# Patient Record
Sex: Female | Born: 1945 | Race: White | Hispanic: No | Marital: Married | Smoking: Never smoker
Health system: Southern US, Community
[De-identification: ages and names within clinical notes are randomized; demographics above are authoritative.]

## PROBLEM LIST (undated history)

## (undated) DIAGNOSIS — F329 Major depressive disorder, single episode, unspecified: Secondary | ICD-10-CM

## (undated) DIAGNOSIS — F32A Depression, unspecified: Secondary | ICD-10-CM

## (undated) DIAGNOSIS — I10 Essential (primary) hypertension: Secondary | ICD-10-CM

## (undated) DIAGNOSIS — F419 Anxiety disorder, unspecified: Secondary | ICD-10-CM

## (undated) DIAGNOSIS — K635 Polyp of colon: Secondary | ICD-10-CM

## (undated) DIAGNOSIS — E785 Hyperlipidemia, unspecified: Secondary | ICD-10-CM

## (undated) DIAGNOSIS — M8580 Other specified disorders of bone density and structure, unspecified site: Secondary | ICD-10-CM

## (undated) DIAGNOSIS — L719 Rosacea, unspecified: Secondary | ICD-10-CM

## (undated) DIAGNOSIS — G250 Essential tremor: Secondary | ICD-10-CM

## (undated) HISTORY — DX: Depression, unspecified: F32.A

## (undated) HISTORY — DX: Essential (primary) hypertension: I10

## (undated) HISTORY — PX: APPENDECTOMY: SHX54

## (undated) HISTORY — DX: Hyperlipidemia, unspecified: E78.5

## (undated) HISTORY — DX: Other specified disorders of bone density and structure, unspecified site: M85.80

## (undated) HISTORY — DX: Essential tremor: G25.0

## (undated) HISTORY — DX: Rosacea, unspecified: L71.9

## (undated) HISTORY — DX: Anxiety disorder, unspecified: F41.9

## (undated) HISTORY — PX: POLYPECTOMY: SHX149

## (undated) HISTORY — DX: Polyp of colon: K63.5

## (undated) HISTORY — DX: Major depressive disorder, single episode, unspecified: F32.9

## (undated) HISTORY — PX: TUBAL LIGATION: SHX77

---

## 2000-02-18 ENCOUNTER — Other Ambulatory Visit: Admission: RE | Admit: 2000-02-18 | Discharge: 2000-02-18 | Payer: Self-pay | Admitting: Family Medicine

## 2000-12-07 ENCOUNTER — Other Ambulatory Visit: Admission: RE | Admit: 2000-12-07 | Discharge: 2000-12-07 | Payer: Self-pay | Admitting: Family Medicine

## 2002-04-18 ENCOUNTER — Other Ambulatory Visit: Admission: RE | Admit: 2002-04-18 | Discharge: 2002-04-18 | Payer: Self-pay | Admitting: Family Medicine

## 2002-06-04 ENCOUNTER — Encounter (INDEPENDENT_AMBULATORY_CARE_PROVIDER_SITE_OTHER): Payer: Self-pay | Admitting: Specialist

## 2002-06-04 ENCOUNTER — Inpatient Hospital Stay (HOSPITAL_COMMUNITY): Admission: EM | Admit: 2002-06-04 | Discharge: 2002-06-11 | Payer: Self-pay | Admitting: Emergency Medicine

## 2002-06-04 ENCOUNTER — Encounter: Payer: Self-pay | Admitting: Emergency Medicine

## 2003-06-19 ENCOUNTER — Other Ambulatory Visit: Admission: RE | Admit: 2003-06-19 | Discharge: 2003-06-19 | Payer: Self-pay | Admitting: Family Medicine

## 2004-05-12 ENCOUNTER — Other Ambulatory Visit: Admission: RE | Admit: 2004-05-12 | Discharge: 2004-05-12 | Payer: Self-pay | Admitting: Family Medicine

## 2005-02-16 ENCOUNTER — Other Ambulatory Visit: Admission: RE | Admit: 2005-02-16 | Discharge: 2005-02-16 | Payer: Self-pay | Admitting: Family Medicine

## 2005-02-16 ENCOUNTER — Ambulatory Visit: Payer: Self-pay | Admitting: Family Medicine

## 2005-03-25 ENCOUNTER — Ambulatory Visit: Payer: Self-pay | Admitting: Family Medicine

## 2005-04-13 ENCOUNTER — Ambulatory Visit: Payer: Self-pay

## 2005-12-31 ENCOUNTER — Ambulatory Visit: Payer: Self-pay | Admitting: Internal Medicine

## 2007-02-22 ENCOUNTER — Ambulatory Visit: Payer: Self-pay | Admitting: Internal Medicine

## 2007-02-22 ENCOUNTER — Encounter: Payer: Self-pay | Admitting: Internal Medicine

## 2007-02-22 ENCOUNTER — Other Ambulatory Visit: Admission: RE | Admit: 2007-02-22 | Discharge: 2007-02-22 | Payer: Self-pay | Admitting: Internal Medicine

## 2007-02-22 DIAGNOSIS — L719 Rosacea, unspecified: Secondary | ICD-10-CM | POA: Insufficient documentation

## 2007-02-22 DIAGNOSIS — G250 Essential tremor: Secondary | ICD-10-CM

## 2007-02-22 DIAGNOSIS — F329 Major depressive disorder, single episode, unspecified: Secondary | ICD-10-CM

## 2007-02-22 LAB — CONVERTED CEMR LAB
ALT: 18 units/L (ref 0–40)
AST: 25 units/L (ref 0–37)
Albumin: 4.1 g/dL (ref 3.5–5.2)
Alkaline Phosphatase: 81 units/L (ref 39–117)
BUN: 13 mg/dL (ref 6–23)
Basophils Relative: 1 % (ref 0.0–1.0)
Bilirubin Urine: NEGATIVE
Bilirubin, Direct: 0.1 mg/dL (ref 0.0–0.3)
CO2: 32 meq/L (ref 19–32)
Calcium: 9.8 mg/dL (ref 8.4–10.5)
Cholesterol: 229 mg/dL (ref 0–200)
Creatinine, Ser: 0.7 mg/dL (ref 0.4–1.2)
Direct LDL: 153.1 mg/dL
Eosinophils Absolute: 0.1 10*3/uL (ref 0.0–0.6)
Eosinophils Relative: 2 % (ref 0.0–5.0)
GFR calc Af Amer: 110 mL/min
GFR calc non Af Amer: 91 mL/min
Glucose, Bld: 83 mg/dL (ref 70–99)
HCT: 36.5 % (ref 36.0–46.0)
HDL: 57.9 mg/dL (ref >39.0)
Hemoglobin, Urine: NEGATIVE
Ketones, ur: NEGATIVE mg/dL
Leukocytes, UA: NEGATIVE
Lymphocytes Relative: 26.6 % (ref 12.0–46.0)
MCHC: 34.7 g/dL (ref 30.0–36.0)
MCV: 84.5 fL (ref 78.0–100.0)
Monocytes Relative: 9.4 % (ref 3.0–11.0)
Neutro Abs: 2.6 10*3/uL (ref 1.4–7.7)
Neutrophils Relative %: 61 % (ref 43.0–77.0)
Nitrite: NEGATIVE
Platelets: 278 10*3/uL (ref 150–400)
Protein, ur: NEGATIVE mg/dL
RDW: 11.7 % (ref 11.5–14.6)
Sodium: 143 meq/L (ref 135–145)
Specific Gravity, Urine: 1.014 (ref 1.005–1.03)
TSH: 1.48 microintl units/mL (ref 0.35–5.50)
Total Bilirubin: 0.9 mg/dL (ref 0.3–1.2)
Triglycerides: 100 mg/dL (ref 0–149)
Urine Glucose: NEGATIVE mg/dL
Urobilinogen, UA: 0.2 (ref 0.0–1.0)
VLDL: 20 mg/dL (ref 0–40)
WBC, UA: NONE SEEN cells/hpf (ref <3)
WBC: 4.2 10*3/uL — ABNORMAL LOW (ref 4.5–10.5)
pH: 6.5 (ref 5.0–8.0)

## 2007-03-31 ENCOUNTER — Encounter: Payer: Self-pay | Admitting: Internal Medicine

## 2007-04-28 ENCOUNTER — Encounter: Payer: Self-pay | Admitting: Internal Medicine

## 2008-06-20 ENCOUNTER — Encounter: Payer: Self-pay | Admitting: Internal Medicine

## 2008-07-04 ENCOUNTER — Encounter (INDEPENDENT_AMBULATORY_CARE_PROVIDER_SITE_OTHER): Payer: Self-pay | Admitting: *Deleted

## 2008-07-19 ENCOUNTER — Encounter: Payer: Self-pay | Admitting: Family Medicine

## 2008-07-19 ENCOUNTER — Ambulatory Visit: Payer: Self-pay | Admitting: Internal Medicine

## 2008-07-19 ENCOUNTER — Other Ambulatory Visit: Admission: RE | Admit: 2008-07-19 | Discharge: 2008-07-19 | Payer: Self-pay | Admitting: Internal Medicine

## 2008-07-19 ENCOUNTER — Encounter: Payer: Self-pay | Admitting: Internal Medicine

## 2008-07-19 DIAGNOSIS — G4700 Insomnia, unspecified: Secondary | ICD-10-CM

## 2008-07-22 LAB — CONVERTED CEMR LAB
AST: 21 units/L (ref 0–37)
BUN: 17 mg/dL (ref 6–23)
CO2: 30 meq/L (ref 19–32)
Calcium: 10.1 mg/dL (ref 8.4–10.5)
Chloride: 108 meq/L (ref 96–112)
Cholesterol: 267 mg/dL (ref 0–200)
Creatinine, Ser: 0.8 mg/dL (ref 0.4–1.2)
Direct LDL: 150.8 mg/dL
GFR calc Af Amer: 93 mL/min
HDL: 50.7 mg/dL (ref >39.0)
Hemoglobin: 13.7 g/dL (ref 12.0–15.0)
Potassium: 4.4 meq/L (ref 3.5–5.1)
Sodium: 143 meq/L (ref 135–145)
TSH: 1.37 microintl units/mL (ref 0.35–5.50)
Total CHOL/HDL Ratio: 5.3
Triglycerides: 60 mg/dL (ref 0–149)

## 2008-07-23 ENCOUNTER — Encounter (INDEPENDENT_AMBULATORY_CARE_PROVIDER_SITE_OTHER): Payer: Self-pay | Admitting: *Deleted

## 2008-07-23 LAB — CONVERTED CEMR LAB: Vit D, 1,25-Dihydroxy: 35 (ref 30–89)

## 2008-07-30 ENCOUNTER — Encounter (INDEPENDENT_AMBULATORY_CARE_PROVIDER_SITE_OTHER): Payer: Self-pay | Admitting: *Deleted

## 2008-08-09 ENCOUNTER — Ambulatory Visit: Payer: Self-pay | Admitting: Internal Medicine

## 2008-08-09 ENCOUNTER — Encounter: Payer: Self-pay | Admitting: Internal Medicine

## 2008-09-12 ENCOUNTER — Telehealth (INDEPENDENT_AMBULATORY_CARE_PROVIDER_SITE_OTHER): Payer: Self-pay | Admitting: *Deleted

## 2008-10-21 ENCOUNTER — Ambulatory Visit: Payer: Self-pay | Admitting: Internal Medicine

## 2008-10-25 ENCOUNTER — Encounter (INDEPENDENT_AMBULATORY_CARE_PROVIDER_SITE_OTHER): Payer: Self-pay | Admitting: *Deleted

## 2008-10-25 ENCOUNTER — Telehealth (INDEPENDENT_AMBULATORY_CARE_PROVIDER_SITE_OTHER): Payer: Self-pay | Admitting: *Deleted

## 2008-10-25 LAB — CONVERTED CEMR LAB
Direct LDL: 152.3 mg/dL
HDL: 54.4 mg/dL (ref >39.0)
VLDL: 10 mg/dL (ref 0–40)

## 2009-04-30 ENCOUNTER — Emergency Department (HOSPITAL_COMMUNITY): Admission: EM | Admit: 2009-04-30 | Discharge: 2009-05-01 | Payer: Self-pay | Admitting: Emergency Medicine

## 2009-05-16 ENCOUNTER — Ambulatory Visit: Payer: Self-pay | Admitting: Internal Medicine

## 2009-05-16 DIAGNOSIS — R51 Headache: Secondary | ICD-10-CM

## 2009-05-16 DIAGNOSIS — R519 Headache, unspecified: Secondary | ICD-10-CM | POA: Insufficient documentation

## 2009-05-27 ENCOUNTER — Telehealth: Payer: Self-pay | Admitting: Internal Medicine

## 2009-10-06 ENCOUNTER — Telehealth (INDEPENDENT_AMBULATORY_CARE_PROVIDER_SITE_OTHER): Payer: Self-pay | Admitting: *Deleted

## 2009-10-10 ENCOUNTER — Encounter (INDEPENDENT_AMBULATORY_CARE_PROVIDER_SITE_OTHER): Payer: Self-pay | Admitting: *Deleted

## 2009-11-17 ENCOUNTER — Telehealth (INDEPENDENT_AMBULATORY_CARE_PROVIDER_SITE_OTHER): Payer: Self-pay | Admitting: *Deleted

## 2009-11-17 ENCOUNTER — Ambulatory Visit: Payer: Self-pay | Admitting: Internal Medicine

## 2009-11-27 ENCOUNTER — Encounter: Payer: Self-pay | Admitting: Internal Medicine

## 2009-11-27 ENCOUNTER — Telehealth: Payer: Self-pay | Admitting: Internal Medicine

## 2009-11-27 LAB — HM MAMMOGRAPHY: HM Mammogram: NORMAL

## 2009-11-28 ENCOUNTER — Encounter: Payer: Self-pay | Admitting: Internal Medicine

## 2009-12-15 ENCOUNTER — Other Ambulatory Visit: Admission: RE | Admit: 2009-12-15 | Discharge: 2009-12-15 | Payer: Self-pay | Admitting: Internal Medicine

## 2009-12-15 ENCOUNTER — Ambulatory Visit: Payer: Self-pay | Admitting: Internal Medicine

## 2009-12-15 DIAGNOSIS — M949 Disorder of cartilage, unspecified: Secondary | ICD-10-CM

## 2009-12-15 DIAGNOSIS — M899 Disorder of bone, unspecified: Secondary | ICD-10-CM | POA: Insufficient documentation

## 2009-12-15 LAB — CONVERTED CEMR LAB: Vit D, 25-Hydroxy: 71 ng/mL (ref 30–89)

## 2009-12-16 ENCOUNTER — Encounter (INDEPENDENT_AMBULATORY_CARE_PROVIDER_SITE_OTHER): Payer: Self-pay | Admitting: *Deleted

## 2009-12-18 LAB — CONVERTED CEMR LAB
ALT: 13 units/L (ref 0–35)
AST: 20 units/L (ref 0–37)
BUN: 16 mg/dL (ref 6–23)
Basophils Absolute: 0 10*3/uL (ref 0.0–0.1)
Basophils Relative: 0.1 % (ref 0.0–3.0)
CO2: 29 meq/L (ref 19–32)
Calcium: 10 mg/dL (ref 8.4–10.5)
Chloride: 105 meq/L (ref 96–112)
Cholesterol: 227 mg/dL — ABNORMAL HIGH (ref 0–200)
Creatinine, Ser: 0.7 mg/dL (ref 0.4–1.2)
Direct LDL: 156.9 mg/dL
Eosinophils Absolute: 0.1 10*3/uL (ref 0.0–0.7)
Eosinophils Relative: 2.4 % (ref 0.0–5.0)
GFR calc non Af Amer: 89.62 mL/min (ref >60)
Glucose, Bld: 88 mg/dL (ref 70–99)
HCT: 41.9 % (ref 36.0–46.0)
HDL: 48.6 mg/dL (ref >39.00)
Hemoglobin: 13.7 g/dL (ref 12.0–15.0)
Lymphocytes Relative: 18.3 % (ref 12.0–46.0)
Lymphs Abs: 0.9 10*3/uL (ref 0.7–4.0)
MCHC: 32.7 g/dL (ref 30.0–36.0)
MCV: 93.8 fL (ref 78.0–100.0)
Monocytes Absolute: 0.5 10*3/uL (ref 0.1–1.0)
Monocytes Relative: 10.9 % (ref 3.0–12.0)
Neutro Abs: 3.4 10*3/uL (ref 1.4–7.7)
Neutrophils Relative %: 68.3 % (ref 43.0–77.0)
Pap Smear: NEGATIVE
Platelets: 206 10*3/uL (ref 150.0–400.0)
Potassium: 4.3 meq/L (ref 3.5–5.1)
RBC: 4.46 M/uL (ref 3.87–5.11)
RDW: 12.9 % (ref 11.5–14.6)
Sodium: 141 meq/L (ref 135–145)
TSH: 2.76 microintl units/mL (ref 0.35–5.50)
Total CHOL/HDL Ratio: 5
Triglycerides: 106 mg/dL (ref 0.0–149.0)
VLDL: 21.2 mg/dL (ref 0.0–40.0)
WBC: 4.9 10*3/uL (ref 4.5–10.5)

## 2009-12-26 ENCOUNTER — Ambulatory Visit: Payer: Self-pay | Admitting: Internal Medicine

## 2009-12-29 ENCOUNTER — Encounter (INDEPENDENT_AMBULATORY_CARE_PROVIDER_SITE_OTHER): Payer: Self-pay | Admitting: *Deleted

## 2009-12-29 LAB — CONVERTED CEMR LAB: Fecal Occult Bld: NEGATIVE

## 2010-01-23 ENCOUNTER — Ambulatory Visit: Payer: Self-pay | Admitting: Internal Medicine

## 2010-01-29 LAB — CONVERTED CEMR LAB
AST: 30 units/L (ref 0–37)
Cholesterol: 125 mg/dL (ref 0–200)
LDL Cholesterol: 62 mg/dL (ref 0–99)

## 2010-07-06 ENCOUNTER — Telehealth (INDEPENDENT_AMBULATORY_CARE_PROVIDER_SITE_OTHER): Payer: Self-pay | Admitting: *Deleted

## 2010-07-08 ENCOUNTER — Ambulatory Visit: Payer: Self-pay | Admitting: Internal Medicine

## 2010-07-08 DIAGNOSIS — E785 Hyperlipidemia, unspecified: Secondary | ICD-10-CM

## 2010-07-10 LAB — CONVERTED CEMR LAB: ALT: 32 units/L (ref 0–35)

## 2010-12-24 NOTE — Letter (Signed)
Summary: Cancer Screening/[facility] Personalized Risk Profile  Cancer Screening/[facility] Personalized Risk Profile   Imported By: Lanelle Bal 12/19/2009 12:55:16  _____________________________________________________________________  External Attachment:    Type:   Image     Comment:   External Document

## 2010-12-24 NOTE — Progress Notes (Signed)
Summary: ear pain  Phone Note Call from Patient Call back at Home Phone [phone] Call back at [phone]   Summary of Call: Patient was seen 12/27 for OM.  Rx'd cipro gtts & amoxil.  She has finished amoxil.  Having a hard time getting gtts into ear because of swelling.  Still having A LOT of ear pain. Does not feel better. Afebrile.  Now has a lot of pressure in her head & has bloody nasal d/ c.  Using Advil but not helping relieve swelling or pain. RA - [place]  Initial call taken by: [name],  November 27, 2009 10:35 AM  Follow-up for Phone Call        call a [facility] see if ENT can see her today or  tomorrow, if not OV here in AM Follow-up by: [facility] E. Tyshawna Alarid MD,  November 27, 2009 1:22 PM  Additional Follow-up for Phone Call Additional follow up Details #1::        discussed with pt [name]  November 27, 2009 1:31 PM     New/Updated Medications: ZITHROMAX Z-PAK 250 MG TABS (AZITHROMYCIN) as directed Prescriptions: ZITHROMAX Z-PAK 250 MG TABS (AZITHROMYCIN) as directed  #1 x 0   Entered by:   [name]   Authorized by:   Nolon Rod. Seraphine Gudiel MD   Signed by:   [name] on 11/27/2009   Method used:   Electronically to        [id number] (retail)       [address]       [place], [place]  [address]       Ph: [phone] or [phone]       Fax: [phone]   RxID:   [id number]

## 2010-12-24 NOTE — Letter (Signed)
Summary: Results Follow up Letter  [facility] [place]  [address] [place], [place] [address]   Phone: [phone]  Fax: [phone]    12/29/2009 MRN: [id number]  Carol Wilson [address] [place], [place]  [address]  Dear Ms. Sessums,  The following are the results of your recent test(s):  Test         Result    Pap Smear:        Normal _____  Not Normal _____ Comments: ______________________________________________________ Cholesterol: LDL(Bad cholesterol):         Your goal is less than:         HDL (Good cholesterol):       Your goal is more than: Comments:  ______________________________________________________ Mammogram:        Normal _____  Not Normal _____ Comments:  ___________________________________________________________________ Hemoccult:        Normal __x___  Not normal _______ Comments:    ______stool test was negative for blood _______________________________________________________________ Other Tests:    We routinely do not discuss normal results over the telephone.  If you desire a copy of the results, or you have any questions about this information we can discuss them at your next office visit.   Sincerely,

## 2010-12-24 NOTE — Assessment & Plan Note (Signed)
Summary: follow per dr. paz//kn   Vital Signs:  Patient profile:   65 year old female Menstrual status:  postmenopausal Weight:      140.13 pounds Pulse rate:   64 / minute Pulse rhythm:   regular BP sitting:   118 / 62  (left arm) Cuff size:   large  Vitals Entered By: Army Fossa CMA (July 08, 2010 10:59 AM) CC: ROV: Fasting   History of Present Illness: routine office visit Feeling well  Current Medications (verified): 1)  Propranolol Hcl 10 Mg Tabs (Propranolol Hcl) .... Up To 2 By Mouth Once Daily 2)  Metrogel 1 % Gel (Metronidazole) .... Apply Once Daily 3)  Zoloft 100 Mg Tabs (Sertraline Hcl) .Marland Kitchen.. 1 By Mouth Once Daily 4)  Alprazolam 0.5 Mg Tabs (Alprazolam) .... 1/2 To 1 By Mouth At Bedtime As Needed 5)  Simvastatin 40 Mg Tabs (Simvastatin) .Marland Kitchen.. 1 By Mouth At Bedtime  Allergies (verified): No Known Drug Allergies  Past History:  Past Medical History: Anxiety Depression Familial Tremor Osteopenia Rosacea Hyperlipidemia  Past Surgical History: Reviewed history from 02/22/2007 and no changes required. Appendectomy Tubal ligation  Social History: Reviewed history from 12/15/2009 and no changes required. has lost two daughters (one to lung Ca, one with a "TB germ") Married tobacco--no ETOH--rarely  Review of Systems       she started simvastatin few months ago, is working very well She denies myalgias or arthralgias Her diet has improved, per her own scales she has lost 18 pounds She is exercising more She was seen with otitis media , she is now essentially asymptomatic, no discharge, hearing is normal. For the last few days has felt "some water in the ear" but again other than that she is asymptomatic. No fever or sinus congestion  Physical Exam  General:  alert and well-developed.   Ears:  R ear normal.   Left ear, TM seems healthy, slightly bulge?Marland Kitchen No red no discharge Lungs:  normal respiratory effort, no intercostal retractions, no  accessory muscle use, and normal breath sounds.   Heart:  normal rate, regular rhythm, no murmur, and no gallop.     Impression & Recommendations:  Problem # 1:  HYPERLIPIDEMIA (ICD-272.4) the patient has a family history of heart disease She was started on simvastatin and responded very well, no side effects LDL is now 62, LDL goal around 100 Plan: Decreasing simvastatin to 20 mg Check LFTs today Also add aspirin for primary prevention of CAD  Her updated medication list for this problem includes:    Simvastatin 40 Mg Tabs (Simvastatin) .Marland Kitchen... 1/2 tablet by mouth at bedtime  Labs Reviewed: SGOT: 30 (01/23/2010)   SGPT: 28 (01/23/2010)   HDL:51.80 (01/23/2010), 48.60 (12/15/2009)  LDL:62 (01/23/2010), DEL (13/06/6577)  Chol:125 (01/23/2010), 227 (12/15/2009)  Trig:57.0 (01/23/2010), 106.0 (12/15/2009)  Orders: Venipuncture (46962) TLB-ALT (SGPT) (84460-ALT) TLB-AST (SGOT) (84450-SGOT)  Problem # 2:  OTITIS MEDIA, PURULENT, ACUTE (ICD-382.00) symptoms essentially resolved, a "feeling of water" in the ear for few days. Asked patient to let me know if that is not going away  Her updated medication list for this problem includes:    Aspirin 81 Mg Tbec (Aspirin) ..... One by mouth daily  Complete Medication List: 1)  Propranolol Hcl 10 Mg Tabs (Propranolol hcl) .... Up to 2 by mouth once daily 2)  Metrogel 1 % Gel (Metronidazole) .... Apply once daily 3)  Zoloft 100 Mg Tabs (Sertraline hcl) .Marland Kitchen.. 1 by mouth once daily 4)  Alprazolam 0.5 Mg Tabs (  Alprazolam) .... 1/2 to 1 by mouth at bedtime as needed 5)  Simvastatin 40 Mg Tabs (Simvastatin) .... 1/2 tablet by mouth at bedtime 6)  Aspirin 81 Mg Tbec (Aspirin) .... One by mouth daily  Patient Instructions: 1)  Please schedule a follow-up appointment in 5 months, fasting, yearly checkup

## 2010-12-24 NOTE — Consult Note (Signed)
Summary: [facility]  [facility]   Imported By: Lanelle Bal 12/10/2009 12:51:21  _____________________________________________________________________  External Attachment:    Type:   Image     Comment:   External Document

## 2010-12-24 NOTE — Progress Notes (Signed)
Summary: appt  Phone Note Outgoing Call   Summary of Call: Pt is due for a f/u visit with Dr. Drue Novel. Army Fossa CMA  July 06, 2010 8:20 AM   Follow-up for Phone Call        pt has an appt 07/08/10.Karoline Caldwell Negrete  July 06, 2010 11:29 AM

## 2010-12-24 NOTE — Assessment & Plan Note (Signed)
Summary: Carol Wilson   Vital Signs:  Patient profile:   65 year old female Menstrual status:  postmenopausal Height:      65 inches Weight:      150.4 pounds BMI:     25.12 Pulse rate:   76 / minute BP sitting:   118 / 60  Vitals Entered By: [name] (December 15, 2009 9:03 AM) CC: cpx - fasting, ptp Is Patient Diabetic? No     Menstrual Status postmenopausal Last PAP Result NEGATIVE FOR INTRAEPITHELIAL LESIONS OR MALIGNANCY.   History of Present Illness: CPX   chronic med. problems Anxiety, Depression-- symptoms well controlled, needs RF ; she held  her meds x a while, didn't feel good  Familial Tremor-- on propranolol 1 a day, does help.  Osteopenia-- on fosamax , good medication compliance. 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 Up To 2 By Mouth Once Daily 2)  Metrogel 1 % Gel (Metronidazole) .... Apply Once Daily 3)  Zoloft 100 Mg Tabs (Sertraline Hcl) .Marland Kitchen.. 1 By Mouth Once Daily 4)  Alprazolam 0.5 Mg Tabs (Alprazolam) .... 1/2 To 1 By Mouth At Bedtime As Needed 5)  Fosamax 70 Mg Tabs (Alendronate Sodium) .... Take One Tablet Weekly  Allergies (verified): No Known Drug Allergies  Past History:  Past Medical History: Anxiety Depression Familial Tremor Osteopenia Rosacea  Past Surgical History: Reviewed history from 02/22/2007 and no changes required. Appendectomy Tubal ligation  Family History: lung cancer-- daughter  DM - no CAD - M, F HTN - no stroke - no colon Ca - no breast Ca - no M - deceased MI age 57y/o F - deceased MI age 67y/o  Social History: has lost two daughters  (one to lung Ca, one with a "TB germ") Married tobacco--no ETOH--rarely  Caffeine use/day:  4 Does Patient Exercise:  yes  Review of Systems General:  Denies fatigue, fever, and weight loss. CV:  Denies chest pain or discomfort, palpitations, and swelling of feet. Resp:  Denies cough and shortness of breath. GI:  Denies bloody stools, diarrhea, nausea, and vomiting. GU:  no vag d/c or bleed SBE sometimes , no unusual findings .  Physical Exam  General:  alert, well-developed, and well-nourished.   Ears:  L ear normal.   R TM slightly  retracted, no d/c no red  Breasts:  No mass, nodules, thickening, tenderness, bulging, retraction, inflamation, nipple discharge or skin changes noted.  no LADs Lungs:  normal respiratory effort, no intercostal retractions, no accessory muscle use, and normal breath sounds.   Heart:  normal rate, regular rhythm, and no murmur.   Abdomen:  soft, non-tender, no distention, no masses, no guarding, and no rigidity.   Genitalia:  normal introitus, no vaginal discharge, no vaginal or cervical lesions, and no friaility or hemorrhage.  slightly pale mucosae Extremities:  no pretibial edema bilaterally  Psych:  Cognition and judgment appear intact. Alert and cooperative with normal attention span and concentration.  not anxious appearing and not depressed appearing.     Impression & Recommendations:  Problem # 1:  PREVENTIVE HEALTH CARE (ICD-V70.0) Td 08 had a flu shot  discussed benefits of  shingles shot -- provided one today  last Pap Smear:  normal  (07/19/2008) repeat PAP today breast exam normal  Mammogram:  11/27/2009 (per pt)--- normal  she had a normal colonoscopy 2002  No family history colon cancer iFOB provided  patient education: diet-exercise,  printed material provided    Orders: Venipuncture (82956) TLB-ALT (SGPT) (84460-ALT) TLB-AST (SGOT) (84450-SGOT) TLB-BMP (Basic Metabolic Panel-BMET) (80048-METABOL) TLB-CBC Platelet -  w/Differential (85025-CBCD) TLB-Lipid Panel (80061-LIPID) TLB-TSH (Thyroid Stimulating Hormone) (84443-TSH)  Problem # 2:  OSTEOPENIA (ICD-733.90)  Bone Density:   Date:  08/09/2008----------- osteopenia Next Due:  08/2010 continue Ca and Vit D   Her updated medication list for this problem includes:    Fosamax 70 Mg Tabs (Alendronate sodium) .Marland Kitchen... 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 1 by mouth once daily    Alprazolam 0.5 Mg Tabs (Alprazolam) .Marland Kitchen... 1/2 to 1 by mouth at bedtime as needed  Problem # 5:  OTITIS MEDIA, PURULENT, ACUTE (ICD-382.00) pain resolved, s/p ENT eval still w/  diff hearing, declined to re-refer to another ENT b/c hearing is caming back slowly rec to call if not back to normal in 1 month  Problem # 6:  in addition to CPX we spent > 15 min on chronic medical issues   Complete Medication List: 1)  Propranolol Hcl 10 Mg Tabs (Propranolol hcl) .... Up to 2 by mouth once daily 2)  Metrogel 1 % Gel (Metronidazole) .... Apply once daily 3)  Zoloft 100 Mg Tabs (Sertraline hcl) .Marland Kitchen.. 1 by mouth once daily 4)  Alprazolam 0.5 Mg Tabs (Alprazolam) .... 1/2 to 1 by mouth at bedtime as needed 5)  Fosamax 70 Mg Tabs (Alendronate sodium) .... Take one tablet weekly  Other Orders: Zoster (Shingles) Vaccine Live [phone]) Admin 1st Vaccine (52841)  Patient Instructions: 1)  Please schedule a follow-up appointment in 6 months .  Prescriptions: FOSAMAX 70 MG TABS (ALENDRONATE SODIUM) take one tablet weekly  #12 x 3   Entered by:   [name]   Authorized by:   Nolon Rod. Keifer Habib MD   Signed by:   [name] on 12/15/2009   Method used:    Print then Give to Patient   RxID:   [id number] ALPRAZOLAM 0.5 MG TABS (ALPRAZOLAM) 1/2 to 1 by mouth at bedtime as needed  #90 x 1   Entered by:   [name]   Authorized by:   Nolon Rod. Shoshana Johal MD   Signed by:   [name] on 12/15/2009   Method used:   Print then Give to Patient   RxID:   [id number] ZOLOFT 100 MG TABS (SERTRALINE HCL) 1 by mouth once daily  #90 x 3   Entered by:   [name]   Authorized by:   Nolon Rod. Jennfer Gassen MD   Signed by:   [name] on 12/15/2009   Method used:   Print then Give to Patient   RxID:   [id number] METROGEL 1 % GEL (METRONIDAZOLE) apply once daily  #3 mo  supply x 3   Entered by:   [name]   Authorized by:   Nolon Rod. Bernette Seeman MD   Signed by:   [name] on 12/15/2009   Method used:   Print then Give to Patient   RxID:   [id number] PROPRANOLOL HCL 10 MG TABS (PROPRANOLOL HCL) up to 2 by mouth once daily  #180 x 3   Entered by:   [name]   Authorized by:   Nolon Rod. Arma Reining MD   Signed by:   [name] on 12/15/2009   Method used:   Print then Give to Patient   RxID:   [id number] FOSAMAX 70 MG TABS (ALENDRONATE SODIUM) take one tablet weekly  #4 Tablet x 11   Entered by:   [name]   Authorized by:   Nolon Rod. Alfhild Partch MD   Signed by:   [name] on 12/15/2009   Method used:   Print then Give to Patient   RxID:   [id number] ALPRAZOLAM 0.5 MG TABS (ALPRAZOLAM) 1/2 to 1 by mouth at bedtime as needed  #30 x 5   Entered by:   [name]   Authorized by:   Nolon Rod. Yocheved Depner MD   Signed by:   [name] on 12/15/2009   Method used:   Print then Give to Patient   RxID:   [id number] ZOLOFT 100 MG TABS (SERTRALINE HCL) 1 by mouth once daily  #30 Tablet x 11   Entered by:   [name]   Authorized by:   Nolon Rod. Rojean Ige MD   Signed by:   [name] on 12/15/2009   Method used:   Print then Give to Patient   RxID:   [id number] METROGEL 1 % GEL (METRONIDAZOLE) apply once daily  #60gms x 11   Entered by:    [name]   Authorized by:   Nolon Rod. Kalyani Maeda MD   Signed by:   [name] on 12/15/2009   Method used:   Print then Give to Patient   RxID:   [id number] PROPRANOLOL HCL 10 MG TABS (PROPRANOLOL HCL) up to 2 by mouth once daily  #60 Tablet x 11   Entered by:   [name]   Authorized by:   Nolon Rod. Bronsyn Shappell MD   Signed by:   [name] on 12/15/2009   Method used:   Print then Give to Patient   RxID:   [id number]    Immunization History:  Influenza Immunization History:    Influenza:  historical (08/22/2009)  Immunizations Administered:  Zostavax # 1:    Vaccine Type: Zostavax    Site: right deltoid    Dose: 0.69ml    Route: [place]    Given by: [name]    Exp. Date: 12/19/2010    Lot #: [id number]    Preventive Care Screening  Bone Density:    Date:  08/09/2008    Next Due:  08/2010    Results:  abnormal - osteopenia  Mammogram:    Date:  11/27/2009    Next Due:  11/2010    Results:  normal  Prior Values:    Pap Smear:  NEGATIVE FOR INTRAEPITHELIAL LESIONS OR MALIGNANCY. (07/19/2008)    Mammogram:  normal (05/22/2008)    Colonoscopy:  Diverticulosis (02/09/2001)    Bone Density:  borderline osteopenia (06/29/2000)    Last Tetanus Booster:  Tdap (02/22/2007)    Dexa Interp:  borderline osteopenia (06/29/2000)

## 2010-12-24 NOTE — Letter (Signed)
Summary: [facility]: Immunoassay Fecal Occult Blood (iFOB) Order Form  [facility] [place]  [address] [place], [place] [address]   Phone: [phone]  Fax: [phone]      [facility]: Immunoassay Fecal Occult Blood (iFOB) Order Form   December 16, 2009 MRN: [id number]   Carol Wilson December 05, 1945   Physicican Name:_______paz__________________  Diagnosis Code:_____v76.51_____________________      [name]

## 2011-01-13 ENCOUNTER — Ambulatory Visit (INDEPENDENT_AMBULATORY_CARE_PROVIDER_SITE_OTHER): Payer: Self-pay | Admitting: Internal Medicine

## 2011-01-13 ENCOUNTER — Encounter: Payer: Self-pay | Admitting: Internal Medicine

## 2011-01-13 DIAGNOSIS — R51 Headache: Secondary | ICD-10-CM

## 2011-01-18 ENCOUNTER — Encounter (INDEPENDENT_AMBULATORY_CARE_PROVIDER_SITE_OTHER): Payer: Self-pay | Admitting: *Deleted

## 2011-01-18 ENCOUNTER — Telehealth: Payer: Self-pay | Admitting: Internal Medicine

## 2011-01-18 ENCOUNTER — Telehealth (INDEPENDENT_AMBULATORY_CARE_PROVIDER_SITE_OTHER): Payer: Self-pay | Admitting: *Deleted

## 2011-01-18 ENCOUNTER — Encounter: Payer: Self-pay | Admitting: Internal Medicine

## 2011-01-18 ENCOUNTER — Ambulatory Visit (INDEPENDENT_AMBULATORY_CARE_PROVIDER_SITE_OTHER): Payer: BC Managed Care – PPO | Admitting: Internal Medicine

## 2011-01-18 DIAGNOSIS — R51 Headache: Secondary | ICD-10-CM

## 2011-01-19 ENCOUNTER — Ambulatory Visit
Admission: RE | Admit: 2011-01-19 | Discharge: 2011-01-19 | Disposition: A | Discharge status: Home / Self Care | Payer: BC Managed Care – PPO | Source: Ambulatory Visit | Attending: Specialist | Admitting: Specialist

## 2011-01-19 ENCOUNTER — Encounter: Payer: Self-pay | Admitting: Internal Medicine

## 2011-01-19 ENCOUNTER — Other Ambulatory Visit: Payer: Self-pay | Admitting: Specialist

## 2011-01-19 DIAGNOSIS — I7771 Dissection of carotid artery: Secondary | ICD-10-CM

## 2011-01-19 DIAGNOSIS — S15001A Unspecified injury of right carotid artery, initial encounter: Secondary | ICD-10-CM

## 2011-01-19 MED ORDER — GADOBENATE DIMEGLUMINE 529 MG/ML IV SOLN: 20.0000 mL | Freq: Once | INTRAVENOUS | Status: AC | PRN

## 2011-01-19 NOTE — Assessment & Plan Note (Signed)
Summary: Neck pain since yesterday that radiates to pt ear./kb   Vital Signs:  Patient profile:   65 year old female Menstrual status:  postmenopausal Weight:      74.06 pounds BMI:     12.37 Temp:     4 degrees F Pulse rate:   72 / minute Pulse rhythm:   regular BP sitting:   126 / 84  (left arm) Cuff size:   regular  Vitals Entered By: Army Fossa CMA (January 13, 2011 11:49 AM) CC: Pt here c/o sharp pain in left side of neck Comments feels tender not fasting  [facility] [address]    History of Present Illness: pain located at the L mastoid area since yesterday is sharp and cames every 2 minutes for few seconds sometimes worse when turns her head to the R  ROS no injury  no rash no fever or URI s  no dizzines or diplopia, hearing normal no dental pain no UE paresthesias   Current Medications (verified): 1)  Propranolol Hcl 10 Mg Tabs (Propranolol Hcl) .... Up To 2 By Mouth Once Daily. Due For Cpx. 2)  Metrogel 1 % Gel (Metronidazole) .... Apply Once Daily 3)  Zoloft 100 Mg Tabs (Sertraline Hcl) .... 1/2  By Mouth Once Daily 4)  Alprazolam 0.5 Mg Tabs (Alprazolam) .... 1/2 To 1 By Mouth At Bedtime As Needed 5)  Simvastatin 40 Mg Tabs (Simvastatin) .... 1/2 Tablet By Mouth At Bedtime 6)  Aspirin 81 Mg Tbec (Aspirin) .... One By Mouth Daily  Allergies (verified): No Known Drug Allergies  Past History:  Past Medical History: Reviewed history from 07/08/2010 and no changes required. Anxiety Depression Familial Tremor Osteopenia Rosacea Hyperlipidemia  Past Surgical History: Reviewed history from 02/22/2007 and no changes required. Appendectomy Tubal ligation  Social History: Reviewed history from 12/15/2009 and no changes required. has lost two daughters (one to lung Ca, one with a "TB germ") Married tobacco--no ETOH--rarely  Physical Exam  General:  alert and well-developed.   Head:  face symetric, no TTP at sinus area  Ears:  R ear normal  and L ear normal.   Mouth:  no redness or d/c, throat symetric  Neck:  full ROM, no masses, no thyromegaly, no cervical lymphadenopathy, and no neck tenderness.   no tender on eaither mastoid area  Skin:  no rash at neck or scalp   Impression & Recommendations:  Problem # 1:  CEPHALGIA (ICD-784.0) unclear etiology MS pain? neuralgia? early shingles? plan-- observe , see instructions  symptomatic treatment w/ motrin-vicodin     Complete Medication List: 1)  Propranolol Hcl 10 Mg Tabs (Propranolol hcl) .... Up to 2 by mouth once daily. due for cpx. 2)  Metrogel 1 % Gel (Metronidazole) .... Apply once daily 3)  Zoloft 100 Mg Tabs (Sertraline hcl) .... 1/2  by mouth once daily 4)  Alprazolam 0.5 Mg Tabs (Alprazolam) .... 1/2 to 1 by mouth at bedtime as needed 5)  Simvastatin 40 Mg Tabs (Simvastatin) .... 1/2 tablet by mouth at bedtime 6)  Aspirin 81 Mg Tbec (Aspirin) .... One by mouth daily 7)  Hydrocodone-acetaminophen 5-500 Mg Tabs (Hydrocodone-acetaminophen) .Marland Kitchen.. 1 or 2 by mouth every 8 hours as needed pain  Patient Instructions: 1)  motrin 200mg  OTC 2 or 3 every 6 hours as needed , take with food watch for stomach irritation 2)  if still have pain, may use hydrocodone (drowsiness) 3)  call if symptoms severe or no better in 2 days or if  rash-fever-earache Prescriptions: HYDROCODONE-ACETAMINOPHEN 5-500 MG TABS (HYDROCODONE-ACETAMINOPHEN) 1 or 2 by mouth every 8 hours as needed pain  #15 x 0   Entered and Authorized by:   Nolon Rod. Yitty Roads MD   Signed by:   Nolon Rod. Nirvan Laban MD on 01/13/2011   Method used:   Print then Give to Patient   RxID:   [id number]    Orders Added: 1)  Est. Patient Level III [56213]

## 2011-01-26 ENCOUNTER — Telehealth: Payer: Self-pay | Admitting: Internal Medicine

## 2011-01-28 NOTE — Assessment & Plan Note (Signed)
Summary: severe pain--ph   Vital Signs:  Patient profile:   65 year old female Menstrual status:  postmenopausal Weight:      144 pounds Pulse rate:   82 / minute Pulse rhythm:   regular BP sitting:   126 / 88  (left arm) Cuff size:   regular  Vitals Entered By: Army Fossa CMA (January 18, 2011 2:22 PM) CC: c/o pain- just as bad as last week. Comments vicodin not helping unable to sleep [facility] [address] not fasting    History of Present Illness:  follow up from last visit, pain has not improved at all, if anything is worse ( steady  instead of on and off)  the pain is  located in the same place, sharp, worse "even by  light touch"   ROS No headache per se No tinnitus or dizziness No ear discharge No  sore throat No neck pain or upper extremity paresthesias  Current Medications (verified): 1)  Propranolol Hcl 10 Mg Tabs (Propranolol Hcl) .... Up To 2 By Mouth Once Daily. Due For Cpx. 2)  Metrogel 1 % Gel (Metronidazole) .... Apply Once Daily 3)  Zoloft 100 Mg Tabs (Sertraline Hcl) .... 1/2  By Mouth Once Daily 4)  Alprazolam 0.5 Mg Tabs (Alprazolam) .... 1/2 To 1 By Mouth At Bedtime As Needed 5)  Simvastatin 40 Mg Tabs (Simvastatin) .... 1/2 Tablet By Mouth At Bedtime 6)  Aspirin 81 Mg Tbec (Aspirin) .... One By Mouth Daily 7)  Hydrocodone-Acetaminophen 5-500 Mg Tabs (Hydrocodone-Acetaminophen) .Marland Kitchen.. 1 or 2 By Mouth Every 8 Hours As Needed Pain  Allergies (verified): No Known Drug Allergies  Past History:  Past Medical History: Reviewed history from 07/08/2010 and no changes required. Anxiety Depression Familial Tremor Osteopenia Rosacea Hyperlipidemia  Past Surgical History: Reviewed history from 02/22/2007 and no changes required. Appendectomy Tubal ligation  Physical Exam  General:  alert and well-developed.   mild distress due to pain Head:   face is symmetric Ears:  R ear normal and L ear normal.   Nose:   not congested Mouth:  no  redness or d/c, throat symetric  Neck:  full ROM, no masses, no thyromegaly, no cervical lymphadenopathy, and no neck tenderness.   the area of the pain is the left mastoids, moderate soreness to touch, no redness, no skin  lesions, no warm. Skin:   scalp is free of lesions   Impression & Recommendations:  Problem # 1:  CEPHALGIA (ICD-784.0)  most likely her symptoms are from a  neuralgia   she needs further evaluation, referred to neurology ,  will ask to see her this week , patient is in a lot of pain. Trial with Lidoderm patch and Neurontin  Her updated medication list for this problem includes:    Propranolol Hcl 10 Mg Tabs (Propranolol hcl) ..... Up to 2 by mouth once daily. due for cpx.    Aspirin 81 Mg Tbec (Aspirin) ..... One by mouth daily    Hydrocodone-acetaminophen 5-500 Mg Tabs (Hydrocodone-acetaminophen) .Marland Kitchen... 1 or 2 by mouth every 8 hours as needed pain  Orders: Neurology Referral (Neuro)  Complete Medication List: 1)  Propranolol Hcl 10 Mg Tabs (Propranolol hcl) .... Up to 2 by mouth once daily. due for cpx. 2)  Metrogel 1 % Gel (Metronidazole) .... Apply once daily 3)  Zoloft 100 Mg Tabs (Sertraline hcl) .... 1/2  by mouth once daily 4)  Alprazolam 0.5 Mg Tabs (Alprazolam) .... 1/2 to 1 by mouth at bedtime as  needed 5)  Simvastatin 40 Mg Tabs (Simvastatin) .... 1/2 tablet by mouth at bedtime 6)  Aspirin 81 Mg Tbec (Aspirin) .... One by mouth daily 7)  Hydrocodone-acetaminophen 5-500 Mg Tabs (Hydrocodone-acetaminophen) .Marland Kitchen.. 1 or 2 by mouth every 8 hours as needed pain 8)  Lidoderm 5 % Ptch (Lidocaine) .... Apply 1 patch every 12 hours 9)  Gabapentin 300 Mg Caps (Gabapentin) .Marland Kitchen.. 1 by mouth at bedtime Prescriptions: HYDROCODONE-ACETAMINOPHEN 5-500 MG TABS (HYDROCODONE-ACETAMINOPHEN) 1 or 2 by mouth every 8 hours as needed pain  #20 x 0   Entered and Authorized by:   Nolon Rod. Paz MD   Signed by:   Nolon Rod. Paz MD on 01/18/2011   Method used:   Print then Give to  Patient   RxID:   [id number] GABAPENTIN 300 MG CAPS (GABAPENTIN) 1 by mouth at bedtime  #30 x 0   Entered and Authorized by:   Nolon Rod. Paz MD   Signed by:   Nolon Rod. Paz MD on 01/18/2011   Method used:   Print then Give to Patient   RxID:   [id number] LIDODERM 5 % PTCH (LIDOCAINE) apply 1 patch every 12 hours  #3 boxes x 0   Entered and Authorized by:   Elita Quick E. Paz MD   Signed by:   Nolon Rod. Paz MD on 01/18/2011   Method used:   Print then Give to Patient   RxID:   [id number]    Orders Added: 1)  Neurology Referral [Neuro] 2)  Est. Patient Level III [53664]

## 2011-01-28 NOTE — Progress Notes (Signed)
Summary: Not better  Phone Note Call from Patient Call back at Home Phone [phone]   Summary of Call: Patient left message on triage that she is not better at all. Is there something we can do or should patient come back in for office visit? Please advise.  Initial call taken by: Lucious Groves CMA,  January 18, 2011 8:25 AM  Follow-up for Phone Call        Patient is coming in for office visit, closed phone note. Lucious Groves CMA  January 18, 2011 11:01 AM

## 2011-01-28 NOTE — Progress Notes (Signed)
----   Converted from flag ---- ---- 01/15/2011 4:11 PM, Army Fossa CMA wrote:  left message for pt to call back. ---- 01/13/2011 8:17 PM, Jose E. Paz MD wrote: please check on patient ,was seen w/ pain ------------------------------   I spoke w/ pt she states that she is still having pain, pt requested appt for today- she is coming in at 2:30.

## 2011-01-28 NOTE — Letter (Signed)
Summary: Out of Work  [facility] [employer]  [address] [place], [place] [address]   Phone: [phone]  Fax: [phone]    January 18, 2011   Employee:  TERRIYAH WESTRA    To Whom It May Concern:   For Medical reasons, please excuse the above named employee from work for the following dates:  Start:   January 18, 2011  End:   January 19, 2011  If you need additional information, please feel free to contact our office.         Sincerely,    Army Fossa CMA

## 2011-02-02 NOTE — Consult Note (Signed)
[facility]   Imported By: Maryln Gottron 01/26/2011 13:47:29  _____________________________________________________________________  External Attachment:    Type:   Image     Comment:   External Document

## 2011-02-02 NOTE — Progress Notes (Signed)
Summary: pt status  ---- Converted from flag ---- ---- 01/26/2011 4:02 PM, Felesia Stahlecker E. Chadley Dziedzic MD wrote:  please check on the patient,  is the  headache getting better ? ------------------------------  I spoke w/ pt she states that her HA is starting to get better. [facility]. Army Fossa CMA  January 26, 2011 4:17 PM

## 2011-02-10 ENCOUNTER — Encounter: Payer: Self-pay | Admitting: Internal Medicine

## 2011-02-18 NOTE — Letter (Signed)
Summary: Colonoscopy Letter   Gastroenterology  [address] [place], [place] [address]   Phone: [phone]  Fax: [phone]      February 10, 2011 MRN: [id number]   [facility] [address] [place], [place]  [address]   Dear Ms. Sisneros,   According to your medical record, it is time for you to schedule a Colonoscopy. The American Cancer Society recommends this procedure as a method to detect early colon cancer. Patients with a family history of colon cancer, or a personal history of colon polyps or inflammatory bowel disease are at increased risk.  This letter has been generated based on the recommendations made at the time of your procedure. If you feel that in your particular situation this may no longer apply, please contact our office.  Please call our office at [phone] to schedule this appointment or to update your records at your earliest convenience.  Thank you for cooperating with [place] to provide you with the very best care possible.   Sincerely,  Hedwig Morton. Juanda Chance, M.D.  [facility] Gastroenterology Division [phone]

## 2011-03-01 LAB — COMPREHENSIVE METABOLIC PANEL
ALT: 17 U/L (ref 0–35)
AST: 25 U/L (ref 0–37)
CO2: 30 mEq/L (ref 19–32)
Calcium: 10.4 mg/dL (ref 8.4–10.5)
Chloride: 104 mEq/L (ref 96–112)
Creatinine, Ser: 0.84 mg/dL (ref 0.4–1.2)
GFR calc Af Amer: >60 mL/min (ref >60)
GFR calc non Af Amer: >60 mL/min (ref >60)
Glucose, Bld: 131 mg/dL — ABNORMAL HIGH (ref 70–99)
Total Bilirubin: 0.6 mg/dL (ref 0.3–1.2)

## 2011-03-01 LAB — DIFFERENTIAL
Basophils Absolute: 0 10*3/uL (ref 0.0–0.1)
Eosinophils Absolute: 0.1 10*3/uL (ref 0.0–0.7)
Eosinophils Relative: 1 % (ref 0–5)
Lymphocytes Relative: 15 % (ref 12–46)
Lymphs Abs: 0.8 10*3/uL (ref 0.7–4.0)
Neutrophils Relative %: 80 % — ABNORMAL HIGH (ref 43–77)

## 2011-03-01 LAB — CBC
Hemoglobin: 14 g/dL (ref 12.0–15.0)
MCHC: 34.6 g/dL (ref 30.0–36.0)
MCV: 89.6 fL (ref 78.0–100.0)
RBC: 4.51 MIL/uL (ref 3.87–5.11)
WBC: 5.4 10*3/uL (ref 4.0–10.5)

## 2011-03-01 LAB — SEDIMENTATION RATE: Sed Rate: 4 mm/hr (ref 0–22)

## 2011-04-03 ENCOUNTER — Other Ambulatory Visit: Payer: Self-pay | Admitting: Internal Medicine

## 2011-04-09 NOTE — Discharge Summary (Signed)
[name], Carol Wilson                        ACCOUNT NO.:  [id number]   MEDICAL RECORD NO.:  [id number]                   PATIENT TYPE:  INP   LOCATION:  0484                                 FACILITY:  [facility]   PHYSICIAN:  Anselm Pancoast. Zachery Dakins, M.D.          DATE OF BIRTH:  February 10, 1946   DATE OF ADMISSION:  06/04/2002  DATE OF DISCHARGE:  06/11/2002                                 DISCHARGE SUMMARY   DISCHARGE DIAGNOSIS:  Acute appendicitis with rupture.   OPERATION:  Appendectomy.   HISTORY OF PRESENT ILLNESS:  The patient is a 65 year old Caucasian female  who awoke and presented to the emergency room on the early morning of  06/04/02, and was seen by Dr. Elmarie Shiley.  She said that she started having pain  early in the a.m., and she had nausea and upper abdominal pain generalized  the previous day, and then it shifted to the right lower quadrant and she  was very tender.  She was seen by Dr. Elmarie Shiley, and I was actually seeing  another patient, and he felt that she probably had appendicitis, as she was  very tender in the right lower quadrant.  Total white count was only 12,900.  I examined her and was in agreement there was definitely an acute surgical  problem brewing, and we added her to the OR schedule after starting her on  Unasyn and intravenous fluids.   HOSPITAL COURSE:  We were able to get her to the OR several hours later, and  an open appendectomy was performed, and she was found to have a gangrenous  appendix that had ruptured with localized peritonitis in the right lower  quadrant.  The incision was closed, and we cultured the fluid, and kept her  on Unasyn, and I was going to be gone for the next few days, and I had Dr.  Ezzard Standing follow her postoperatively.  The patient's soreness was definitely  less.  The following day her white blood cell count was 11,800, and then the  following day her white blood cell count was down to 7100.  She was on  Unasyn and Flagyl.  Her abdomen  was quiet.  Still tender in the right lower  quadrant.  Pain was decreasing, and on the second postoperative day, she was  mildly distended with kind of absent bowel sounds, and on the third day  passed a small amount of gas and was started on liquids.  She gradually  increased her diet.  She remained afebrile, less distended.  White blood  cell count was normal.  Then over the next few days she continued to  improve, but felt that she was not quite ready for discharge.  She was ready  finally to be discharged on 821/03, seventh postoperative day, eating,  abdomen was soft, wound okay, and follow up in the office in approximately  one week.  She was not discharged on any antibiotics as she  was afebrile for  three or four days and had a normal white blood cell count for three to four  days prior to discharge.                                               Anselm Pancoast. Zachery Dakins, M.D.    WJW/MEDQ  D:  06/27/2002  T:  07/02/2002  Job:  [id number]

## 2011-04-09 NOTE — Op Note (Signed)
[facility]  Patient:    Carol Wilson, Carol Wilson Visit Number: [id number] MRN: [id number]          Service Type: SUR Location: 4W 0484 02 Attending Physician:  Henrene Dodge Dictated by:   Anselm Pancoast. Zachery Dakins, M.D. Proc. Date: 06/04/02 Admit Date:  06/04/2002                             Operative Report  PREOPERATIVE DIAGNOSIS:  Acute appendicitis.  POSTOPERATIVE DIAGNOSIS:  Acute ruptured appendicitis.  OPERATION:  Open appendectomy.  ANESTHESIA:  General.  SURGEON:  Anselm Pancoast. Zachery Dakins, M.D.  ASSISTANT:  Nurse.  HISTORY:  The patient is a 65 year old Caucasian female who felt yesterday kind of nauseous, not really vomiting, progressive kind of generalized pain but then early this morning the pain definitely shifted to the lower abdomen as she was very tender. She arrived here in the emergency room approximately 6 a.m. seeing Dr. ______ and he had called me about another patient and asked me to see her. I saw her about 6:30 and on physical exam she was definitely tender in the right lower quadrant. White count was only about 12,700 and an acute abdomen series was essentially unremarkable. She had a little bit of blood in her urine but she says this is a chronic problem. On plain abdominal films we did not think she had a kidney stone and I recommended we proceed on with an appendectomy. I started her on 3 g of Unasyn. It was impressive that she was very tender in the right lower quadrant even though she did not have fever and her white count was only ______. The patient received 3 g of Unasyn and IV had been started.  PROCEDURE IN DETAIL:  The patient was taken to the operating suite probably about 9 a.m. After induction of general anesthesia, she had had a Foley catheter inserted in the ER and the abdomen was prepped with Betadine solution and draped in a sterile manner. A small right lower quadrant McBurney incision was made, sharp  incision, down through the skin, subcutaneous, Scarpas fascia, external oblique, internal oblique. The peritoneum was identified and this was picked up between two hemostats. Upon opening into the peritoneal cavity, there was definitely turbulent fluid, foul-smelling, and she had a gangrenous appendix, and she had ruptured it right at its base. The mesentery sort of kind of sealed off the appendix and I sort of finger dissected this up. The appendiceal mesentery was significantly inflamed and  divided in between [place]. One of the [facility] kind of slipped off and required a suture with 2-0 Vicryl. Good hemostasis was obtained and then I freed the appendiceal mesentery right on to its base of the appendix. The appendix was completely gangrenous and ruptured about a centimeter from its junction to the cecal base. Pursestring suture was placed. I removed the appendix, tied its base with a 2-0 Vicryl, stump inverted, and then pursestring suture tied. I was not comfortable with the closure with this and I actually reinforced the area with interrupted sutures of 3-0 silk. I then thoroughly irrigated the peritoneal cavity with about four liters of fluid until everything basically returned clear. All quadrants were irrigated. No drains were placed. The cecum is in its normal position. I then closed the peritoneum with a running 2-0 Vicryl, the internal oblique with interrupted 2-0 Vicryl, and the external oblique with interrupted 2-0 Vicryl after thoroughly irrigating within each layer.  Scarpas fascia was closed with two sutures of 4-0 Vicryl and two sutures of subcuticular 4-0 Vicryl were placed and then three Steri-Strips. The patient tolerated the procedure nicely and a sterile occlusive dressing applied. I am going to add Flagyl to the Unasyn and she will continue antibiotics for at least a couple of days. Hopefully, she will recover without developing an intra-abdominal abscess with this  generalized peritonitis. Cultures were obtained of the peritoneal fluid. Dictated by:   Anselm Pancoast. Zachery Dakins, M.D. Attending Physician:  Henrene Dodge DD:  06/04/02 TD:  06/05/02 Job: [id number]

## 2011-05-11 ENCOUNTER — Other Ambulatory Visit: Payer: Self-pay | Admitting: Internal Medicine

## 2011-05-11 NOTE — Telephone Encounter (Signed)
Pt is due for CPX.

## 2011-05-18 NOTE — Telephone Encounter (Signed)
Patient has appt for CPX on 7/17 at 1:30

## 2011-06-02 ENCOUNTER — Telehealth (HOSPITAL_BASED_OUTPATIENT_CLINIC_OR_DEPARTMENT_OTHER): Payer: Self-pay | Admitting: Emergency Medicine

## 2011-06-07 ENCOUNTER — Encounter: Payer: Self-pay | Admitting: Internal Medicine

## 2011-06-08 ENCOUNTER — Ambulatory Visit (AMBULATORY_SURGERY_CENTER): Payer: BC Managed Care – PPO | Admitting: *Deleted

## 2011-06-08 ENCOUNTER — Encounter: Payer: Self-pay | Admitting: Internal Medicine

## 2011-06-08 ENCOUNTER — Ambulatory Visit (INDEPENDENT_AMBULATORY_CARE_PROVIDER_SITE_OTHER): Payer: BC Managed Care – PPO | Admitting: Internal Medicine

## 2011-06-08 ENCOUNTER — Other Ambulatory Visit: Payer: Self-pay | Admitting: Internal Medicine

## 2011-06-08 VITALS — Ht 66.0 in | Wt 147.7 lb

## 2011-06-08 DIAGNOSIS — G250 Essential tremor: Secondary | ICD-10-CM

## 2011-06-08 DIAGNOSIS — F329 Major depressive disorder, single episode, unspecified: Secondary | ICD-10-CM

## 2011-06-08 DIAGNOSIS — M949 Disorder of cartilage, unspecified: Secondary | ICD-10-CM

## 2011-06-08 DIAGNOSIS — Z23 Encounter for immunization: Secondary | ICD-10-CM

## 2011-06-08 DIAGNOSIS — M899 Disorder of bone, unspecified: Secondary | ICD-10-CM

## 2011-06-08 DIAGNOSIS — E785 Hyperlipidemia, unspecified: Secondary | ICD-10-CM

## 2011-06-08 DIAGNOSIS — R51 Headache: Secondary | ICD-10-CM

## 2011-06-08 DIAGNOSIS — Z1211 Encounter for screening for malignant neoplasm of colon: Secondary | ICD-10-CM

## 2011-06-08 DIAGNOSIS — Z0000 Encounter for general adult medical examination without abnormal findings: Secondary | ICD-10-CM | POA: Insufficient documentation

## 2011-06-08 DIAGNOSIS — D649 Anemia, unspecified: Secondary | ICD-10-CM

## 2011-06-08 MED ORDER — PEG-KCL-NACL-NASULF-NA ASC-C 100 G PO SOLR: 1.0000 | Freq: Once | ORAL | Status: DC

## 2011-06-08 NOTE — Assessment & Plan Note (Signed)
On BBs

## 2011-06-08 NOTE — Progress Notes (Signed)
Subjective:    Patient ID: Carol Wilson, female    DOB: Apr 14, 1946, 65 y.o.   MRN: [id number]  HPI Here for Medicare AWV: 1. Risk factors based on Past M, S, F history: reviewed 2. Physical Activities: exercise x 2/week, yard work   3. Depression/mood: no problems noted or reported   4. Hearing: no problems noted or reported  5. ADL's:  independent 6. Fall Risk:no recent problems, low risk  7. home Safety: does feel safe at home  8. Height, weight, &visual acuity: see VS,  Uses glasses , vision corrected  9. Counseling: provided 10. Labs ordered based on risk factors: if needed  11. Referral Coordination: if needed 12.  Care Plan, see assessment and plan  13.   Cognitive Assessment: Motor kills are okay, familiar tremor not affecting her normal activities. Cognition normal  In addition, today we discussed the following: Osteopenia, very active, due for another bone density test Cephalalgia--status post evaluation by neurology doing much better. High cholesterol, good medication compliance, no apparent side effects.  Past Medical History  Diagnosis Date  . Depression   . Familial tremor   . Osteopenia   . Rosacea   . Hyperlipidemia   . Anxiety     no per pt   Past Surgical History  Procedure Date  . Appendectomy   . Tubal ligation   . Colonoscopy    History   Social History  . Marital Status: Married    Spouse Name: N/A    Number of Children: 1  . Years of Education: N/A   Occupational History  . cooks for the school [employer]   Social History Main Topics  . Smoking status: Never Smoker   . Smokeless tobacco: Not on file  . Alcohol Use: No  . Drug Use: No  . Sexually Active: Not on file   Other Topics Concern  . Not on file   Social History Narrative   Very active--eats healthy most of the time--- lost her only daughter to lunf cancer ~ 2006   Family History  Problem Relation Age of Onset  . Lung cancer Daughter   . Diabetes Neg Hx   .  Coronary artery disease Mother     M 58y/o and F 68y/o  . Hypertension Neg Hx   . Stroke Neg Hx   . Colon cancer Neg Hx   . Breast cancer Neg Hx   . Heart attack Mother 14  . Heart attack Father 73      Review of Systems  Constitutional: Negative for fever, fatigue and unexpected weight change.  Respiratory: Negative for cough and shortness of breath.   Cardiovascular: Negative for chest pain. Leg swelling: mild edema at the end of the day.  Gastrointestinal: Negative for abdominal pain and blood in stool.  Genitourinary: Negative for dysuria, hematuria and difficulty urinating.       Objective:   Physical Exam  Constitutional: She is oriented to person, place, and time. She appears well-nourished. No distress.  HENT:  Head: Normocephalic and atraumatic.  Neck: No thyromegaly present.       Normal carotid pulses bilaterally  Cardiovascular: Normal rate, regular rhythm and normal heart sounds.   No murmur heard. Pulmonary/Chest: Effort normal and breath sounds normal. No respiratory distress. She has no wheezes. She has no rales.  Abdominal: Soft. Bowel sounds are normal. She exhibits no distension. There is no tenderness. There is no rebound and no guarding.  Genitourinary:  Breast exam normal bilaterally without dominant mass, no axillary adenopathy    Musculoskeletal: She exhibits no edema.  Neurological: She is alert and oriented to person, place, and time.  Skin: Skin is warm and dry. She is not diaphoretic.  Psychiatric: She has a normal mood and affect. Her behavior is normal. Judgment and thought content normal.          Assessment & Plan:

## 2011-06-08 NOTE — Assessment & Plan Note (Addendum)
Td 08 Pneumonia shot today shingles shot -- completed  Pap Smear: 2009 and 2011, normal Never had an abnormal PAP, married twice, rec next PAP ~ 2014  breast exam normal todayl  Mammogram:  11/27/2009:normal, repeated MMG 04-2011 normal per pt   she had a normal colonoscopy 2002  No family history colon cancer, per GI due for another Cscope : schedule to be done in ~ 2 weeks   patient education: diet-exercise, dicussed

## 2011-06-08 NOTE — Assessment & Plan Note (Signed)
Last DEXA 9-09, vit D 2-11 normal Plan: Schedule a DEXA, Ca, vit D, exercise

## 2011-06-08 NOTE — Assessment & Plan Note (Addendum)
CEPHALGIA early in 2012, saw neuro, they rec a MRA apparently denied by the insurance Co. But had a MRI;doing better w/ local injections, still on neurontin

## 2011-06-08 NOTE — Assessment & Plan Note (Signed)
On meds , due for labs

## 2011-06-08 NOTE — Patient Instructions (Signed)
Came back fasting within few days: CMP, FLP--- dx hyperlipidemia CBC---dx screening anemia  Calcium and vit D daily!

## 2011-06-08 NOTE — Assessment & Plan Note (Signed)
Lost daughter to cancer ~ 2006, sx well controlled

## 2011-06-09 ENCOUNTER — Other Ambulatory Visit (INDEPENDENT_AMBULATORY_CARE_PROVIDER_SITE_OTHER): Payer: BC Managed Care – PPO

## 2011-06-09 DIAGNOSIS — E785 Hyperlipidemia, unspecified: Secondary | ICD-10-CM

## 2011-06-09 DIAGNOSIS — D649 Anemia, unspecified: Secondary | ICD-10-CM

## 2011-06-09 LAB — HEPATIC FUNCTION PANEL
ALT: 13 U/L (ref 0–35)
AST: 24 U/L (ref 0–37)
Albumin: 4.3 g/dL (ref 3.5–5.2)
Total Bilirubin: 0.5 mg/dL (ref 0.3–1.2)
Total Protein: 6.9 g/dL (ref 6.0–8.3)

## 2011-06-09 LAB — BASIC METABOLIC PANEL
BUN: 14 mg/dL (ref 6–23)
CO2: 28 mEq/L (ref 19–32)
Chloride: 110 mEq/L (ref 96–112)
Glucose, Bld: 92 mg/dL (ref 70–99)
Potassium: 3.8 mEq/L (ref 3.5–5.1)

## 2011-06-09 LAB — CBC WITH DIFFERENTIAL/PLATELET
Basophils Absolute: 0 10*3/uL (ref 0.0–0.1)
Eosinophils Absolute: 0.1 10*3/uL (ref 0.0–0.7)
HCT: 34.1 % — ABNORMAL LOW (ref 36.0–46.0)
Lymphs Abs: 1 10*3/uL (ref 0.7–4.0)
MCV: 85.3 fl (ref 78.0–100.0)
Monocytes Absolute: 0.4 10*3/uL (ref 0.1–1.0)
Neutrophils Relative %: 70 % (ref 43.0–77.0)
Platelets: 243 10*3/uL (ref 150.0–400.0)
RDW: 13.8 % (ref 11.5–14.6)

## 2011-06-09 LAB — LIPID PANEL
Cholesterol: 185 mg/dL (ref 0–200)
Triglycerides: 54 mg/dL (ref 0.0–149.0)

## 2011-06-09 NOTE — Progress Notes (Signed)
Labs only

## 2011-06-10 ENCOUNTER — Encounter: Payer: Self-pay | Admitting: Internal Medicine

## 2011-06-11 ENCOUNTER — Telehealth: Payer: Self-pay | Admitting: *Deleted

## 2011-06-11 NOTE — Telephone Encounter (Signed)
Message left for patient to return my call. (needs to come back for additional labs)

## 2011-06-11 NOTE — Telephone Encounter (Signed)
Message copied by Leanne Lovely on Fri Jun 11, 2011  2:55 PM ------      Message from: Willow Ora E      Created: Fri Jun 11, 2011  1:58 PM       Advise patient:      Cholesterol continue to be well-controlled, it was better before. Continue with same medications and watch diet.      She has mild anemia, add ferritin-iron: DX anemia.      Send labs to her GI. She is to have a colonoscopy in few days.      Other labs wnl

## 2011-06-14 ENCOUNTER — Encounter: Payer: Self-pay | Admitting: Internal Medicine

## 2011-06-14 ENCOUNTER — Other Ambulatory Visit: Payer: Self-pay | Admitting: Internal Medicine

## 2011-06-14 DIAGNOSIS — D649 Anemia, unspecified: Secondary | ICD-10-CM

## 2011-06-14 MED ORDER — SIMVASTATIN 40 MG PO TABS: 20.0000 mg | ORAL_TABLET | Freq: Every day | ORAL | Status: DC

## 2011-06-14 NOTE — Telephone Encounter (Signed)
Pt is aware.  

## 2011-06-15 ENCOUNTER — Other Ambulatory Visit (INDEPENDENT_AMBULATORY_CARE_PROVIDER_SITE_OTHER): Payer: BC Managed Care – PPO

## 2011-06-15 DIAGNOSIS — D649 Anemia, unspecified: Secondary | ICD-10-CM

## 2011-06-15 LAB — FERRITIN: Ferritin: 6.8 ng/mL — ABNORMAL LOW (ref 10.0–291.0)

## 2011-06-15 NOTE — Progress Notes (Signed)
Labs only

## 2011-06-22 ENCOUNTER — Encounter: Payer: Self-pay | Admitting: Internal Medicine

## 2011-06-22 ENCOUNTER — Ambulatory Visit (AMBULATORY_SURGERY_CENTER): Payer: BC Managed Care – PPO | Admitting: Internal Medicine

## 2011-06-22 VITALS — BP 111/66 | HR 51 | Temp 98.8°F | Resp 20 | Ht 66.0 in | Wt 147.0 lb

## 2011-06-22 DIAGNOSIS — Z1211 Encounter for screening for malignant neoplasm of colon: Secondary | ICD-10-CM

## 2011-06-22 DIAGNOSIS — D126 Benign neoplasm of colon, unspecified: Secondary | ICD-10-CM

## 2011-06-22 MED ORDER — SODIUM CHLORIDE 0.9 % IV SOLN: 500.0000 mL | INTRAVENOUS | Status: DC

## 2011-06-22 NOTE — Patient Instructions (Signed)
DISCHARGE INSTRUCTIONS GIVEN WITH VERBAL UNDERSTANDING. HANDOUT ON POLYPS GIVEN. RESUME PREVIOUS MEDICATIONS. 

## 2011-06-22 NOTE — Progress Notes (Signed)
Changed from 571 to 08, scope change.

## 2011-06-23 ENCOUNTER — Telehealth: Payer: Self-pay

## 2011-06-23 NOTE — Telephone Encounter (Signed)

## 2011-06-25 ENCOUNTER — Inpatient Hospital Stay: Admission: RE | Admit: 2011-06-25 | Payer: BC Managed Care – PPO | Source: Ambulatory Visit

## 2011-06-25 ENCOUNTER — Ambulatory Visit (INDEPENDENT_AMBULATORY_CARE_PROVIDER_SITE_OTHER)
Admission: RE | Admit: 2011-06-25 | Discharge: 2011-06-25 | Disposition: A | Discharge status: Home / Self Care | Payer: BC Managed Care – PPO | Source: Ambulatory Visit

## 2011-06-25 DIAGNOSIS — M949 Disorder of cartilage, unspecified: Secondary | ICD-10-CM

## 2011-06-25 DIAGNOSIS — M899 Disorder of bone, unspecified: Secondary | ICD-10-CM

## 2011-06-28 ENCOUNTER — Encounter: Payer: Self-pay | Admitting: Internal Medicine

## 2011-08-18 ENCOUNTER — Other Ambulatory Visit: Payer: Self-pay | Admitting: Internal Medicine

## 2011-08-18 NOTE — Telephone Encounter (Signed)
Zoloft request [last refill 05/11/11 #30x0 last OV 06/08/11]

## 2011-08-24 ENCOUNTER — Other Ambulatory Visit: Payer: Self-pay | Admitting: Internal Medicine

## 2011-08-24 NOTE — Telephone Encounter (Signed)
Zoloft request [last refill 05/11/11 #30x0]

## 2011-08-24 NOTE — Telephone Encounter (Signed)
Ok x 1 year 

## 2011-08-24 NOTE — Telephone Encounter (Signed)
Ok RF x 1 year 

## 2011-08-25 MED ORDER — SERTRALINE HCL 100 MG PO TABS: 100.0000 mg | ORAL_TABLET | Freq: Every day | ORAL | Status: DC

## 2011-08-25 NOTE — Telephone Encounter (Signed)
Rx Done . 

## 2011-11-01 ENCOUNTER — Other Ambulatory Visit: Payer: Self-pay | Admitting: Surgery

## 2012-01-12 ENCOUNTER — Ambulatory Visit (INDEPENDENT_AMBULATORY_CARE_PROVIDER_SITE_OTHER): Payer: BC Managed Care – PPO | Admitting: Internal Medicine

## 2012-01-12 ENCOUNTER — Telehealth: Payer: Self-pay | Admitting: *Deleted

## 2012-01-12 ENCOUNTER — Encounter: Payer: Self-pay | Admitting: Internal Medicine

## 2012-01-12 DIAGNOSIS — J209 Acute bronchitis, unspecified: Secondary | ICD-10-CM

## 2012-01-12 DIAGNOSIS — J069 Acute upper respiratory infection, unspecified: Secondary | ICD-10-CM | POA: Diagnosis not present

## 2012-01-12 MED ORDER — AMOXICILLIN-POT CLAVULANATE 875-125 MG PO TABS: 1.0000 | ORAL_TABLET | Freq: Two times a day (BID) | ORAL | Status: AC

## 2012-01-12 MED ORDER — HYDROCODONE-HOMATROPINE 5-1.5 MG/5ML PO SYRP: 5.0000 mL | ORAL_SOLUTION | Freq: Four times a day (QID) | ORAL | Status: AC | PRN

## 2012-01-12 MED ORDER — FLUTICASONE PROPIONATE 50 MCG/ACT NA SUSP: 1.0000 | Freq: Two times a day (BID) | NASAL | Status: DC | PRN

## 2012-01-12 NOTE — Telephone Encounter (Signed)
Triage Call Report Triage Record Num: [id number] Operator: Chevis Pretty Patient Name: Carol Wilson Call Date & Time: 01/12/2012 2:47:30PM Patient Phone: [phone] PCP: Nolon Rod. Paz Patient Gender: Female PCP Fax : Patient DOB: 06-10-1946 Practice Name: [facility] Reason for Call: Caller: Carol Wilson/Patient; PCP: Willow Ora; CB#: [phone]; ; ; Call regarding ?Marland Kitchen Sinus Infection; Has Had Cold Symptoms X. 2. Weeks; is coughing up greenish phlegm. States symptoms not resolved with OTC medications or home care. Per protocol, emergent symptoms denied; advised appt within 24 hours. Appt sched 1545 01/12/12 with Dr. Alwyn Ren. Protocol(s) Used: Upper Respiratory Infection (URI) Recommended Outcome per Protocol: See Provider within 24 hours Reason for Outcome: Productive cough with colored sputum (other than clear or white sputum) Symptoms worsen after 7 days or symptoms do not improve after 14 days of home care Care Advice: ~ Use a cool mist humidifier to moisten air. Be sure to clean according to manufacturer's instructions. ~ Rest until symptoms improve. ~ May inhale steam from hot shower or heated water. Be careful to avoid burns. Limit or avoid exposure to irritants and allergens (e.g. air pollution, smoke/smoking, chemicals, dust, pollen, pet dander, etc.) ~ Increase fluids to 8-12 eight oz (1.6 to 2.4 liters) glasses per day, half of them to be water. Soups, popsicles, fruit juices, non-caffeinated sodas (unless restricting sodium intake), jello, broths, decaf teas, etc. are all okay. Warm fluids can be soothing. ~ ~ Consider use of a saline nasal spray per package directions to help relieve nasal congestion. ~ If you can, stop smoking now and avoid all secondhand smoke. ~ Warm fluids may help, or try a mixture of honey and lemon juice in warm tea. ~ HEALTH PROMOTION / MAINTENANCE ~ SYMPTOM / CONDITION MANAGEMENT ~ INFECTION CONTROL ~ CAUTIONS Coughing up mucus or  phlegm helps to get rid of an infection. A productive cough should not be stopped. A cough medicine with guaifenesin (Robitussin, Mucinex) can help loosen the mucus. Cough medicine with dextromethorphan (DM) should be avoided. Drinking lots of fluids can help loosen the mucus too, especially warm fluids. ~ Go to the ED if new onset of stiff neck (unable to touch chin to chest), generalized headache, change in mental status, difficulty opening mouth, unable to swallow liquids or signs of dehydration. ~ Most adults need to drink 6-10 eight-ounce glasses (1.2-2.0 liters) of fluids per day unless previously told to limit fluid intake for other medical reasons. Limit fluids that contain caffeine, sugar or alcohol. Urine will be a very light yellow color when you drink enough fluids. ~ Analgesic/Antipyretic Advice - Acetaminophen: Consider acetaminophen as directed on label or by pharmacist/provider for pain or fever PRECAUTIONS: - Use if there is no history of liver disease, alcoholism, or intake of three or more alcohol drinks per day - Only if approved by provider during pregnancy or when breastfeeding - During pregnancy, acetaminophen should not be taken more than 3 consecutive days without telling provider - Do not exceed recommended dose or frequency ~ 01/12/2012 2:53:44PM Page 1 of 2 [id number] [facility] Triage Call Report Patient Name: Carol Wilson continuation page/s - Do not exceed recommended dose or frequency ~ Go to the ED if having chest pain with breathing or breathing is becoming more difficult. Call provider if has a fever over 101.5 F (38.6 C) that has not responded to home care measures, having shaking chills or any fever in someone immunocompromised/frail elderly. ~ Speak with your provider as soon as possible if: - any temperature elevation  in a frail elderly or immunocompromised patient (such as diabetes, HIV/AIDS, renal disease, chemotherapy, organ  transplant, or chronic steroid use). - pregnant and temperature elevation of 100.5 F (38C) or above. - fever does not respond despite 2 doses of fever reducing medication. - fever responds to home care but persists for 3 days or more. ~ Analgesic/Antipyretic Advice - NSAIDs: Consider aspirin, ibuprofen, naproxen or ketoprofen for pain or fever as directed on label or by pharmacist/provider. PRECAUTIONS: - If over 27 years of age, should not take longer than 1 week without consulting provider. EXCEPTIONS: - Should not be used if taking blood thinners or have bleeding problems. - Do not use if have history of sensitivity/allergy to any of these medications; or history of cardiovascular, ulcer, kidney, liver disease or diabetes unless approved by provider. - Do not exceed recommended dose or frequency. ~ Respiratory Hygiene: - Cover the nose/mouth tightly with a tissue when coughing or sneezing. - Use tissue 1 time and discard in the nearest waste receptacle. - Wash hands with soap and water or alcohol-based hand rub after coming into contact with respiratory secretions and contaminated objects/materials. - Alternatively when no tissue is available, cough into the bend of the elbow. - .Avoid touching your eyes, nose or mouth. ~ 02

## 2012-01-12 NOTE — Progress Notes (Signed)
  Subjective:    Patient ID: Carol Wilson, female    DOB: November 02, 1946, 66 y.o.   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 Progression of symptoms:to head congestion Treatments/response:Zyrtec, Mucinex D,Nyquil, Afrin variant w/o help Present symptoms: Fever/chills/sweats:no Frontal headache:no Facial pain:no Nasal purulence:yes Sore throat:no Dental pain:no Lymphadenopathy:no Wheezing/shortness of breath:no Cough/sputum/hemoptysis:sputum green Pleuritic pain:no Associated extrinsic/allergic symptoms:itchy eyes/ sneezing:no Past medical history: Seasonal allergies: no/asthma:no Smoking history:never           Review of Systems     Objective:   Physical Exam General appearance:good health ;well nourished; no acute distress or increased work of breathing is present.  No  lymphadenopathy about the head, neck, or axilla noted.   Eyes: No conjunctival inflammation or lid edema is present. There is no scleral icterus.  Ears:  External ear exam shows no significant lesions or deformities.  Otoscopic examination reveals clear canals, tympanic membranes are intact bilaterally without bulging, retraction, inflammation or discharge.  Nose:  External nasal examination shows no deformity or inflammation. Nasal mucosa are pink and moist without lesions or exudates. No septal dislocation or deviation.No obstruction to airflow. Hyponasal speech  Oral exam: Dental hygiene is good; lips and gums are healthy appearing.There is no oropharyngeal erythema or exudate noted.     Heart:  Normal rate and regular rhythm. S1 and S2 normal without gallop, murmur, click, rub or other extra sounds.   Lungs:Chest clear to auscultation; no wheezes, rhonchi,rales ,or rubs present.No increased work of breathing.  Dry cough   Extremities:  No cyanosis, edema, or clubbing  noted     Skin: Warm & dry           Assessment & Plan:  #1 upper respiratory infection with sinusitis # bronchitis , acute w/o bronchospasm Plan: See orders and recommendations

## 2012-01-12 NOTE — Patient Instructions (Addendum)
Plain Mucinex for thick secretions ;force NON dairy fluids . Use a Neti pot daily as needed for sinus congestion .Nasal cleansing in the shower as discussed. Make sure that all residual soap is removed to prevent irritation. Flonase 1 spray in each nostril twice a day as needed. Use the "crossover" technique as discussed   

## 2012-01-14 NOTE — Telephone Encounter (Signed)
Was seen by Dr. Alwyn Ren on 2.20.13.

## 2012-01-14 NOTE — Telephone Encounter (Signed)
She is on Augmentin, narcotic cough syrup and Flonase. This should be optimal regimen but  has not had a chance to work. Please check with her as to status.

## 2012-01-14 NOTE — Telephone Encounter (Signed)
Spoke with pt & she is states she is feeling better.

## 2012-01-14 NOTE — Telephone Encounter (Signed)
Hopp, you saw her 2 days ago, please advise

## 2012-06-14 ENCOUNTER — Other Ambulatory Visit: Payer: Self-pay | Admitting: Internal Medicine

## 2012-06-14 NOTE — Telephone Encounter (Signed)
Refill done.  

## 2012-06-26 ENCOUNTER — Other Ambulatory Visit: Payer: Self-pay | Admitting: Internal Medicine

## 2012-06-26 NOTE — Telephone Encounter (Signed)
Refill done.  

## 2012-07-21 DIAGNOSIS — Z1231 Encounter for screening mammogram for malignant neoplasm of breast: Secondary | ICD-10-CM | POA: Diagnosis not present

## 2012-12-29 ENCOUNTER — Ambulatory Visit (INDEPENDENT_AMBULATORY_CARE_PROVIDER_SITE_OTHER): Payer: BC Managed Care – PPO | Admitting: Internal Medicine

## 2012-12-29 ENCOUNTER — Encounter: Payer: Self-pay | Admitting: Internal Medicine

## 2012-12-29 ENCOUNTER — Other Ambulatory Visit (HOSPITAL_COMMUNITY)
Admission: RE | Admit: 2012-12-29 | Discharge: 2012-12-29 | Disposition: A | Discharge status: Home / Self Care | Payer: BC Managed Care – PPO | Source: Ambulatory Visit | Attending: Internal Medicine | Admitting: Internal Medicine

## 2012-12-29 VITALS — BP 112/78 | HR 72 | Temp 98.1°F | Ht 65.0 in | Wt 153.0 lb

## 2012-12-29 DIAGNOSIS — M899 Disorder of bone, unspecified: Secondary | ICD-10-CM | POA: Diagnosis not present

## 2012-12-29 DIAGNOSIS — G250 Essential tremor: Secondary | ICD-10-CM

## 2012-12-29 DIAGNOSIS — F3289 Other specified depressive episodes: Secondary | ICD-10-CM

## 2012-12-29 DIAGNOSIS — G252 Other specified forms of tremor: Secondary | ICD-10-CM | POA: Diagnosis not present

## 2012-12-29 DIAGNOSIS — G4700 Insomnia, unspecified: Secondary | ICD-10-CM

## 2012-12-29 DIAGNOSIS — L719 Rosacea, unspecified: Secondary | ICD-10-CM

## 2012-12-29 DIAGNOSIS — Z01419 Encounter for gynecological examination (general) (routine) without abnormal findings: Secondary | ICD-10-CM | POA: Insufficient documentation

## 2012-12-29 DIAGNOSIS — J069 Acute upper respiratory infection, unspecified: Secondary | ICD-10-CM

## 2012-12-29 DIAGNOSIS — F329 Major depressive disorder, single episode, unspecified: Secondary | ICD-10-CM

## 2012-12-29 DIAGNOSIS — E785 Hyperlipidemia, unspecified: Secondary | ICD-10-CM

## 2012-12-29 DIAGNOSIS — Z0000 Encounter for general adult medical examination without abnormal findings: Secondary | ICD-10-CM

## 2012-12-29 DIAGNOSIS — M949 Disorder of cartilage, unspecified: Secondary | ICD-10-CM | POA: Diagnosis not present

## 2012-12-29 LAB — CBC WITH DIFFERENTIAL/PLATELET
Basophils Absolute: 0 10*3/uL (ref 0.0–0.1)
Eosinophils Absolute: 0.1 10*3/uL (ref 0.0–0.7)
HCT: 41.2 % (ref 36.0–46.0)
Hemoglobin: 13.9 g/dL (ref 12.0–15.0)
Lymphs Abs: 1.1 10*3/uL (ref 0.7–4.0)
MCHC: 33.6 g/dL (ref 30.0–36.0)
Monocytes Relative: 10.8 % (ref 3.0–12.0)
Neutro Abs: 2.2 10*3/uL (ref 1.4–7.7)
Platelets: 261 10*3/uL (ref 150.0–400.0)
RDW: 13.4 % (ref 11.5–14.6)

## 2012-12-29 LAB — LIPID PANEL
Cholesterol: 240 mg/dL — ABNORMAL HIGH (ref 0–200)
Total CHOL/HDL Ratio: 4
Triglycerides: 61 mg/dL (ref 0.0–149.0)
VLDL: 12.2 mg/dL (ref 0.0–40.0)

## 2012-12-29 LAB — COMPREHENSIVE METABOLIC PANEL
Alkaline Phosphatase: 72 U/L (ref 39–117)
BUN: 19 mg/dL (ref 6–23)
CO2: 28 mEq/L (ref 19–32)
GFR: 70.01 mL/min (ref >60.00)
Glucose, Bld: 91 mg/dL (ref 70–99)
Sodium: 140 mEq/L (ref 135–145)
Total Bilirubin: 0.8 mg/dL (ref 0.3–1.2)
Total Protein: 7.4 g/dL (ref 6.0–8.3)

## 2012-12-29 LAB — TSH: TSH: 1.58 u[IU]/mL (ref 0.35–5.50)

## 2012-12-29 MED ORDER — PROPRANOLOL HCL 10 MG PO TABS: 20.0000 mg | ORAL_TABLET | Freq: Every day | ORAL | Status: DC

## 2012-12-29 MED ORDER — ALPRAZOLAM 0.5 MG PO TABS: ORAL_TABLET | ORAL | Status: DC

## 2012-12-29 MED ORDER — SERTRALINE HCL 100 MG PO TABS: 100.0000 mg | ORAL_TABLET | Freq: Every day | ORAL | Status: DC

## 2012-12-29 MED ORDER — METRONIDAZOLE 1 % EX GEL: 1.0000 "application " | Freq: Every day | CUTANEOUS | Status: AC

## 2012-12-29 MED ORDER — FLUTICASONE PROPIONATE 50 MCG/ACT NA SUSP: 1.0000 | Freq: Two times a day (BID) | NASAL | Status: DC | PRN

## 2012-12-29 NOTE — Progress Notes (Signed)
Subjective:    Patient ID: Carol Wilson, female    DOB: Mar 20, 1946, 67 y.o.   MRN: [id number]  HPI  Here for Medicare AWV:  1. Risk factors based on Past M, S, F history: reviewed  2. Physical Activities: exercise x 2/week, yard work  3. Depression/mood: (-) screening   4. Hearing: no problems noted or reported, no tinnitus  5. ADL's: independent  6. Fall Risk:no recent problems, low risk  7. home Safety: does feel safe at home  8. Height, weight, &visual acuity: see VS, Uses glasses , vision corrected, sees eye doctor regulalrly 9. Counseling: provided  10. Labs ordered based on risk factors: if needed  11. Referral Coordination: if needed  12. Care Plan, see assessment and plan  13. Cognitive Assessment: Motor kills are okay, familiar tremor not affecting her normal activities. Cognition normal   In addition, today we discussed the following: Depression, good medication compliance, symptoms well-controlled. Insomnia, on Xanax as needed, works well for her. High cholesterol, self discontinued Zocor 3 months ago, she is experimenting with it rice yeast. Admits her diet is regular, there is room for improvement. Tremor, ran out of Inderal few weeks ago and definitely see a difference.  Past Medical History  Diagnosis Date  . Depression     started ~ 2008 after she lost a daughter  . Familial tremor   . Osteopenia   . Rosacea   . Hyperlipidemia    Past Surgical History  Procedure Date  . Appendectomy   . Tubal ligation   . Colonoscopy    History   Social History  . Marital Status: Married    Spouse Name: N/A    Number of Children: 2  . Years of Education: N/A   Occupational History  . cooks for the school [employer]   Social History Main Topics  . Smoking status: Never Smoker   . Smokeless tobacco: Never Used  . Alcohol Use: Yes     Comment: socially   . Drug Use: No  . Sexually Active: Not on file   Other Topics Concern  . Not on file   Social  History Narrative   Very active--lost her only daughter to lung cancer ~ 2008, lost a 17 old month baby years ago , no living children, takes care of  his Barnetta Hammersmith, 49 y/0--   Family History  Problem Relation Age of Onset  . Lung cancer Daughter   . Diabetes Neg Hx   . Coronary artery disease Mother     M 58y/o and F 68y/o  . Hypertension Neg Hx   . Stroke Neg Hx   . Colon cancer Neg Hx   . Breast cancer Neg Hx     Review of Systems No chest pain or shortness or breath No nausea, vomiting, diarrhea. No dysuria gross hematuria. No vaginal discharge or vaginal bleeding.    Objective:   Physical Exam General -- alert, well-developed, and well-nourished.   Neck --no thyromegaly Breasts-- No mass, nodules, thickening, tenderness, bulging, retraction, inflamation, nipple discharge or skin changes noted.  no axillary lymph nodes Lungs -- normal respiratory effort, no intercostal retractions, no accessory muscle use, and normal breath sounds.   Heart-- normal rate, regular rhythm, no murmur, and no gallop.   Abdomen--soft, non-tender, no distention, no masses, no HSM, no guarding, and no rigidity.   Extremities-- no pretibial edema bilaterally GU-- Normal external examination. Vagina without lesions, no discharge, very mild dryness. Cervix normal to inspection, Pap smear  sent. Bimanual exam without tenderness or mass. Neurologic-- alert & oriented X3 and strength normal in all extremities. Psych-- Cognition and judgment appear intact. Alert and cooperative with normal attention span and concentration.  not anxious appearing and not depressed appearing.      Assessment & Plan:

## 2012-12-29 NOTE — Assessment & Plan Note (Signed)
Refill Xanax as needed

## 2012-12-29 NOTE — Patient Instructions (Addendum)
Stay active, try to eat healthy, specifically try to moderate your carbohydrate intake. Next visit in one year, sooner if you have any problems.   Fall Prevention and Home Safety Falls cause injuries and can affect all age groups. It is possible to use preventive measures to significantly decrease the likelihood of falls. There are many simple measures which can make your home safer and prevent falls. OUTDOORS  Repair cracks and edges of walkways and driveways.  Remove high doorway thresholds.  Trim shrubbery on the main path into your home.  Have good outside lighting.  Clear walkways of tools, rocks, debris, and clutter.  Check that handrails are not broken and are securely fastened. Both sides of steps should have handrails.  Have leaves, snow, and ice cleared regularly.  Use sand or salt on walkways during winter months.  In the garage, clean up grease or oil spills. BATHROOM  Install night lights.  Install grab bars by the toilet and in the tub and shower.  Use non-skid mats or decals in the tub or shower.  Place a plastic non-slip stool in the shower to sit on, if needed.  Keep floors dry and clean up all water on the floor immediately.  Remove soap buildup in the tub or shower on a regular basis.  Secure bath mats with non-slip, double-sided rug tape.  Remove throw rugs and tripping hazards from the floors. BEDROOMS  Install night lights.  Make sure a bedside light is easy to reach.  Do not use oversized bedding.  Keep a telephone by your bedside.  Have a firm chair with side arms to use for getting dressed.  Remove throw rugs and tripping hazards from the floor. KITCHEN  Keep handles on pots and pans turned toward the center of the stove. Use back burners when possible.  Clean up spills quickly and allow time for drying.  Avoid walking on wet floors.  Avoid hot utensils and knives.  Position shelves so they are not too high or low.  Place  commonly used objects within easy reach.  If necessary, use a sturdy step stool with a grab bar when reaching.  Keep electrical cables out of the way.  Do not use floor polish or wax that makes floors slippery. If you must use wax, use non-skid floor wax.  Remove throw rugs and tripping hazards from the floor. STAIRWAYS  Never leave objects on stairs.  Place handrails on both sides of stairways and use them. Fix any loose handrails. Make sure handrails on both sides of the stairways are as long as the stairs.  Check carpeting to make sure it is firmly attached along stairs. Make repairs to worn or loose carpet promptly.  Avoid placing throw rugs at the top or bottom of stairways, or properly secure the rug with carpet tape to prevent slippage. Get rid of throw rugs, if possible.  Have an electrician put in a light switch at the top and bottom of the stairs. OTHER FALL PREVENTION TIPS  Wear low-heel or rubber-soled shoes that are supportive and fit well. Wear closed toe shoes.  When using a stepladder, make sure it is fully opened and both spreaders are firmly locked. Do not climb a closed stepladder.  Add color or contrast paint or tape to grab bars and handrails in your home. Place contrasting color strips on first and last steps.  Learn and use mobility aids as needed. Install an electrical emergency response system.  Turn on lights to avoid dark  areas. Replace light bulbs that burn out immediately. Get light switches that glow.  Arrange furniture to create clear pathways. Keep furniture in the same place.  Firmly attach carpet with non-skid or double-sided tape.  Eliminate uneven floor surfaces.  Select a carpet pattern that does not visually hide the edge of steps.  Be aware of all pets. OTHER HOME SAFETY TIPS  Set the water temperature for 120 F (48.8 C).  Keep emergency numbers on or near the telephone.  Keep smoke detectors on every level of the home and near  sleeping areas. Document Released: 10/29/2002 Document Revised: 05/09/2012 Document Reviewed: 01/28/2012 [facility] Patient Information 2013 [place], [place].

## 2012-12-29 NOTE — Assessment & Plan Note (Addendum)
Osteopenia per bone density test a-2012, was recommended calcium and vitamin D as well as exercise.  currently active and taking OTC vitamin D. Next bone density test 06-2013

## 2012-12-29 NOTE — Assessment & Plan Note (Signed)
Refill medications

## 2012-12-29 NOTE — Assessment & Plan Note (Addendum)
Td 08 Pneumonia shot - 2012  shingles shot -- completed  Pap Smear: 2009 and 2011, normal. Never had an abnormal PAP, married twice PAP  Sent today  breast exam normal todayl  Had a MMG 2013,  normal per pt , no report in the chart  No family history of colon cancer colonoscopy 2002 (-) Colonoscopy again 05/2011, next per GI  patient education: diet-exercise, dicussed

## 2012-12-29 NOTE — Assessment & Plan Note (Signed)
Self discontinued Zocor 3 months ago, on red rice yeast. She is active but recognizes his room for improvement of her diet. She has a family history of heart disease, I will set a goal of the LDL close to 100. Plan: Labs, is likely she will need medication. Patient aware

## 2012-12-29 NOTE — Assessment & Plan Note (Addendum)
Lost 2 children in the past, see social history. Symptoms well controlled with current meds

## 2012-12-29 NOTE — Assessment & Plan Note (Signed)
She ran out of Inderal and symptoms increase. Plan: Refill meds, at some time may consider a neurological referral but currently symptoms well controlled with beta blockers

## 2013-01-02 ENCOUNTER — Telehealth: Payer: Self-pay | Admitting: *Deleted

## 2013-01-02 MED ORDER — SIMVASTATIN 40 MG PO TABS: 20.0000 mg | ORAL_TABLET | Freq: Every day | ORAL | Status: DC

## 2013-01-02 NOTE — Telephone Encounter (Addendum)
Message copied by Nada Maclachlan on Tue Jan 02, 2013  4:52 PM ------      Message from: Carol Wilson      Created: Mon Jan 01, 2013  9:31 AM       I release her labs few days ago, they change the mychart system, I just like to be sure she got them. Call her and ask if she was able to see her labs and my comments.      Let me know  ------

## 2013-01-02 NOTE — Telephone Encounter (Signed)
thx

## 2013-01-02 NOTE — Telephone Encounter (Signed)
Spoke with pt, she has not checked her labs yet. I discussed labs with pt & sent rx to pharmacy.

## 2013-01-06 ENCOUNTER — Other Ambulatory Visit: Payer: Self-pay

## 2013-04-15 ENCOUNTER — Telehealth: Payer: Self-pay | Admitting: Internal Medicine

## 2013-04-15 DIAGNOSIS — E785 Hyperlipidemia, unspecified: Secondary | ICD-10-CM

## 2013-04-15 NOTE — Telephone Encounter (Signed)
Pt was recommended to go back on  simvastatin 40 mg half tablet daily base on last labs Needs FLP, AST, ALT--dx hyperlipidemia, please arrange

## 2013-04-17 ENCOUNTER — Encounter: Payer: Self-pay | Admitting: *Deleted

## 2013-04-17 NOTE — Telephone Encounter (Signed)
Lab orders entered. Letter mailed to pt to make aware she is due for labs.  

## 2013-05-08 ENCOUNTER — Encounter: Payer: Self-pay | Admitting: Lab

## 2013-05-09 ENCOUNTER — Ambulatory Visit (INDEPENDENT_AMBULATORY_CARE_PROVIDER_SITE_OTHER): Payer: BC Managed Care – PPO | Admitting: Internal Medicine

## 2013-05-09 ENCOUNTER — Other Ambulatory Visit (INDEPENDENT_AMBULATORY_CARE_PROVIDER_SITE_OTHER): Payer: BC Managed Care – PPO

## 2013-05-09 ENCOUNTER — Ambulatory Visit (INDEPENDENT_AMBULATORY_CARE_PROVIDER_SITE_OTHER)
Admission: RE | Admit: 2013-05-09 | Discharge: 2013-05-09 | Disposition: A | Discharge status: Home / Self Care | Payer: BC Managed Care – PPO | Source: Ambulatory Visit | Attending: Internal Medicine | Admitting: Internal Medicine

## 2013-05-09 VITALS — BP 108/68 | HR 65 | Temp 98.5°F | Wt 153.0 lb

## 2013-05-09 DIAGNOSIS — R05 Cough: Secondary | ICD-10-CM | POA: Diagnosis not present

## 2013-05-09 DIAGNOSIS — R059 Cough, unspecified: Secondary | ICD-10-CM

## 2013-05-09 DIAGNOSIS — E785 Hyperlipidemia, unspecified: Secondary | ICD-10-CM

## 2013-05-09 LAB — LIPID PANEL
Cholesterol: 203 mg/dL — ABNORMAL HIGH (ref 0–200)
HDL: 68.7 mg/dL (ref >39.00)
Total CHOL/HDL Ratio: 3
Triglycerides: 72 mg/dL (ref 0.0–149.0)
VLDL: 14.4 mg/dL (ref 0.0–40.0)

## 2013-05-09 LAB — LDL CHOLESTEROL, DIRECT: Direct LDL: 124.3 mg/dL

## 2013-05-09 MED ORDER — HYDROCODONE-HOMATROPINE 5-1.5 MG/5ML PO SYRP: 5.0000 mL | ORAL_SOLUTION | Freq: Four times a day (QID) | ORAL | Status: DC | PRN

## 2013-05-09 MED ORDER — AZELASTINE HCL 0.1 % NA SOLN: 2.0000 | Freq: Every day | NASAL | Status: DC

## 2013-05-09 MED ORDER — DOXYCYCLINE HYCLATE 100 MG PO TABS: 100.0000 mg | ORAL_TABLET | Freq: Two times a day (BID) | ORAL | Status: DC

## 2013-05-09 NOTE — Progress Notes (Signed)
  Subjective:    Patient ID: Carol Wilson, female    DOB: Jul 04, 1946, 67 y.o.   MRN: [id number]  HPI Acute visit 4-5 weeks history of cough, persisting, day or night, unable to sleep well due to cough. The cough is dry, she does feel some chest congestion and mucus but is unable to bring it up. She is taking Zyrtec and Flonase but that is not helping much. She's not sure if she had a URI with onset of symptoms  Past Medical History  Diagnosis Date  . Depression     started ~ 2008 after she lost a daughter  . Familial tremor   . Osteopenia   . Rosacea   . Hyperlipidemia   \ Past Surgical History  Procedure Laterality Date  . Appendectomy    . Tubal ligation    . Colonoscopy     History  Substance Use Topics  . Smoking status: Never Smoker   . Smokeless tobacco: Never Used  . Alcohol Use: Yes     Comment: socially     Review of Systems She is a nonsmoker, no history of asthma. No fever or chills Admits to itchy eyes and nose but no sneezing. No postnasal dripping. She's not taking any new medications and specifically no ACE inhibitors. No heartburn or nausea.     Objective:   Physical Exam BP 108/68  Pulse 65  Temp(Src) 98.5 F (36.9 C) (Oral)  Wt 153 lb (69.4 kg)  BMI 25.46 kg/m2  SpO2 98%  General -- alert, well-developed, NAD   HEENT -- TMs normal, throat w/o redness, face symmetric and not tender to palpation,nose not congested Lungs -- normal respiratory effort, no intercostal retractions, no accessory muscle use, and normal breath sounds except when she coughs, there is audible large airway congestion   Heart-- normal rate, regular rhythm, no murmur, and no gallop.   Neurologic-- alert & oriented X3 and strength normal in all extremities. Psych-- Cognition and judgment appear intact. Alert and cooperative with normal attention span and concentration.  not anxious appearing and not depressed appearing.        Assessment & Plan:

## 2013-05-09 NOTE — Assessment & Plan Note (Addendum)
New problem. Persisting cough may be due to to an atypical infection versus allergies. No GERD symptoms, no history of asthma, she is a nonsmoker. Patient is concerned because her family history of lung cancer. Plan: see  Instructions.

## 2013-05-09 NOTE — Patient Instructions (Addendum)
Please get your x-ray at the other [place]  office located at: [address] [place], across from [facility].  Please go to the basement, this is a walk-in facility, they are open from 8:30 to 5:30 PM. Phone number [phone].  --- For cough, take Mucinex DM twice a day for 1 week then  as needed  If persistent cough, take hydrocodone, will cause drowsiness Continue flonase , add astelin nasal spray at night Stop zyrtec, claritin 10 mg OTC 1 a day Take the antibiotic as prescribed  (doxycycline) x 1 week Call if no better in few days Call anytime if the symptoms are severe

## 2013-05-10 ENCOUNTER — Encounter: Payer: Self-pay | Admitting: Internal Medicine

## 2013-05-11 ENCOUNTER — Encounter: Payer: Self-pay | Admitting: *Deleted

## 2013-05-11 ENCOUNTER — Telehealth: Payer: Self-pay | Admitting: *Deleted

## 2013-05-11 DIAGNOSIS — E785 Hyperlipidemia, unspecified: Secondary | ICD-10-CM

## 2013-05-11 MED ORDER — SIMVASTATIN 40 MG PO TABS: 40.0000 mg | ORAL_TABLET | Freq: Every day | ORAL | Status: DC

## 2013-05-11 NOTE — Telephone Encounter (Signed)
Letter mailed. Sent rx to pharmacy & entered lab orders.

## 2013-05-11 NOTE — Telephone Encounter (Signed)
Message copied by Nada Maclachlan on Fri May 11, 2013 11:03 AM ------      Message from: Willow Ora E      Created: Fri May 11, 2013 11:00 AM       See below, please send a new prescription for simvastatin and arrange for labs in 2 months                  Adaliah,      Your LDL or bad cholesterol has improved from 147 to 124, the LDL goal is 100.      Your liver tests remain normal.      I recommend to increase simvastatin 40 mg from  half tablet to one tablet every night. My nurse will send a new prescription.      Please come back in 2 months, only for labs, fasting (Will check FLP, AST, ALT DX hyperlipidemia)       ------

## 2013-06-15 ENCOUNTER — Encounter: Payer: Self-pay | Admitting: Internal Medicine

## 2013-06-28 ENCOUNTER — Telehealth: Payer: Self-pay | Admitting: Internal Medicine

## 2013-06-28 DIAGNOSIS — E785 Hyperlipidemia, unspecified: Secondary | ICD-10-CM

## 2013-06-28 DIAGNOSIS — M8580 Other specified disorders of bone density and structure, unspecified site: Secondary | ICD-10-CM

## 2013-06-28 NOTE — Telephone Encounter (Addendum)
1. Please enter a order for a bone density test, DX osteopenia;  let patient know we are ordering the test. 2. Also due for FLP, AST, ALT DX hyperlipidemia

## 2013-06-29 NOTE — Telephone Encounter (Signed)
Orders entered. lmovm for pt. To return call to make aware of orders.

## 2013-07-02 NOTE — Telephone Encounter (Signed)
lmovm to return call  °

## 2013-07-03 NOTE — Telephone Encounter (Signed)
Discussed with patient, lab apt. Scheduled. Verbalized understanding.

## 2013-07-06 ENCOUNTER — Other Ambulatory Visit (INDEPENDENT_AMBULATORY_CARE_PROVIDER_SITE_OTHER): Payer: BC Managed Care – PPO

## 2013-07-06 DIAGNOSIS — E785 Hyperlipidemia, unspecified: Secondary | ICD-10-CM

## 2013-07-06 LAB — LIPID PANEL
Cholesterol: 200 mg/dL (ref 0–200)
HDL: 52.5 mg/dL (ref >39.00)
LDL Cholesterol: 129 mg/dL — ABNORMAL HIGH (ref 0–99)
Total CHOL/HDL Ratio: 4
Triglycerides: 92 mg/dL (ref 0.0–149.0)
VLDL: 18.4 mg/dL (ref 0.0–40.0)

## 2013-07-06 LAB — ALT: ALT: 25 U/L (ref 0–35)

## 2013-07-09 ENCOUNTER — Telehealth: Payer: Self-pay | Admitting: *Deleted

## 2013-07-09 MED ORDER — EZETIMIBE 10 MG PO TABS: 10.0000 mg | ORAL_TABLET | Freq: Every day | ORAL | Status: DC

## 2013-07-09 NOTE — Telephone Encounter (Signed)
Orders placed.

## 2013-07-09 NOTE — Telephone Encounter (Signed)
Message copied by Neldon Newport on Mon Jul 09, 2013  9:09 AM ------      Message from: Willow Ora E      Created: Sun Jul 08, 2013  7:35 PM      Regarding: zetia       Send a prescription for zetia  10 mg one by mouth daily, one-year supply, you don't need to call the patient, she knows ------

## 2013-07-17 ENCOUNTER — Ambulatory Visit (INDEPENDENT_AMBULATORY_CARE_PROVIDER_SITE_OTHER)
Admission: RE | Admit: 2013-07-17 | Discharge: 2013-07-17 | Disposition: A | Discharge status: Home / Self Care | Payer: BC Managed Care – PPO | Source: Ambulatory Visit | Attending: Internal Medicine | Admitting: Internal Medicine

## 2013-07-17 DIAGNOSIS — M899 Disorder of bone, unspecified: Secondary | ICD-10-CM | POA: Diagnosis not present

## 2013-07-17 DIAGNOSIS — M8580 Other specified disorders of bone density and structure, unspecified site: Secondary | ICD-10-CM

## 2013-07-19 ENCOUNTER — Telehealth: Payer: Self-pay | Admitting: General Practice

## 2013-07-19 NOTE — Telephone Encounter (Signed)
Pt called in regards to the letter I had mailed to pt. Just wanted to verify that you do indeed want her to take both the Zetia and simvastatin?

## 2013-07-19 NOTE — Telephone Encounter (Signed)
Pt.notified

## 2013-07-19 NOTE — Telephone Encounter (Signed)
Yes, needs both. Thank you

## 2013-09-25 DIAGNOSIS — Z1231 Encounter for screening mammogram for malignant neoplasm of breast: Secondary | ICD-10-CM | POA: Diagnosis not present

## 2013-11-23 ENCOUNTER — Other Ambulatory Visit: Payer: Self-pay | Admitting: Internal Medicine

## 2013-11-23 NOTE — Telephone Encounter (Signed)
rx refilled per protocol. DJR  

## 2014-07-04 ENCOUNTER — Other Ambulatory Visit: Payer: Self-pay | Admitting: Internal Medicine

## 2014-07-09 DIAGNOSIS — D239 Other benign neoplasm of skin, unspecified: Secondary | ICD-10-CM | POA: Diagnosis not present

## 2014-07-09 DIAGNOSIS — L821 Other seborrheic keratosis: Secondary | ICD-10-CM | POA: Diagnosis not present

## 2014-07-09 DIAGNOSIS — L719 Rosacea, unspecified: Secondary | ICD-10-CM | POA: Diagnosis not present

## 2014-07-09 DIAGNOSIS — L570 Actinic keratosis: Secondary | ICD-10-CM | POA: Diagnosis not present

## 2014-07-11 ENCOUNTER — Other Ambulatory Visit: Payer: Self-pay | Admitting: Internal Medicine

## 2014-08-06 ENCOUNTER — Encounter: Payer: Self-pay | Admitting: Internal Medicine

## 2014-08-06 ENCOUNTER — Ambulatory Visit (INDEPENDENT_AMBULATORY_CARE_PROVIDER_SITE_OTHER): Payer: Medicare Other | Admitting: Internal Medicine

## 2014-08-06 VITALS — BP 108/62 | HR 69 | Temp 97.9°F | Ht 65.0 in | Wt 152.5 lb

## 2014-08-06 DIAGNOSIS — F329 Major depressive disorder, single episode, unspecified: Secondary | ICD-10-CM

## 2014-08-06 DIAGNOSIS — Z0000 Encounter for general adult medical examination without abnormal findings: Secondary | ICD-10-CM

## 2014-08-06 DIAGNOSIS — Z23 Encounter for immunization: Secondary | ICD-10-CM | POA: Diagnosis not present

## 2014-08-06 DIAGNOSIS — G250 Essential tremor: Secondary | ICD-10-CM

## 2014-08-06 DIAGNOSIS — E785 Hyperlipidemia, unspecified: Secondary | ICD-10-CM

## 2014-08-06 DIAGNOSIS — M899 Disorder of bone, unspecified: Secondary | ICD-10-CM | POA: Diagnosis not present

## 2014-08-06 DIAGNOSIS — F3289 Other specified depressive episodes: Secondary | ICD-10-CM | POA: Diagnosis not present

## 2014-08-06 DIAGNOSIS — M949 Disorder of cartilage, unspecified: Secondary | ICD-10-CM | POA: Diagnosis not present

## 2014-08-06 DIAGNOSIS — G4700 Insomnia, unspecified: Secondary | ICD-10-CM | POA: Diagnosis not present

## 2014-08-06 DIAGNOSIS — G252 Other specified forms of tremor: Secondary | ICD-10-CM

## 2014-08-06 MED ORDER — EZETIMIBE-SIMVASTATIN 10-40 MG PO TABS: 1.0000 | ORAL_TABLET | Freq: Every day | ORAL | Status: DC

## 2014-08-06 NOTE — Assessment & Plan Note (Signed)
Symptoms well-controlled, no change 

## 2014-08-06 NOTE — Assessment & Plan Note (Signed)
Symptoms gradually increasing, not limiting her activities of daily living. Again advised her to let me know when ready for neurology referral

## 2014-08-06 NOTE — Assessment & Plan Note (Signed)
Bone density test 06-2013 confirm osteopenia, on calcium and vitamin D, repeat DEXA in approximately 2 years

## 2014-08-06 NOTE — Patient Instructions (Addendum)
Go to the lab  before you leave for a UDS  Stop by the front desk and schedule labs to be done within few days (fasting): FLP, CMP, CBC --- dx  hyperlipidemia -   Take either  simvastatin and Zetia or Vytorin, do not take both  Please come back to the office in 6 months  for a routine office , no fasting     Fall Prevention and Home Safety Falls cause injuries and can affect all age groups. It is possible to use preventive measures to significantly decrease the likelihood of falls. There are many simple measures which can make your home safer and prevent falls. OUTDOORS  Repair cracks and edges of walkways and driveways.  Remove high doorway thresholds.  Trim shrubbery on the main path into your home.  Have good outside lighting.  Clear walkways of tools, rocks, debris, and clutter.  Check that handrails are not broken and are securely fastened. Both sides of steps should have handrails.  Have leaves, snow, and ice cleared regularly.  Use sand or salt on walkways during winter months.  In the garage, clean up grease or oil spills. BATHROOM  Install night lights.  Install grab bars by the toilet and in the tub and shower.  Use non-skid mats or decals in the tub or shower.  Place a plastic non-slip stool in the shower to sit on, if needed.  Keep floors dry and clean up all water on the floor immediately.  Remove soap buildup in the tub or shower on a regular basis.  Secure bath mats with non-slip, double-sided rug tape.  Remove throw rugs and tripping hazards from the floors. BEDROOMS  Install night lights.  Make sure a bedside light is easy to reach.  Do not use oversized bedding.  Keep a telephone by your bedside.  Have a firm chair with side arms to use for getting dressed.  Remove throw rugs and tripping hazards from the floor. KITCHEN  Keep handles on pots and pans turned toward the center of the stove. Use back burners when possible.  Clean up  spills quickly and allow time for drying.  Avoid walking on wet floors.  Avoid hot utensils and knives.  Position shelves so they are not too high or low.  Place commonly used objects within easy reach.  If necessary, use a sturdy step stool with a grab bar when reaching.  Keep electrical cables out of the way.  Do not use floor polish or wax that makes floors slippery. If you must use wax, use non-skid floor wax.  Remove throw rugs and tripping hazards from the floor. STAIRWAYS  Never leave objects on stairs.  Place handrails on both sides of stairways and use them. Fix any loose handrails. Make sure handrails on both sides of the stairways are as long as the stairs.  Check carpeting to make sure it is firmly attached along stairs. Make repairs to worn or loose carpet promptly.  Avoid placing throw rugs at the top or bottom of stairways, or properly secure the rug with carpet tape to prevent slippage. Get rid of throw rugs, if possible.  Have an electrician put in a light switch at the top and bottom of the stairs. OTHER FALL PREVENTION TIPS  Wear low-heel or rubber-soled shoes that are supportive and fit well. Wear closed toe shoes.  When using a stepladder, make sure it is fully opened and both spreaders are firmly locked. Do not climb a closed stepladder.  Add color or contrast paint or tape to grab bars and handrails in your home. Place contrasting color strips on first and last steps.  Learn and use mobility aids as needed. Install an electrical emergency response system.  Turn on lights to avoid dark areas. Replace light bulbs that burn out immediately. Get light switches that glow.  Arrange furniture to create clear pathways. Keep furniture in the same place.  Firmly attach carpet with non-skid or double-sided tape.  Eliminate uneven floor surfaces.  Select a carpet pattern that does not visually hide the edge of steps.  Be aware of all pets. OTHER HOME SAFETY  TIPS  Set the water temperature for 120 F (48.8 C).  Keep emergency numbers on or near the telephone.  Keep smoke detectors on every level of the home and near sleeping areas. Document Released: 10/29/2002 Document Revised: 05/09/2012 Document Reviewed: 01/28/2012 [facility] Patient Information 2015 [place], [place]. This information is not intended to replace advice given to you by your health care provider. Make sure you discuss any questions you have with your health care provider.

## 2014-08-06 NOTE — Assessment & Plan Note (Signed)
On Xanax, UDS today

## 2014-08-06 NOTE — Assessment & Plan Note (Addendum)
Td 08 Pneumonia shot - 2012  prevnar-- today shingles shot -- completed Flu shot   Never had an abnormal PAP, married twice PAP neg 2014 , Reassess 2017  Had a MMG 11-2013,  normal    No family history of colon cancer colonoscopy 2002 (-) Colonoscopy again 05/2011, next 2022 per gi  patient education: diet-exercise, dicussed

## 2014-08-06 NOTE — Progress Notes (Signed)
Subjective:    Patient ID: Carol Wilson, female    DOB: 01/10/1946, 68 y.o.   MRN: [id number]  DOS:  08/06/2014 Type of visit - description :   Here for Medicare AWV: 1. Risk factors based on Past M, S, F history: reviewed 2. Physical Activities:  Walks x4/week 3. Depression/mood: on zoloft, sx well controlled  4. Hearing:  No problemss noted or reported  5. ADL's:  Independent  6. Fall Risk: Low risk, prevention discussed 7. home Safety: does feel safe at home  8. Height, weight, & visual acuity: see VS, sees eye doctor    9. Counseling: provided 10. Labs ordered based on risk factors: if needed  11. Referral Coordination: if needed 12. Care Plan, see assessment and plan  13. Cognitive Assessment: motor skills normal except for tremor, cognition normal  In addition, today we discussed the following: In general feeling fairly well. Tremors, gradually getting worse and more noticeable. Not limiting her activities of daily living  On xanax for insomnia, good symptom control High cholesterol, good medication compliance, wonders if she could switch to Vytorin Osteopenia, on calcium and vitamin D   ROS Denies chest pain or difficulty breathing No nausea, vomiting, diarrhea. No vaginal discharge or bleeding No dysuria, gross hematuria difficulty urinating  Past Medical History  Diagnosis Date  . Depression     started ~ 2008 after she lost a daughter  . Familial tremor   . Osteopenia   . Rosacea     sees derm  . Hyperlipidemia     Past Surgical History  Procedure Laterality Date  . Appendectomy    . Tubal ligation    . Colonoscopy      History   Social History  . Marital Status: Married    Spouse Name: N/A    Number of Children: 2  . Years of Education: N/A   Occupational History  . retired-- cooks for the school [employer]  . Smoking status: Never Smoker   . Smokeless tobacco: Never Used  . Alcohol Use: Yes   Comment: socially   . Drug Use: No  . Sexual Activity: Not on file   Other Topics Concern  . Not on file   Social History Narrative   Very active--   lost her only daughter to lung cancer ~ 2008, lost a 59 old month baby years ago , no living children, takes care of  his [name], 72 y/0--     Family History  Problem Relation Age of Onset  . Lung cancer Daughter   . Diabetes Neg Hx   . Coronary artery disease Mother     M 58y/o and F 68y/o  . Hypertension Neg Hx   . Stroke Neg Hx   . Colon cancer Neg Hx   . Breast cancer Neg Hx        Medication List       This list is accurate as of: 08/06/14  6:15 PM.  Always use your most recent med list.               ALPRAZolam 0.5 MG tablet  Commonly known as:  XANAX  1/2 to 1 by mouth at bedtime as needed.     aspirin 81 MG tablet  Take 81 mg by mouth daily.     azelastine 0.1 % nasal spray  Commonly known as:  ASTELIN  Place 2 sprays into the nose at bedtime. Use in each  nostril as directed     CALCIUM PO  Take 1 tablet by mouth daily.     CO Q 10 PO  Take 1 tablet by mouth daily.     ezetimibe-simvastatin 10-40 MG per tablet  Commonly known as:  VYTORIN  Take 1 tablet by mouth daily.     metroNIDAZOLE 1 % gel  Commonly known as:  METROGEL  Apply 1 application topically daily.     propranolol 10 MG tablet  Commonly known as:  INDERAL  take 2 tablets by mouth once daily     sertraline 100 MG tablet  Commonly known as:  ZOLOFT  take 1 tablet by mouth once daily     simvastatin 40 MG tablet  Commonly known as:  ZOCOR  take 1/2 tablet by mouth at bedtime     VITAMIN D (CHOLECALCIFEROL) PO  Take 1 tablet by mouth daily.     ZETIA 10 MG tablet  Generic drug:  ezetimibe  take 1 tablet by mouth once daily           Objective:   Physical Exam BP 108/62  Pulse 69  Temp(Src) 97.9 F (36.6 C) (Oral)  Ht 5\' 5"  (1.651 m)  Wt 152 lb 8 oz (69.174 kg)  BMI 25.38 kg/m2  SpO2 97%  General -- alert,  well-developed, NAD.  Neck --no thyromegaly  HEENT-- Not pale.  Lungs -- normal respiratory effort, no intercostal retractions, no accessory muscle use, and normal breath sounds.  Heart-- normal rate, regular rhythm, no murmur.  Abdomen-- Not distended, good bowel sounds,soft, non-tender. No bruit.  Extremities-- no pretibial edema bilaterally  Neurologic--  alert & oriented X3. Speech normal except for some tremor, gait appropriate for age, strength symmetric and appropriate for age. Tremor noted  Psych-- Cognition and judgment appear intact. Cooperative with normal attention span and concentration. No anxious or depressed appearing.     Assessment & Plan:

## 2014-08-06 NOTE — Progress Notes (Signed)
Pre visit review using our clinic review tool, if applicable. No additional management support is needed unless otherwise documented below in the visit note. 

## 2014-08-06 NOTE — Assessment & Plan Note (Signed)
On zetia and simvastatin, would like Vytorin, prescription provided, see instructions

## 2014-08-09 ENCOUNTER — Other Ambulatory Visit (INDEPENDENT_AMBULATORY_CARE_PROVIDER_SITE_OTHER): Payer: Medicare Other

## 2014-08-09 DIAGNOSIS — Z0000 Encounter for general adult medical examination without abnormal findings: Secondary | ICD-10-CM

## 2014-08-09 DIAGNOSIS — E785 Hyperlipidemia, unspecified: Secondary | ICD-10-CM

## 2014-08-09 LAB — LIPID PANEL
CHOLESTEROL: 160 mg/dL (ref 0–200)
HDL: 55.6 mg/dL (ref >39.00)
LDL Cholesterol: 92 mg/dL (ref 0–99)
NonHDL: 104.4
TRIGLYCERIDES: 64 mg/dL (ref 0.0–149.0)
Total CHOL/HDL Ratio: 3
VLDL: 12.8 mg/dL (ref 0.0–40.0)

## 2014-08-09 LAB — COMPREHENSIVE METABOLIC PANEL
ALT: 19 U/L (ref 0–35)
AST: 26 U/L (ref 0–37)
Albumin: 4.3 g/dL (ref 3.5–5.2)
Alkaline Phosphatase: 67 U/L (ref 39–117)
BUN: 14 mg/dL (ref 6–23)
CO2: 29 mEq/L (ref 19–32)
Calcium: 10.1 mg/dL (ref 8.4–10.5)
Chloride: 106 mEq/L (ref 96–112)
Creatinine, Ser: 0.9 mg/dL (ref 0.4–1.2)
GFR: 63.65 mL/min (ref >60.00)
GLUCOSE: 100 mg/dL — AB (ref 70–99)
Potassium: 4.2 mEq/L (ref 3.5–5.1)
Sodium: 139 mEq/L (ref 135–145)
TOTAL PROTEIN: 7.5 g/dL (ref 6.0–8.3)
Total Bilirubin: 0.5 mg/dL (ref 0.2–1.2)

## 2014-08-09 LAB — CBC
HCT: 40.2 % (ref 36.0–46.0)
Hemoglobin: 13.4 g/dL (ref 12.0–15.0)
MCHC: 33.4 g/dL (ref 30.0–36.0)
MCV: 89.2 fl (ref 78.0–100.0)
Platelets: 254 10*3/uL (ref 150.0–400.0)
RBC: 4.51 Mil/uL (ref 3.87–5.11)
RDW: 13 % (ref 11.5–15.5)
WBC: 4.2 10*3/uL (ref 4.0–10.5)

## 2014-08-15 ENCOUNTER — Telehealth: Payer: Self-pay

## 2014-08-15 NOTE — Telephone Encounter (Signed)
UDS: 08/06/2014  Negative for Xanax: PRN  Low Risk per Dr. Larose Kells 08/15/2014

## 2014-08-21 ENCOUNTER — Encounter: Payer: Self-pay | Admitting: Internal Medicine

## 2014-12-31 ENCOUNTER — Other Ambulatory Visit: Payer: Self-pay

## 2014-12-31 ENCOUNTER — Telehealth: Payer: Self-pay | Admitting: Internal Medicine

## 2014-12-31 MED ORDER — EZETIMIBE-SIMVASTATIN 10-40 MG PO TABS: 1.0000 | ORAL_TABLET | Freq: Every day | ORAL | Status: DC

## 2014-12-31 MED ORDER — SIMVASTATIN 40 MG PO TABS: 20.0000 mg | ORAL_TABLET | Freq: Every day | ORAL | Status: DC

## 2014-12-31 MED ORDER — SERTRALINE HCL 100 MG PO TABS: 100.0000 mg | ORAL_TABLET | Freq: Every day | ORAL | Status: DC

## 2014-12-31 MED ORDER — EZETIMIBE 10 MG PO TABS: 10.0000 mg | ORAL_TABLET | Freq: Every day | ORAL | Status: DC

## 2014-12-31 MED ORDER — PROPRANOLOL HCL 10 MG PO TABS
20.0000 mg | ORAL_TABLET | Freq: Every day | ORAL | Status: DC
Start: 2014-12-31 — End: 2015-08-01

## 2014-12-31 NOTE — Telephone Encounter (Signed)
Refills sent to [facility] as requested.

## 2014-12-31 NOTE — Telephone Encounter (Signed)
Caller name: Guiselle Relation to pt: self Call back number:  [phone] Pharmacy: [facility]  Reason for call:   Patient requesting all medications to be sent to mail order pharmacy. This is her new pharmacy

## 2015-01-01 ENCOUNTER — Other Ambulatory Visit: Payer: Self-pay | Admitting: Internal Medicine

## 2015-02-12 ENCOUNTER — Ambulatory Visit: Payer: Medicare Other | Admitting: Internal Medicine

## 2015-02-18 ENCOUNTER — Ambulatory Visit (INDEPENDENT_AMBULATORY_CARE_PROVIDER_SITE_OTHER): Payer: Medicare PPO | Admitting: Internal Medicine

## 2015-02-18 ENCOUNTER — Encounter: Payer: Self-pay | Admitting: Internal Medicine

## 2015-02-18 VITALS — BP 118/64 | HR 78 | Temp 98.0°F | Ht 65.0 in | Wt 152.4 lb

## 2015-02-18 DIAGNOSIS — J309 Allergic rhinitis, unspecified: Secondary | ICD-10-CM | POA: Insufficient documentation

## 2015-02-18 DIAGNOSIS — G4700 Insomnia, unspecified: Secondary | ICD-10-CM | POA: Diagnosis not present

## 2015-02-18 DIAGNOSIS — E785 Hyperlipidemia, unspecified: Secondary | ICD-10-CM

## 2015-02-18 DIAGNOSIS — R251 Tremor, unspecified: Secondary | ICD-10-CM | POA: Diagnosis not present

## 2015-02-18 DIAGNOSIS — F329 Major depressive disorder, single episode, unspecified: Secondary | ICD-10-CM | POA: Diagnosis not present

## 2015-02-18 DIAGNOSIS — J301 Allergic rhinitis due to pollen: Secondary | ICD-10-CM

## 2015-02-18 DIAGNOSIS — F32A Depression, unspecified: Secondary | ICD-10-CM

## 2015-02-18 DIAGNOSIS — G252 Other specified forms of tremor: Secondary | ICD-10-CM

## 2015-02-18 DIAGNOSIS — G250 Essential tremor: Secondary | ICD-10-CM

## 2015-02-18 NOTE — Progress Notes (Signed)
Pre visit review using our clinic review tool, if applicable. No additional management support is needed unless otherwise documented below in the visit note. 

## 2015-02-18 NOTE — Assessment & Plan Note (Signed)
Long history of seasonal allergies currently taking Zyrtec and Astelin, symptoms are okay but there is room for improvement. Recommend to add OTC Flonase and let me know if that is  not working

## 2015-02-18 NOTE — Assessment & Plan Note (Signed)
Symptoms well controlled with Zoloft and occasional use of benzodiazepines

## 2015-02-18 NOTE — Assessment & Plan Note (Signed)
Slightly more noticeable symptoms this year, we again discussed about possibly seeing neurology, she prefers to wait, continue with beta blockers

## 2015-02-18 NOTE — Progress Notes (Signed)
Subjective:    Patient ID: Carol Wilson, female    DOB: [birth year], 69 y.o.   MRN: [id number]  DOS:  02/18/2015 Type of visit - description : rov Interval history: Chol -- Taking vytorin instead of both Zetia and Zocor. No signs of PVD, no notable side effects from medications.  Tremor --  symptoms a slightly more noticeable this year. Insomnia -- Still notable, not getting better nor getting worse. Takes xanax occasionally to fall asleep with good results. Allergies, relatively well controlled on Zyrtec, needs a refill on Astelin   Review of Systems   No CP, SOB, no wheezing No palpitations, no lower extremity edema Denies nausea, vomiting diarrhea, blood in the stools No abdominal pain No dysphagia or odynophagia     Past Medical History  Diagnosis Date  . Depression     started ~ 2008 after she lost a daughter  . Familial tremor   . Osteopenia   . Rosacea     sees derm  . Hyperlipidemia     Past Surgical History  Procedure Laterality Date  . Appendectomy    . Tubal ligation    . Colonoscopy      History   Social History  . Marital Status: Married    Spouse Name: N/A  . Number of Children: 2  . Years of Education: N/A   Occupational History  . retired-- cooks for the school [employer]  . Smoking status: Never Smoker   . Smokeless tobacco: Never Used  . Alcohol Use: Yes     Comment: socially   . Drug Use: No  . Sexual Activity: Not on file   Other Topics Concern  . Not on file   Social History Narrative   Very active--   lost her only daughter to lung cancer ~ 2008, lost a 57 old month baby years ago , no living children, takes care of  his [name], 35 y/0--        Medication List       This list is accurate as of: 02/18/15  1:25 PM.  Always use your most recent med list.               ALPRAZolam 0.5 MG tablet  Commonly known as:  XANAX  1/2 to 1 by mouth at bedtime as needed.     aspirin 81 MG  tablet  Take 81 mg by mouth daily.     azelastine 0.1 % nasal spray  Commonly known as:  ASTELIN  Place 2 sprays into the nose at bedtime. Use in each nostril as directed     CALCIUM PO  Take 1 tablet by mouth daily.     CO Q 10 PO  Take 1 tablet by mouth daily.     ezetimibe 10 MG tablet  Commonly known as:  ZETIA  Take 1 tablet (10 mg total) by mouth daily.     ZETIA 10 MG tablet  Generic drug:  ezetimibe  take 1 tablet by mouth once daily     ezetimibe-simvastatin 10-40 MG per tablet  Commonly known as:  VYTORIN  Take 1 tablet by mouth daily.     metroNIDAZOLE 1 % gel  Commonly known as:  METROGEL  Apply 1 application topically daily.     propranolol 10 MG tablet  Commonly known as:  INDERAL  Take 2 tablets (20 mg total) by mouth daily.     sertraline 100 MG tablet  Commonly known as:  ZOLOFT  Take 1 tablet (100 mg total) by mouth daily.     simvastatin 40 MG tablet  Commonly known as:  ZOCOR  Take 0.5 tablets (20 mg total) by mouth at bedtime.     VITAMIN D (CHOLECALCIFEROL) PO  Take 1 tablet by mouth daily.           Objective:   Physical Exam BP 118/64 mmHg  Pulse 78  Temp(Src) 98 F (36.7 C) (Oral)  Ht 5\' 5"  (1.651 m)  Wt 152 lb 6 oz (69.117 kg)  BMI 25.36 kg/m2  SpO2 96%  General:   Well developed, well nourished . NAD.  HEENT:  Normocephalic . Face symmetric, atraumatic Lungs:  CTA B Normal respiratory effort, no intercostal retractions, no accessory muscle use. Heart: RRR,  no murmur.  Muscle skeletal: no pretibial edema bilaterally  Skin: Not pale. Not jaundice Neurologic:  alert & oriented X3.  Speech normal, gait appropriate for age and unassisted Tremor slightly more noticeable in the hands and jaw this year. Her voice is also starting to be affected. Psych--  Cognition and judgment appear intact.  Cooperative with normal attention span and concentration.  Behavior appropriate. No anxious or depressed appearing.        Assessment & Plan:

## 2015-02-18 NOTE — Patient Instructions (Signed)
For allergies, continue with Zyrtec, Astelin and you can also use over-the-counter Flonase nasal spray.  Come back to the office by 07-2015  for a physical exam  Please schedule an appointment at the front desk    Come back fasting

## 2015-02-18 NOTE — Assessment & Plan Note (Signed)
Switched to vytorin instead of zocor and zetia separately.  Lipids have been good so no need to check them today. Plan: Continue current regimen of medication and follow up in 6 months.

## 2015-02-18 NOTE — Assessment & Plan Note (Signed)
Occasionally uses Xanax with good results

## 2015-02-18 NOTE — Progress Notes (Deleted)
Subjective:    Patient ID: Carol Wilson, female    DOB: 1946/09/28, 69 y.o.   MRN: [id number]  DOS:  02/18/2015 Type of visit - description :  Interval history:    Review of Systems   Past Medical History  Diagnosis Date  . Depression     started ~ 2008 after she lost a daughter  . Familial tremor   . Osteopenia   . Rosacea     sees derm  . Hyperlipidemia     Past Surgical History  Procedure Laterality Date  . Appendectomy    . Tubal ligation    . Colonoscopy      History   Social History  . Marital Status: Married    Spouse Name: N/A  . Number of Children: 2  . Years of Education: N/A   Occupational History  . 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 Smoking status: Never Smoker   . Smokeless tobacco: Never Used  . Alcohol Use: Yes     Comment: socially   . Drug Use: No  . Sexual Activity: Not on file   Other Topics Concern  . Not on file   Social History Narrative   Very active--   lost her only daughter to lung cancer ~ 2008, lost a 54 old month baby years ago , no living children, takes care of  his [name], 13 y/0--        Medication List       This list is accurate as of: 02/18/15  1:25 PM.  Always use your most recent med list.               ALPRAZolam 0.5 MG tablet  Commonly known as:  XANAX  1/2 to 1 by mouth at bedtime as needed.     aspirin 81 MG tablet  Take 81 mg by mouth daily.     azelastine 0.1 % nasal spray  Commonly known as:  ASTELIN  Place 2 sprays into the nose at bedtime. Use in each nostril as directed     CALCIUM PO  Take 1 tablet by mouth daily.     CO Q 10 PO  Take 1 tablet by mouth daily.     ezetimibe 10 MG tablet  Commonly known as:  ZETIA  Take 1 tablet (10 mg total) by mouth daily.     ZETIA 10 MG tablet  Generic drug:  ezetimibe  take 1 tablet by mouth once daily     ezetimibe-simvastatin 10-40 MG per tablet  Commonly known as:  VYTORIN  Take 1 tablet  by mouth daily.     metroNIDAZOLE 1 % gel  Commonly known as:  METROGEL  Apply 1 application topically daily.     propranolol 10 MG tablet  Commonly known as:  INDERAL  Take 2 tablets (20 mg total) by mouth daily.     sertraline 100 MG tablet  Commonly known as:  ZOLOFT  Take 1 tablet (100 mg total) by mouth daily.     simvastatin 40 MG tablet  Commonly known as:  ZOCOR  Take 0.5 tablets (20 mg total) by mouth at bedtime.     VITAMIN D (CHOLECALCIFEROL) PO  Take 1 tablet by mouth daily.           Objective:   Physical Exam BP 118/64 mmHg  Pulse 78  Temp(Src) 98 F (36.7 C) (Oral)  Ht 5\' 5"  (1.651 m)  Wt 152 lb 6 oz (69.117 kg)  BMI 25.36 kg/m2  SpO2 96%       Assessment & Plan:

## 2015-03-03 ENCOUNTER — Other Ambulatory Visit: Payer: Self-pay

## 2015-06-09 ENCOUNTER — Encounter: Payer: Self-pay | Admitting: Internal Medicine

## 2015-07-06 ENCOUNTER — Other Ambulatory Visit: Payer: Self-pay | Admitting: Internal Medicine

## 2015-08-01 ENCOUNTER — Other Ambulatory Visit: Payer: Self-pay | Admitting: Internal Medicine

## 2015-08-08 ENCOUNTER — Other Ambulatory Visit: Payer: Self-pay | Admitting: Internal Medicine

## 2015-08-11 ENCOUNTER — Telehealth: Payer: Self-pay | Admitting: *Deleted

## 2015-08-11 NOTE — Telephone Encounter (Signed)
Unable to reach patient at time of Pre-Visit Call.  Left message for patient to return call when available.    

## 2015-08-13 ENCOUNTER — Encounter: Payer: Self-pay | Admitting: Internal Medicine

## 2015-08-13 ENCOUNTER — Ambulatory Visit (INDEPENDENT_AMBULATORY_CARE_PROVIDER_SITE_OTHER): Payer: Medicare PPO | Admitting: Internal Medicine

## 2015-08-13 VITALS — BP 116/70 | HR 52 | Temp 98.0°F | Ht 65.0 in | Wt 158.0 lb

## 2015-08-13 DIAGNOSIS — Z0000 Encounter for general adult medical examination without abnormal findings: Secondary | ICD-10-CM

## 2015-08-13 DIAGNOSIS — Z23 Encounter for immunization: Secondary | ICD-10-CM

## 2015-08-13 DIAGNOSIS — Z09 Encounter for follow-up examination after completed treatment for conditions other than malignant neoplasm: Secondary | ICD-10-CM

## 2015-08-13 DIAGNOSIS — E785 Hyperlipidemia, unspecified: Secondary | ICD-10-CM | POA: Diagnosis not present

## 2015-08-13 DIAGNOSIS — Z1159 Encounter for screening for other viral diseases: Secondary | ICD-10-CM | POA: Diagnosis not present

## 2015-08-13 DIAGNOSIS — M8580 Other specified disorders of bone density and structure, unspecified site: Secondary | ICD-10-CM

## 2015-08-13 DIAGNOSIS — Z79899 Other long term (current) drug therapy: Secondary | ICD-10-CM | POA: Diagnosis not present

## 2015-08-13 LAB — BASIC METABOLIC PANEL
BUN: 14 mg/dL (ref 6–23)
CHLORIDE: 105 meq/L (ref 96–112)
CO2: 32 mEq/L (ref 19–32)
Calcium: 10.1 mg/dL (ref 8.4–10.5)
Creatinine, Ser: 0.82 mg/dL (ref 0.40–1.20)
GFR: 73.39 mL/min (ref >60.00)
GLUCOSE: 90 mg/dL (ref 70–99)
POTASSIUM: 4.3 meq/L (ref 3.5–5.1)
SODIUM: 140 meq/L (ref 135–145)

## 2015-08-13 LAB — LIPID PANEL
CHOL/HDL RATIO: 3
CHOLESTEROL: 151 mg/dL (ref 0–200)
HDL: 59.2 mg/dL (ref >39.00)
LDL CALC: 77 mg/dL (ref 0–99)
NonHDL: 91.48
TRIGLYCERIDES: 70 mg/dL (ref 0.0–149.0)
VLDL: 14 mg/dL (ref 0.0–40.0)

## 2015-08-13 LAB — AST: AST: 25 U/L (ref 0–37)

## 2015-08-13 LAB — ALT: ALT: 20 U/L (ref 0–35)

## 2015-08-13 MED ORDER — FLUTICASONE PROPIONATE 50 MCG/ACT NA SUSP: 2.0000 | Freq: Every day | NASAL | Status: DC

## 2015-08-13 NOTE — Assessment & Plan Note (Addendum)
Td 08; Pneumonia shot - 2012 ; prevnar 2015; shingles shot -- completed Flu shot today (no high dose available, elected regular dose) Cervical cancer screening: Never had an abnormal PAP, married twice.PAP neg 2014 , Reassess 2017.  Breast cancer screening: last MMG 11-2013, no records , reportedly normal  ; rec to have another one 2016 Breast exam 07-2015 normal  CCS: no FH; colonoscopy 2002 (-) ;   Colonoscopy again 05/2011, next 2022 per gi  Labs: Hep C screening patient education: diet-exercise, dicussed

## 2015-08-13 NOTE — Progress Notes (Signed)
Pre visit review using our clinic review tool, if applicable. No additional management support is needed unless otherwise documented below in the visit note. 

## 2015-08-13 NOTE — Patient Instructions (Addendum)
Get your blood work before you leave . We also need a UDS   We will schedule a bone density test  Please be sure you have a mammogram this year  Next visit in one year, fasting for another physical exam.     Fall Prevention and Home Safety Falls cause injuries and can affect all age groups. It is possible to use preventive measures to significantly decrease the likelihood of falls. There are many simple measures which can make your home safer and prevent falls. OUTDOORS  Repair cracks and edges of walkways and driveways.  Remove high doorway thresholds.  Trim shrubbery on the main path into your home.  Have good outside lighting.  Clear walkways of tools, rocks, debris, and clutter.  Check that handrails are not broken and are securely fastened. Both sides of steps should have handrails.  Have leaves, snow, and ice cleared regularly.  Use sand or salt on walkways during winter months.  In the garage, clean up grease or oil spills. BATHROOM  Install night lights.  Install grab bars by the toilet and in the tub and shower.  Use non-skid mats or decals in the tub or shower.  Place a plastic non-slip stool in the shower to sit on, if needed.  Keep floors dry and clean up all water on the floor immediately.  Remove soap buildup in the tub or shower on a regular basis.  Secure bath mats with non-slip, double-sided rug tape.  Remove throw rugs and tripping hazards from the floors. BEDROOMS  Install night lights.  Make sure a bedside light is easy to reach.  Do not use oversized bedding.  Keep a telephone by your bedside.  Have a firm chair with side arms to use for getting dressed.  Remove throw rugs and tripping hazards from the floor. KITCHEN  Keep handles on pots and pans turned toward the center of the stove. Use back burners when possible.  Clean up spills quickly and allow time for drying.  Avoid walking on wet floors.  Avoid hot utensils and  knives.  Position shelves so they are not too high or low.  Place commonly used objects within easy reach.  If necessary, use a sturdy step stool with a grab bar when reaching.  Keep electrical cables out of the way.  Do not use floor polish or wax that makes floors slippery. If you must use wax, use non-skid floor wax.  Remove throw rugs and tripping hazards from the floor. STAIRWAYS  Never leave objects on stairs.  Place handrails on both sides of stairways and use them. Fix any loose handrails. Make sure handrails on both sides of the stairways are as long as the stairs.  Check carpeting to make sure it is firmly attached along stairs. Make repairs to worn or loose carpet promptly.  Avoid placing throw rugs at the top or bottom of stairways, or properly secure the rug with carpet tape to prevent slippage. Get rid of throw rugs, if possible.  Have an electrician put in a light switch at the top and bottom of the stairs. OTHER FALL PREVENTION TIPS  Wear low-heel or rubber-soled shoes that are supportive and fit well. Wear closed toe shoes.  When using a stepladder, make sure it is fully opened and both spreaders are firmly locked. Do not climb a closed stepladder.  Add color or contrast paint or tape to grab bars and handrails in your home. Place contrasting color strips on first and last steps.  Learn and use mobility aids as needed. Install an electrical emergency response system.  Turn on lights to avoid dark areas. Replace light bulbs that burn out immediately. Get light switches that glow.  Arrange furniture to create clear pathways. Keep furniture in the same place.  Firmly attach carpet with non-skid or double-sided tape.  Eliminate uneven floor surfaces.  Select a carpet pattern that does not visually hide the edge of steps.  Be aware of all pets. OTHER HOME SAFETY TIPS  Set the water temperature for 120 F (48.8 C).  Keep emergency numbers on or near the  telephone.  Keep smoke detectors on every level of the home and near sleeping areas. Document Released: 10/29/2002 Document Revised: 05/09/2012 Document Reviewed: 01/28/2012 [facility] Patient Information 2015 [place], [place]. This information is not intended to replace advice given to you by your health care provider. Make sure you discuss any questions you have with your health care provider.   Preventive Care for Adults Ages 77 and over  Blood pressure check.** / Every 1 to 2 years.  Lipid and cholesterol check.**/ Every 5 years beginning at age 18.  Lung cancer screening. / Every year if you are aged 61-80 years and have a 30-pack-year history of smoking and currently smoke or have quit within the past 15 years. Yearly screening is stopped once you have quit smoking for at least 15 years or develop a health problem that would prevent you from having lung cancer treatment.  Fecal occult blood test (FOBT) of stool. / Every year beginning at age 97 and continuing until age 20. You may not have to do this test if you get a colonoscopy every 10 years.  Flexible sigmoidoscopy** or colonoscopy.** / Every 5 years for a flexible sigmoidoscopy or every 10 years for a colonoscopy beginning at age 92 and continuing until age 59.  Hepatitis C blood test.** / For all people born from [birth year] through 1965 and any individual with known risks for hepatitis C.  Abdominal aortic aneurysm (AAA) screening.** / A one-time screening for ages 16 to 79 years who are current or former smokers.  Skin self-exam. / Monthly.  Influenza vaccine. / Every year.  Tetanus, diphtheria, and acellular pertussis (Tdap/Td) vaccine.** / 1 dose of Td every 10 years.  Varicella vaccine.** / Consult your health care provider.  Zoster vaccine.** / 1 dose for adults aged 51 years or older.  Pneumococcal 13-valent conjugate (PCV13) vaccine.** / Consult your health care provider.  Pneumococcal polysaccharide (PPSV23) vaccine.**  / 1 dose for all adults aged 21 years and older.  Meningococcal vaccine.** / Consult your health care provider.  Hepatitis A vaccine.** / Consult your health care provider.  Hepatitis B vaccine.** / Consult your health care provider.  Haemophilus influenzae type b (Hib) vaccine.** / Consult your health care provider. **Family history and personal history of risk and conditions may change your health care provider's recommendations. Document Released: 01/04/2002 Document Revised: 11/13/2013 Document Reviewed: 04/05/2011 [facility] Patient Information 2015 [place], [place]. This information is not intended to replace advice given to you by your health care provider. Make sure you discuss any questions you have with your health care provider.

## 2015-08-13 NOTE — Progress Notes (Signed)
Subjective:    Patient ID: Carol Wilson, female    DOB: 1946/11/16, 69 y.o.   MRN: [id number]  DOS:  08/13/2015 Type of visit - description :    Here for Medicare AWV: 1. Risk factors based on Past M, S, F history: reviewed 2. Physical Activities: Walks x4/week 3. Depression/mood: on zoloft, sx well controlled  4. Hearing: No problemss noted or reported  5. ADL's: Independent  6. Fall Risk: no recent falls, prevention discussed 7. home Safety: does feel safe at home  8. Height, weight, & visual acuity: see VS, sees eye doctor  9. Counseling: provided 10. Labs ordered based on risk factors: if needed  11. Referral Coordination: if needed 12. Care Plan, see assessment and plan  13. Cognitive Assessment: motor skills normal except for tremor, cognition normal 14. Providers list updated-- sees dermatology, name? 71. End-of-life care discussed-- has a HC-POA  In addition, today we discussed the following: Depression: On Zoloft and Xanax: Symptoms well controlled, takes xanax very rarely tremor: Has not progressed, not affecting her ADLs  except writing is difficult Hyperlipidemia: Good compliance with medication without apparent side effects.  Review of Systems Constitutional: No fever. No chills. No unexplained wt changes. No unusual sweats  HEENT: No dental problems, no ear discharge, no facial swelling, no voice changes. No eye discharge, no eye  redness , no  intolerance to light   Respiratory: No wheezing , no  difficulty breathing. No cough , no mucus production  Cardiovascular: No CP, no leg swelling , no  Palpitations  GI: no nausea, no vomiting, no diarrhea , no  abdominal pain.  No blood in the stools. No dysphagia, no odynophagia    Endocrine: No polyphagia, no polyuria , no polydipsia  GU: No dysuria, gross hematuria, difficulty urinating. No urinary urgency, no frequency.  Musculoskeletal: No joint swellings or unusual aches or pains  Skin: No  change in the color of the skin, palor , no  Rash  Allergic, immunologic: No environmental allergies , no  food allergies  Neurological: No dizziness no  syncope. No headaches. No diplopia, no slurred, no slurred speech, no motor deficits, no facial  Numbness  Hematological: No enlarged lymph nodes, no easy bruising , no unusual bleedings  Psychiatry: No suicidal ideas, no hallucinations, no beavior problems, no confusion.  No unusual/severe anxiety, no depression   Past Medical History  Diagnosis Date  . Depression     started ~ 2008 after she lost a daughter  . Familial tremor   . Osteopenia   . Rosacea     sees derm  . Hyperlipidemia     Past Surgical History  Procedure Laterality Date  . Appendectomy    . Tubal ligation    . Colonoscopy      Social History   Social History  . Marital Status: Married    Spouse Name: N/A  . Number of Children: 2  . Years of Education: N/A   Occupational History  . retired-- cooks for the school [employer]  . Smoking status: Never Smoker   . Smokeless tobacco: Never Used  . Alcohol Use: Yes     Comment: socially   . Drug Use: No  . Sexual Activity: Not on file   Other Topics Concern  . Not on file   Social History Narrative   Very active--   lost her only daughter to lung cancer ~ 2008, lost a 66 old month baby  years ago , no living children, takes care of  his Gchild, 28 y/0--     Family History  Problem Relation Age of Onset  . Lung cancer Daughter   . Diabetes Neg Hx   . Coronary artery disease Mother     M 58y/o and F 68y/o  . Hypertension Neg Hx   . Stroke Neg Hx   . Colon cancer Neg Hx   . Breast cancer Neg Hx        Medication List       This list is accurate as of: 08/13/15  9:09 AM.  Always use your most recent med list.               ALPRAZolam 0.5 MG tablet  Commonly known as:  XANAX  1/2 to 1 by mouth at bedtime as needed.     aspirin 81 MG tablet  Take  81 mg by mouth daily.     azelastine 0.1 % nasal spray  Commonly known as:  ASTELIN  Place 2 sprays into the nose at bedtime. Use in each nostril as directed     CALCIUM PO  Take 1 tablet by mouth daily.     CO Q 10 PO  Take 1 tablet by mouth daily.     ezetimibe-simvastatin 10-40 MG per tablet  Commonly known as:  VYTORIN  Take 1 tablet by mouth daily.     metroNIDAZOLE 1 % gel  Commonly known as:  METROGEL  Apply 1 application topically daily.     propranolol 10 MG tablet  Commonly known as:  INDERAL  Take 2 tablets (20 mg total) by mouth daily.     sertraline 100 MG tablet  Commonly known as:  ZOLOFT  Take 1 tablet (100 mg total) by mouth daily.     VITAMIN D (CHOLECALCIFEROL) PO  Take 1 tablet by mouth daily.           Objective:   Physical Exam BP 116/70 mmHg  Pulse 52  Temp(Src) 98 F (36.7 C) (Oral)  Ht 5\' 5"  (1.651 m)  Wt 158 lb (71.668 kg)  BMI 26.29 kg/m2  SpO2 98% General:   Well developed, well nourished . NAD.  HEENT:  Normocephalic . Face symmetric, atraumatic. Minimal voice tremors Lungs:  CTA B Normal respiratory effort, no intercostal retractions, no accessory muscle use. Heart: RRR,  no murmur.  no pretibial edema bilaterally  Breast: no dominant mass, skin and nipples normal to inspection on palpation, axillary areas without mass or lymphadenopathy abdomen:  Not distended, soft, non-tender. No rebound or rigidity. No mass,organomegaly Skin: Not pale. Not jaundice Neurologic:  alert & oriented X3.  Speech normal, gait appropriate for age and unassisted. Mild hind tremor noted Psych--  Cognition and judgment appear intact.  Cooperative with normal attention span and concentration.  Behavior appropriate. No anxious or depressed appearing.    Assessment & Plan:   Assessment> Hyperlipidemia  Depression , insomnia --onset 2008, lost a daughter Familial tremor Osteopenia dexa 2014 Rosacea   Sees dermatology Allergic  rhinitis  Plan  Hyperlipidemia: On Vytorin, no apparent side effects, check FLP, AST, ALT, FLP. Depression, insomnia Well controlled with Zoloft, uses Xanax rarely, check a UDS Osteopenia: Repeat a bone density test currently on calcium and vitamin D Tremors: Currently stable on beta blockers. Allergic rhinitis: Taking OTCs (Afrin?) Recommend to use either a saline spray no Flonase. Prescription sent.

## 2015-08-14 DIAGNOSIS — Z09 Encounter for follow-up examination after completed treatment for conditions other than malignant neoplasm: Secondary | ICD-10-CM | POA: Insufficient documentation

## 2015-08-14 LAB — HEPATITIS C ANTIBODY: HCV Ab: NEGATIVE

## 2015-08-14 NOTE — Assessment & Plan Note (Signed)
  Hyperlipidemia: On Vytorin, no apparent side effects, check FLP, AST, ALT, FLP. Depression, insomnia Well controlled with Zoloft, uses Xanax rarely, check a UDS Osteopenia: Repeat a bone density test currently on calcium and vitamin D Tremors: Currently stable on beta blockers. Allergic rhinitis: Taking OTCs (Afrin?) Recommend to use either a saline spray no Flonase. Prescription sent.

## 2015-09-01 ENCOUNTER — Telehealth: Payer: Self-pay

## 2015-09-01 NOTE — Telephone Encounter (Signed)
UDS: 08/13/2015  Negative for Alprazolam: PRN Positive for Sertraline   Low risk per Dr. Larose Kells 08/31/2015 Next UDS in 1-2 years per Dr. Larose Kells

## 2015-09-30 ENCOUNTER — Other Ambulatory Visit: Payer: Self-pay | Admitting: Internal Medicine

## 2016-01-14 ENCOUNTER — Ambulatory Visit (INDEPENDENT_AMBULATORY_CARE_PROVIDER_SITE_OTHER)
Admission: RE | Admit: 2016-01-14 | Discharge: 2016-01-14 | Disposition: A | Discharge status: Home / Self Care | Payer: Medicare Other | Source: Ambulatory Visit | Attending: Internal Medicine | Admitting: Internal Medicine

## 2016-01-14 DIAGNOSIS — M8580 Other specified disorders of bone density and structure, unspecified site: Secondary | ICD-10-CM | POA: Diagnosis not present

## 2016-02-20 ENCOUNTER — Ambulatory Visit: Payer: Medicare Other

## 2016-02-23 ENCOUNTER — Other Ambulatory Visit: Payer: Self-pay | Admitting: Internal Medicine

## 2016-02-23 ENCOUNTER — Ambulatory Visit (HOSPITAL_BASED_OUTPATIENT_CLINIC_OR_DEPARTMENT_OTHER)
Admission: RE | Admit: 2016-02-23 | Discharge: 2016-02-23 | Disposition: A | Discharge status: Home / Self Care | Payer: Medicare Other | Source: Ambulatory Visit | Attending: Internal Medicine | Admitting: Internal Medicine

## 2016-02-23 DIAGNOSIS — Z1231 Encounter for screening mammogram for malignant neoplasm of breast: Secondary | ICD-10-CM | POA: Insufficient documentation

## 2016-02-23 NOTE — Progress Notes (Signed)
Pre visit review using our clinic review tool, if applicable. No additional management support is needed unless otherwise documented below in the visit note. 

## 2016-02-23 NOTE — Progress Notes (Signed)
AWV scheduled too soon.  Pt was advised to reschedule and to complete mammogram before next AWV.

## 2016-08-13 ENCOUNTER — Encounter: Payer: Self-pay | Admitting: Internal Medicine

## 2016-08-13 ENCOUNTER — Ambulatory Visit (INDEPENDENT_AMBULATORY_CARE_PROVIDER_SITE_OTHER): Payer: Medicare Other | Admitting: Internal Medicine

## 2016-08-13 VITALS — BP 112/70 | HR 56 | Temp 97.8°F | Resp 14 | Ht 65.0 in | Wt 157.1 lb

## 2016-08-13 DIAGNOSIS — G250 Essential tremor: Secondary | ICD-10-CM

## 2016-08-13 DIAGNOSIS — E785 Hyperlipidemia, unspecified: Secondary | ICD-10-CM

## 2016-08-13 DIAGNOSIS — Z23 Encounter for immunization: Secondary | ICD-10-CM | POA: Diagnosis not present

## 2016-08-13 DIAGNOSIS — Z0000 Encounter for general adult medical examination without abnormal findings: Secondary | ICD-10-CM

## 2016-08-13 LAB — ALT: ALT: 12 U/L (ref 0–35)

## 2016-08-13 LAB — AST: AST: 19 U/L (ref 0–37)

## 2016-08-13 LAB — LIPID PANEL
CHOLESTEROL: 157 mg/dL (ref 0–200)
HDL: 55.8 mg/dL (ref >39.00)
LDL Cholesterol: 87 mg/dL (ref 0–99)
NonHDL: 101.19
TRIGLYCERIDES: 70 mg/dL (ref 0.0–149.0)
Total CHOL/HDL Ratio: 3
VLDL: 14 mg/dL (ref 0.0–40.0)

## 2016-08-13 LAB — TSH: TSH: 3.84 u[IU]/mL (ref 0.35–4.50)

## 2016-08-13 NOTE — Assessment & Plan Note (Signed)
Hyperlipidemia: Continue Vytorin, check a FLP, AST, ALT Depression, insomnia: Sx well-controlled, rarely takes Xanax. Continue with sertraline Familial tremor: I notice her sx  are a little worse this year, patient agrees. Recommend to see Dr. Carles Collet. Osteopenia: On calcium and vitamin D. RTC one year

## 2016-08-13 NOTE — Progress Notes (Signed)
Pre visit review using our clinic review tool, if applicable. No additional management support is needed unless otherwise documented below in the visit note. 

## 2016-08-13 NOTE — Progress Notes (Signed)
Subjective:    Patient ID: Carol Wilson, female    DOB: 06/19/1946, 70 y.o.   MRN: [id number]  DOS:  08/13/2016 Type of visit - description : CPX Interval history: We discuss her chronic medical problems and review her medications. She seems to be doing very well except for tremors, slightly more noticeable over the last year, interfering with writing and thus is causing some problems.   Review of Systems  Constitutional: No fever. No chills. No unexplained wt changes. No unusual sweats  HEENT: No dental problems, no ear discharge, no facial swelling, no voice changes. No eye discharge, no eye  redness , no  intolerance to light   Respiratory: No wheezing , no  difficulty breathing. No cough , no mucus production  Cardiovascular: No CP, no leg swelling , no  Palpitations  GI: no nausea, no vomiting, no diarrhea , no  abdominal pain.  No blood in the stools. No dysphagia, no odynophagia    Endocrine: No polyphagia, no polyuria , no polydipsia  GU: No dysuria, gross hematuria, difficulty urinating. No urinary urgency, no frequency.  Musculoskeletal: No joint swellings or unusual aches or pains  Skin: No change in the color of the skin, palor , no  Rash  Allergic, immunologic: No environmental allergies , no  food allergies  Neurological: No dizziness no  syncope. No headaches. No diplopia, no slurred, no slurred speech, no motor deficits, no facial  Numbness  Hematological: No enlarged lymph nodes, no easy bruising , no unusual bleedings  Psychiatry: No suicidal ideas, no hallucinations, no beavior problems, no confusion.  No unusual/severe anxiety, no depression  Past Medical History:  Diagnosis Date  . Depression    started ~ 2008 after she lost a daughter  . Familial tremor   . Hyperlipidemia   . Osteopenia   . Rosacea    sees derm    Past Surgical History:  Procedure Laterality Date  . APPENDECTOMY    . TUBAL LIGATION      Social History   Social  History  . Marital status: Married    Spouse name: N/A  . Number of children: 2  . Years of education: N/A   Occupational History  . 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 Smoking status: Never Smoker  . Smokeless tobacco: Never Used  . Alcohol use Yes     Comment: socially   . Drug use: No  . Sexual activity: Not on file   Other Topics Concern  . Not on file   Social History Narrative   Very active--   lost her only daughter to lung cancer ~ 2008, lost a 73 old month baby years ago , no living children, takes care of  his [name], 70 y/o     Family History  Problem Relation Age of Onset  . Lung cancer Daughter   . Coronary artery disease Mother     M 58y/o and F 68y/o  . Diabetes Neg Hx   . Hypertension Neg Hx   . Stroke Neg Hx   . Colon cancer Neg Hx   . Breast cancer Neg Hx        Medication List       Accurate as of 08/13/16  9:01 AM. Always use your most recent med list.          ALPRAZolam 0.5 MG tablet Commonly known as:  XANAX 1/2 to 1 by mouth at bedtime as  needed.   aspirin 81 MG tablet Take 81 mg by mouth daily.   azelastine 0.1 % nasal spray Commonly known as:  ASTELIN Place 2 sprays into the nose at bedtime. Use in each nostril as directed   CALCIUM PO Take 1 tablet by mouth daily.   CO Q 10 PO Take 1 tablet by mouth daily.   ezetimibe-simvastatin 10-40 MG tablet Commonly known as:  VYTORIN Take 1 tablet by mouth daily.   fluticasone 50 MCG/ACT nasal spray Commonly known as:  FLONASE Place 2 sprays into both nostrils daily.   metroNIDAZOLE 1 % gel Commonly known as:  METROGEL Apply 1 application topically daily.   propranolol 10 MG tablet Commonly known as:  INDERAL Take 2 tablets (20 mg total) by mouth daily.   sertraline 100 MG tablet Commonly known as:  ZOLOFT Take 1 tablet (100 mg total) by mouth daily.   VITAMIN D (CHOLECALCIFEROL) PO Take 1 tablet by mouth daily.            Objective:   Physical Exam BP 112/70 (BP Location: Right Arm, Patient Position: Sitting, Cuff Size: Small)   Pulse (!) 56   Temp 97.8 F (36.6 C) (Oral)   Resp 14   Ht 5\' 5"  (1.651 m)   Wt 157 lb 2 oz (71.3 kg)   SpO2 97%   BMI 26.15 kg/m   General:   Well developed, well nourished . NAD.  Neck: No  thyromegaly  HEENT:  Normocephalic . Face symmetric, atraumatic Lungs:  CTA B Normal respiratory effort, no intercostal retractions, no accessory muscle use. Heart: RRR,  no murmur.  No pretibial edema bilaterally  Abdomen:  Not distended, soft, non-tender. No rebound or rigidity.   Skin: Exposed areas without rash. Not pale. Not jaundice Neurologic:  alert & oriented X3.  Speech normal, gait appropriate for age and unassisted Strength symmetric and appropriate for age.  + Tremors: chin, hands worse on the left, voice Psych: Cognition and judgment appear intact.  Cooperative with normal attention span and concentration.  Behavior appropriate. No anxious or depressed appearing.    Assessment & Plan:   Assessment Hyperlipidemia  Depression , insomnia --onset 2008, lost a daughter Familial tremor Osteopenia dexa: 2012, 2014, 01/14/2016: T score -0.7. Rosacea   Sees dermatology Allergic rhinitis  PLAN: Hyperlipidemia: Continue Vytorin, check a FLP, AST, ALT Depression, insomnia: Sx well-controlled, rarely takes Xanax. Continue with sertraline Familial tremor: I notice her sx  are a little worse this year, patient agrees. Recommend to see Dr. Carles Collet. Osteopenia: On calcium and vitamin D. RTC one year

## 2016-08-13 NOTE — Assessment & Plan Note (Addendum)
Td 08; Pneumonia shot - 2012 ; prevnar 2015; shingles shot -- completed Flu shot today   Cervical cancer screening: Never had an abnormal PAP, married twice.PAP neg 2014, per guidelines no need for further screen, pt in agreement, to let me know if changes her mind Breast cancer screening:Breast exam 07-2015 normal , MMG (-) 02-2016 [facility]: no FH; colonoscopy 2002 (-) ;   Colonoscopy again 05/2011, next 2022 per gi  Labs: AST, ALT, FLP, TSH patient education: diet-exercisev

## 2016-08-13 NOTE — Patient Instructions (Signed)
Get your blood work before you leave      Next visit in one year    Fall Prevention and Home Safety Falls cause injuries and can affect all age groups. It is possible to use preventive measures to significantly decrease the likelihood of falls. There are many simple measures which can make your home safer and prevent falls. OUTDOORS  Repair cracks and edges of walkways and driveways.  Remove high doorway thresholds.  Trim shrubbery on the main path into your home.  Have good outside lighting.  Clear walkways of tools, rocks, debris, and clutter.  Check that handrails are not broken and are securely fastened. Both sides of steps should have handrails.  Have leaves, snow, and ice cleared regularly.  Use sand or salt on walkways during winter months.  In the garage, clean up grease or oil spills. BATHROOM  Install night lights.  Install grab bars by the toilet and in the tub and shower.  Use non-skid mats or decals in the tub or shower.  Place a plastic non-slip stool in the shower to sit on, if needed.  Keep floors dry and clean up all water on the floor immediately.  Remove soap buildup in the tub or shower on a regular basis.  Secure bath mats with non-slip, double-sided rug tape.  Remove throw rugs and tripping hazards from the floors. BEDROOMS  Install night lights.  Make sure a bedside light is easy to reach.  Do not use oversized bedding.  Keep a telephone by your bedside.  Have a firm chair with side arms to use for getting dressed.  Remove throw rugs and tripping hazards from the floor. KITCHEN  Keep handles on pots and pans turned toward the center of the stove. Use back burners when possible.  Clean up spills quickly and allow time for drying.  Avoid walking on wet floors.  Avoid hot utensils and knives.  Position shelves so they are not too high or low.  Place commonly used objects within easy reach.  If necessary, use a sturdy step  stool with a grab bar when reaching.  Keep electrical cables out of the way.  Do not use floor polish or wax that makes floors slippery. If you must use wax, use non-skid floor wax.  Remove throw rugs and tripping hazards from the floor. STAIRWAYS  Never leave objects on stairs.  Place handrails on both sides of stairways and use them. Fix any loose handrails. Make sure handrails on both sides of the stairways are as long as the stairs.  Check carpeting to make sure it is firmly attached along stairs. Make repairs to worn or loose carpet promptly.  Avoid placing throw rugs at the top or bottom of stairways, or properly secure the rug with carpet tape to prevent slippage. Get rid of throw rugs, if possible.  Have an electrician put in a light switch at the top and bottom of the stairs. OTHER FALL PREVENTION TIPS  Wear low-heel or rubber-soled shoes that are supportive and fit well. Wear closed toe shoes.  When using a stepladder, make sure it is fully opened and both spreaders are firmly locked. Do not climb a closed stepladder.  Add color or contrast paint or tape to grab bars and handrails in your home. Place contrasting color strips on first and last steps.  Learn and use mobility aids as needed. Install an electrical emergency response system.  Turn on lights to avoid dark areas. Replace light bulbs that burn   out immediately. Get light switches that glow.  Arrange furniture to create clear pathways. Keep furniture in the same place.  Firmly attach carpet with non-skid or double-sided tape.  Eliminate uneven floor surfaces.  Select a carpet pattern that does not visually hide the edge of steps.  Be aware of all pets. OTHER HOME SAFETY TIPS  Set the water temperature for 120 F (48.8 C).  Keep emergency numbers on or near the telephone.  Keep smoke detectors on every level of the home and near sleeping areas. Document Released: 10/29/2002 Document Revised: 05/09/2012  Document Reviewed: 01/28/2012 ExitCare Patient Information 2015 [employer]. This information is not intended to replace advice given to you by your health care provider. Make sure you discuss any questions you have with your health care provider.   Preventive Care for Adults Ages 65 and over  Blood pressure check.** / Every 1 to 2 years.  Lipid and cholesterol check.**/ Every 5 years beginning at age 20.  Lung cancer screening. / Every year if you are aged 55-80 years and have a 30-pack-year history of smoking and currently smoke or have quit within the past 15 years. Yearly screening is stopped once you have quit smoking for at least 15 years or develop a health problem that would prevent you from having lung cancer treatment.  Fecal occult blood test (FOBT) of stool. / Every year beginning at age 50 and continuing until age 75. You may not have to do this test if you get a colonoscopy every 10 years.  Flexible sigmoidoscopy** or colonoscopy.** / Every 5 years for a flexible sigmoidoscopy or every 10 years for a colonoscopy beginning at age 50 and continuing until age 75.  Hepatitis C blood test.** / For all people born from 1945 through 1965 and any individual with known risks for hepatitis C.  Abdominal aortic aneurysm (AAA) screening.** / A one-time screening for ages 65 to 75 years who are current or former smokers.  Skin self-exam. / Monthly.  Influenza vaccine. / Every year.  Tetanus, diphtheria, and acellular pertussis (Tdap/Td) vaccine.** / 1 dose of Td every 10 years.  Varicella vaccine.** / Consult your health care provider.  Zoster vaccine.** / 1 dose for adults aged 60 years or older.  Pneumococcal 13-valent conjugate (PCV13) vaccine.** / Consult your health care provider.  Pneumococcal polysaccharide (PPSV23) vaccine.** / 1 dose for all adults aged 65 years and older.  Meningococcal vaccine.** / Consult your health care provider.  Hepatitis A vaccine.** /  Consult your health care provider.  Hepatitis B vaccine.** / Consult your health care provider.  Haemophilus influenzae type b (Hib) vaccine.** / Consult your health care provider. **Family history and personal history of risk and conditions may change your health care provider's recommendations. Document Released: 01/04/2002 Document Revised: 11/13/2013 Document Reviewed: 04/05/2011 ExitCare Patient Information 2015 [employer]. This information is not intended to replace advice given to you by your health care provider. Make sure you discuss any questions you have with your health care provider.   

## 2016-08-22 NOTE — Progress Notes (Signed)
Carol Wilson was seen today in the movement disorders clinic for neurologic consultation at the request of Kathlene November, MD.  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  Pt reports that she has had tremor for 25 years.  It is in both hands.  She is L hand dominant.  Both hands shake equally.  Pt reports that she was started on propranolol 25 years ago for BP and it helped tremor but tremor has progressed some over the years.  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  When first started on it she had fatigue but doesn't anymore.   Tremor: Yes.     How long has it been going on? 25 years  At rest or with activation?  With activation  When is it noted the most?  With writing, put on makeup (recently affecting voice)  Fam hx of tremor?  Yes.   (father, grandma, twin sister)  Located where?  B/l hands  Affected by caffeine:  no (drinks 3 large cups of coffee per day)  Affected by alcohol:  No.  Affected by stress:  Yes.    Affected by fatigue:  No.  Spills soup if on spoon:  Yes.    Spills glass of liquid if full:  No.  Affects ADL's (tying shoes, brushing teeth, etc):  minimally  Any Other Specific Symptoms:  Sleep: sleeps well  Vivid Dreams:  No.  Acting out dreams:  No. Wet Pillows: Yes.   Postural symptoms:  No.  Falls?  No. Bradykinesia symptoms: difficulty getting out of a chair (but that is because of knees) Loss of smell:  No. Loss of taste:  No. Urinary Incontinence:  No. Difficulty Swallowing:  No. Handwriting, micrographia: No. (just more tremulous) Depression:  Yes.   ("the zoloft helps") Memory changes:  No. Hallucinations:  No.  visual distortions: No. N/V:  No. Lightheaded:  No.  Syncope: No. Diplopia:  No. Dyskinesia:  No.  Neuroimaging has previously been performed.  It was done for migraine years ago and migraine went away with  menopause   ALLERGIES:   Allergies  Allergen Reactions  . Sulfa Antibiotics Rash    RASH    CURRENT MEDICATIONS:  Outpatient Encounter Prescriptions as of 08/25/2016  Medication Sig  . ALPRAZolam (XANAX) 0.5 MG tablet 1/2 to 1 by mouth at bedtime as needed.  Marland Kitchen aspirin 81 MG tablet Take 81 mg by mouth daily.    Marland Kitchen azelastine (ASTELIN) 137 MCG/SPRAY nasal spray Place 2 sprays into the nose at bedtime. Use in each nostril as directed  . CALCIUM PO Take 1 tablet by mouth daily.  . Coenzyme Q10 (CO Q 10 PO) Take 1 tablet by mouth daily.  Marland Kitchen ezetimibe-simvastatin (VYTORIN) 10-40 MG per tablet Take 1 tablet by mouth daily.  . fluticasone (FLONASE) 50 MCG/ACT nasal spray Place 2 sprays into both nostrils daily.  . metroNIDAZOLE (METROGEL) 1 % gel Apply 1 application topically daily.  . propranolol (INDERAL) 10 MG tablet Take 2 tablets (20 mg total) by mouth daily.  . sertraline (ZOLOFT) 100 MG tablet Take 1 tablet (100 mg total) by mouth daily.  Marland Kitchen VITAMIN D, CHOLECALCIFEROL, PO Take 1 tablet by mouth daily.   No facility-administered encounter medications on file as of 08/25/2016.     PAST MEDICAL HISTORY:   Past Medical History:  Diagnosis Date  .  Depression    started ~ 2008 after she lost a daughter  . Familial tremor   . Hyperlipidemia   . Osteopenia   . Rosacea    sees derm    PAST SURGICAL HISTORY:   Past Surgical History:  Procedure Laterality Date  . APPENDECTOMY    . TUBAL LIGATION      SOCIAL HISTORY:   Social History   Social History  . Marital status: Married    Spouse name: N/A  . Number of children: 2  . Years of education: N/A   Occupational History  . retired-- cooks for the school [employer]  . Smoking status: Never Smoker  . Smokeless tobacco: Never Used  . Alcohol use Yes     Comment: once a month  . Drug use: No  . Sexual activity: Not on file   Other Topics Concern  . Not on file   Social History  Narrative   Very active--   lost her only daughter to lung cancer ~ 2008, lost a 70 old month baby years ago , no living children, takes care of  his [name], 70 y/o    FAMILY HISTORY:   Family Status  Relation Status  . Daughter   . Mother Deceased  . Father Deceased  . Sister Alive  . Brother Deceased  . Neg Hx     ROS:  A complete 10 system review of systems was obtained and was unremarkable apart from what is mentioned above.  PHYSICAL EXAMINATION:    VITALS:   Vitals:   08/25/16 0753  BP: 110/64  Pulse: 70  Weight: 159 lb (72.1 kg)  Height: 5\' 6"  (1.676 m)    GEN:  The patient appears stated age and is in NAD. HEENT:  Normocephalic, atraumatic.  The mucous membranes are moist. The superficial temporal arteries are without ropiness or tenderness. CV:  RRR Lungs:  CTAB Neck/HEME:  There are no carotid bruits bilaterally.  Neurological examination:  Orientation: The patient is alert and oriented x3. Fund of knowledge is appropriate.  Recent and remote memory are intact.  Attention and concentration are normal.    Able to name objects and repeat phrases. Cranial nerves: There is good facial symmetry. Pupils are equal round and reactive to light bilaterally. Fundoscopic exam reveals clear margins bilaterally. Extraocular muscles are intact. The visual fields are full to confrontational testing. The speech is fluent and clear. Soft palate rises symmetrically and there is no tongue deviation. Hearing is intact to conversational tone. Sensation: Sensation is intact to light and pinprick throughout (facial, trunk, extremities). Vibration is decreased at the bilateral big toe. There is no extinction with double simultaneous stimulation. There is no sensory dermatomal level identified. Motor: Strength is 5/5 in the bilateral upper and lower extremities.   Shoulder shrug is equal and symmetric.  There is no pronator drift. Deep tendon reflexes: Deep tendon reflexes are 3+/4 at the  bilateral biceps, triceps, brachioradialis, patella and achilles. Plantar responses are downgoing bilaterally.  She has cross adductor reflexes bilaterally.  There are pectoralis reflexes.She has bilateral Hoffmann's and Tromner's reflexes.  The reflexes in the arm, particularly along the biceps get "hung up."  Movement examination: Tone: There is normal tone in the bilateral upper extremities.  The tone in the lower extremities is normal.  Abnormal movements: she has difficulty with archimedes spirals.  she has difficulty when asked to pour a full glass of water from one glass to another  and spills it with water in either hand.  There is head tremor (somewhat complex but mostly in the "yes" direction).  There is jaw tremor.  She has tremor of the outstretched hands, left greater than right.  It only increases slightly with intention.  There is no rest tremor. Coordination:  There is no decremation with RAM's, with any form of RAMS, including alternating supination and pronation of the forearm, hand opening and closing, finger taps, heel taps and toe taps. Gait and Station: The patient has no difficulty arising out of a deep-seated chair without the use of the hands. The patient's stride length is normal.  She does have difficulty walking in a tandem fashion.     Lab Results  Component Value Date   TSH 3.84 08/13/2016     Chemistry      Component Value Date/Time   NA 140 08/13/2015 0938   K 4.3 08/13/2015 0938   CL 105 08/13/2015 0938   CO2 32 08/13/2015 0938   BUN 14 08/13/2015 0938   CREATININE 0.82 08/13/2015 0938      Component Value Date/Time   CALCIUM 10.1 08/13/2015 0938   ALKPHOS 67 08/09/2014 0836   AST 19 08/13/2016 0923   ALT 12 08/13/2016 0923   BILITOT 0.5 08/09/2014 0836     No results found for: VITAMINB12   ASSESSMENT/PLAN:  1.  Essential Tremor.  -This is evidenced by the symmetrical nature and longstanding hx of gradually getting worse.  We discussed nature and  pathophysiology.  We discussed that this can continue to gradually get worse with time.  We discussed that some medications can worsen this, as can caffeine use.  We discussed medication therapy as well as surgical therapy.  She is already on propranolol, but she is only on 10 mg daily.  She is somewhat nervous about going up on this (as am I) because of her low blood pressure  Ultimately, the patient decided to add primidone, 50 mg nightly.  Risks, benefits, side effects and alternative therapies were discussed.  The opportunity to ask questions was given and they were answered to the best of my ability.  The patient expressed understanding and willingness to follow the outlined treatment protocols.  2.  Hyperflexia  -talked to her about the fact that this could be physiologic, but she is quite hyperreflexic.  Talked to her about the fact that this could mean a central, brain lesion or also a cervical spine lesion.  She denies any neck pain.  She wants to hold off on any neuroimaging for now.  Her neurological exam was nonfocal and nonlateralizing.  If she developed falls, difficulty walking, or any focal or lateralizing symptoms, our threshold for imaging should be quite low.  3.  Follow up is anticipated in the next few months, sooner should new neurologic issues arise.  Much greater than 50% of this visit was spent in counseling and coordinating care.  Total face to face time:  45 min    Cc:  Kathlene November, MD

## 2016-08-25 ENCOUNTER — Ambulatory Visit (INDEPENDENT_AMBULATORY_CARE_PROVIDER_SITE_OTHER): Payer: Medicare Other | Admitting: Neurology

## 2016-08-25 ENCOUNTER — Encounter: Payer: Self-pay | Admitting: Neurology

## 2016-08-25 VITALS — BP 110/64 | HR 70 | Ht 66.0 in | Wt 159.0 lb

## 2016-08-25 DIAGNOSIS — R292 Abnormal reflex: Secondary | ICD-10-CM

## 2016-08-25 DIAGNOSIS — G250 Essential tremor: Secondary | ICD-10-CM | POA: Diagnosis not present

## 2016-08-25 MED ORDER — PRIMIDONE 50 MG PO TABS: 50.0000 mg | ORAL_TABLET | Freq: Every day | ORAL | 2 refills | Status: DC

## 2016-08-25 NOTE — Patient Instructions (Signed)
1. Start Primidone 50 mg tablets. Take 1/2 tablet for 4 nights, then increase to 1 tablet. Prescription has been sent to your pharmacy. The first dose of medication can cause some dizziness/nausea that should go away after the first dose.   

## 2016-11-22 ENCOUNTER — Other Ambulatory Visit: Payer: Self-pay | Admitting: Neurology

## 2016-11-22 HISTORY — PX: OTHER SURGICAL HISTORY: SHX169

## 2016-11-23 NOTE — Progress Notes (Signed)
Carol Wilson was seen today in the movement disorders clinic for neurologic consultation at the request of Kathlene November, MD.  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  Pt reports that she has had tremor for 25 years.  It is in both hands.  She is L hand dominant.  Both hands shake equally.  Pt reports that she was started on propranolol 25 years ago for BP and it helped tremor but tremor has progressed some over the years.  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  When first started on it she had fatigue but doesn't anymore.  11/25/16 update:  Patient was seen today back in follow-up for essential tremor.  Last visit, we started her on primidone.  She also takes propranolol 10 mg daily.  She reports that the new primidone has "helped so much and I feel great."  States that she is only taking 25 mg because she did so great and never needed to go up to 50 mg daily.    ALLERGIES:   Allergies  Allergen Reactions  . Sulfa Antibiotics Rash    RASH    CURRENT MEDICATIONS:  Outpatient Encounter Prescriptions as of 11/25/2016  Medication Sig  . ALPRAZolam (XANAX) 0.5 MG tablet 1/2 to 1 by mouth at bedtime as needed.  Marland Kitchen aspirin 81 MG tablet Take 81 mg by mouth daily.    Marland Kitchen azelastine (ASTELIN) 137 MCG/SPRAY nasal spray Place 2 sprays into the nose at bedtime. Use in each nostril as directed  . CALCIUM PO Take 1 tablet by mouth daily.  Marland Kitchen ezetimibe-simvastatin (VYTORIN) 10-40 MG per tablet Take 1 tablet by mouth daily.  . fluticasone (FLONASE) 50 MCG/ACT nasal spray Place 2 sprays into both nostrils daily.  . metroNIDAZOLE (METROGEL) 1 % gel Apply 1 application topically daily.  . primidone (MYSOLINE) 50 MG tablet take 1 tablet by mouth once daily at bedtime  . propranolol (INDERAL) 10 MG tablet Take 2 tablets (20 mg total) by mouth daily.  . sertraline (ZOLOFT) 100 MG tablet Take 1  tablet (100 mg total) by mouth daily.  Marland Kitchen VITAMIN D, CHOLECALCIFEROL, PO Take 1 tablet by mouth daily.  . [DISCONTINUED] Coenzyme Q10 (CO Q 10 PO) Take 1 tablet by mouth daily.   No facility-administered encounter medications on file as of 11/25/2016.     PAST MEDICAL HISTORY:   Past Medical History:  Diagnosis Date  . Depression    started ~ 2008 after she lost a daughter  . Familial tremor   . HTN (hypertension)    well controlled with propranolol  . Hyperlipidemia   . Osteopenia   . Rosacea    sees derm    PAST SURGICAL HISTORY:   Past Surgical History:  Procedure Laterality Date  . APPENDECTOMY    . TUBAL LIGATION      SOCIAL HISTORY:   Social History   Social History  . Marital status: Married    Spouse name: N/A  . Number of children: 2  . Years of education: N/A   Occupational History  . 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 Smoking status: Never Smoker  . Smokeless tobacco: Never Used  . Alcohol use Yes     Comment: once a month  . Drug use: No  . Sexual activity: Not on file  Other Topics Concern  . Not on file   Social History Narrative   Very active--   lost her only daughter to lung cancer ~ 2008, lost a 95 old month baby years ago , no living children, takes care of  his [name], 71 y/o    FAMILY HISTORY:   Family Status  Relation Status  . Daughter Deceased  . Mother Deceased  . Father Deceased  . Sister Alive  . Brother Deceased  . Daughter Deceased  . Neg Hx     ROS:  A complete 10 system review of systems was obtained and was unremarkable apart from what is mentioned above.  PHYSICAL EXAMINATION:    VITALS:   Vitals:   11/25/16 0803  BP: 110/80  Pulse: 62  SpO2: 97%  Weight: 160 lb (72.6 kg)  Height: 5\' 6"  (1.676 m)    GEN:  The patient appears stated age and is in NAD. HEENT:  Normocephalic, atraumatic.  The mucous membranes are moist. The superficial temporal arteries are  without ropiness or tenderness. CV:  RRR Lungs:  CTAB Neck/HEME:  There are no carotid bruits bilaterally.  Neurological examination:  Orientation: The patient is alert and oriented x3.  Cranial nerves: There is good facial symmetry. The speech is fluent and clear. Soft palate rises symmetrically and there is no tongue deviation. Hearing is intact to conversational tone. Sensation: Sensation is intact to light touch throughout Motor: Strength is 5/5 in the bilateral upper and lower extremities.   Shoulder shrug is equal and symmetric.  There is no pronator drift.  Movement examination: Tone: There is normal tone in the bilateral upper extremities.  The tone in the lower extremities is normal.  Abnormal movements: archimedes spirals are greatly improved c/t last visit.  No tremor of outstretched hands.  There is minor head tremor Coordination:  There is no decremation with RAM's, with any form of RAMS, including alternating supination and pronation of the forearm, hand opening and closing, finger taps, heel taps and toe taps. Gait and Station: The patient has no trouble ambulating   Lab Results  Component Value Date   TSH 3.84 08/13/2016     Chemistry      Component Value Date/Time   NA 140 08/13/2015 0938   K 4.3 08/13/2015 0938   CL 105 08/13/2015 0938   CO2 32 08/13/2015 0938   BUN 14 08/13/2015 0938   CREATININE 0.82 08/13/2015 0938      Component Value Date/Time   CALCIUM 10.1 08/13/2015 0938   ALKPHOS 67 08/09/2014 0836   AST 19 08/13/2016 0923   ALT 12 08/13/2016 0923   BILITOT 0.5 08/09/2014 0836     No results found for: [id number]   ASSESSMENT/PLAN:  1.  Essential Tremor.  -pt is much improved on primidone and is only on 25 mg daily and will continue on that.  -on propranolol 10 mg daily.  2.  Hyperflexia  -talked to her about the fact that this could be physiologic, but she is quite hyperreflexic.  Talked to her about the fact that this could mean a  central, brain lesion or also a cervical spine lesion.  She denies any neck pain.  She wants to hold off on any neuroimaging for now.  Her neurological exam was nonfocal and nonlateralizing.  If she developed falls, difficulty walking, or any focal or lateralizing symptoms, our threshold for imaging should be quite low.  3.  Follow up is anticipated in the next 6-8 months sooner  should new issues arise.      Cc:  Kathlene November, MD

## 2016-11-25 ENCOUNTER — Encounter: Payer: Self-pay | Admitting: Neurology

## 2016-11-25 ENCOUNTER — Ambulatory Visit (INDEPENDENT_AMBULATORY_CARE_PROVIDER_SITE_OTHER): Payer: Medicare Other | Admitting: Neurology

## 2016-11-25 VITALS — BP 110/80 | HR 62 | Ht 66.0 in | Wt 160.0 lb

## 2016-11-25 DIAGNOSIS — G250 Essential tremor: Secondary | ICD-10-CM

## 2017-01-05 ENCOUNTER — Telehealth: Payer: Self-pay | Admitting: *Deleted

## 2017-01-05 NOTE — Telephone Encounter (Signed)
Scheduled AWV for Feb 19 @10 .

## 2017-01-07 NOTE — Progress Notes (Signed)
Subjective:   Carol Wilson is a 71 y.o. female who presents for Medicare Annual (Subsequent) preventive examination.  Pt states she has been told that her iron level is too low the last two times she has tried to give blood. Review of Systems:  No ROS.  Medicare Wellness Visit.  Cardiac Risk Factors include: advanced age (>27men, >76 women);dyslipidemia Sleep patterns: Wakes to urinate once per night. Sleeps 8-10 hrs per night. Feels rested.  Home Safety/Smoke Alarms:  Feels safe in home. Smoke, carbon monoxide detectors, and security alarms in place.  Living environment; residence and Firearm Safety: Lives at home with husband and grandson. Guns safely stored. Seat Belt Safety/Bike Helmet: Wears seat belt.   Counseling:   Eye Exam- Wears glasses. Dr.Bernstrof annually.  Dental- Dr.Kwon every 6 months.  Female:   Pap- Last 12/29/12: normal. Pt states she is no longer getting screened.  Mammo- Last   02/23/16: BI-RADS CATEGORY 1: Negative.  Dexa scan-  Last 01/18/16: Osteopenia      CCS- Last 02/09/01: diverticulosis. Recall 10 yrs per report. Will place referral today per pt request.      Objective:     Vitals: BP 126/77 (BP Location: Left Arm, Patient Position: Sitting, Cuff Size: Normal)   Pulse 62   Ht 5\' 6"  (1.676 m)   Wt 163 lb (73.9 kg)   SpO2 100%   BMI 26.31 kg/m   Body mass index is 26.31 kg/m.   Tobacco History  Smoking Status  . Never Smoker  Smokeless Tobacco  . Never Used     Counseling given: Not Answered   Past Medical History:  Diagnosis Date  . Depression    started ~ 2008 after she lost a daughter  . Familial tremor   . HTN (hypertension)    well controlled with propranolol  . Hyperlipidemia   . Osteopenia   . Rosacea    sees derm   Past Surgical History:  Procedure Laterality Date  . APPENDECTOMY    . TUBAL LIGATION     Family History  Problem Relation Age of Onset  . Lung cancer Daughter   . Coronary artery disease Mother    M 58y/o and F 68y/o  . Heart attack Father   . Alcohol abuse Brother   . Tuberculosis Daughter   . Diabetes Neg Hx   . Hypertension Neg Hx   . Stroke Neg Hx   . Colon cancer Neg Hx   . Breast cancer Neg Hx    History  Sexual Activity  . Sexual activity: Not Currently    Outpatient Encounter Prescriptions as of 01/10/2017  Medication Sig  . ALPRAZolam (XANAX) 0.5 MG tablet 1/2 to 1 by mouth at bedtime as needed.  Marland Kitchen aspirin 81 MG tablet Take 81 mg by mouth daily.    Marland Kitchen azelastine (ASTELIN) 137 MCG/SPRAY nasal spray Place 2 sprays into the nose at bedtime. Use in each nostril as directed  . CALCIUM PO Take 1 tablet by mouth daily.  Marland Kitchen ezetimibe-simvastatin (VYTORIN) 10-40 MG per tablet Take 1 tablet by mouth daily.  . metroNIDAZOLE (METROGEL) 1 % gel Apply 1 application topically daily.  . primidone (MYSOLINE) 50 MG tablet take 1 tablet by mouth once daily at bedtime  . propranolol (INDERAL) 10 MG tablet Take 2 tablets (20 mg total) by mouth daily. (Patient taking differently: Take 10 mg by mouth daily. )  . sertraline (ZOLOFT) 100 MG tablet Take 1 tablet (100 mg total) by mouth daily.  Marland Kitchen  VITAMIN D, CHOLECALCIFEROL, PO Take 1 tablet by mouth daily.  . fluticasone (FLONASE) 50 MCG/ACT nasal spray Place 2 sprays into both nostrils daily. (Patient not taking: Reported on 01/10/2017)   No facility-administered encounter medications on file as of 01/10/2017.     Activities of Daily Living In your present state of health, do you have any difficulty performing the following activities: 01/10/2017 08/13/2016  Hearing? N N  Vision? N N  Difficulty concentrating or making decisions? N N  Walking or climbing stairs? N N  Dressing or bathing? N N  Doing errands, shopping? N N  Preparing Food and eating ? N -  Using the Toilet? N -  In the past six months, have you accidently leaked urine? N -  Do you have problems with loss of bowel control? N -  Managing your Medications? N -  Managing your  Finances? N -  Housekeeping or managing your Housekeeping? N -  Some recent data might be hidden    Patient Care Team: Ludwig Clarks, DO as PCP - General (Neurology)    Assessment:    Physical assessment deferred to PCP.  Exercise Activities and Dietary recommendations Current Exercise Habits: Structured exercise class, Type of exercise: treadmill;strength training/weights, Time (Minutes): 50, Frequency (Times/Week): 3, Weekly Exercise (Minutes/Week): 150, Intensity: Moderate, Exercise limited by: None identified   Diet (meal preparation, eat out, water intake, caffeinated beverages, dairy products, fruits and vegetables): in general, a "healthy" diet  , on average, 3 meals per day  Drinks 8-10 bottles of water per day.   Goals    . lose 10-15 lbs with diet and exercise.      Fall Risk Fall Risk  01/10/2017 11/25/2016 08/25/2016 08/13/2016 08/13/2015  Falls in the past year? No No No No No   Depression Screen PHQ 2/9 Scores 01/10/2017 08/13/2016 08/13/2015 08/06/2014  PHQ - 2 Score 0 0 0 0     Cognitive Function MMSE - Mini Mental State Exam 01/10/2017  Orientation to time 5  Orientation to Place 5  Registration 3  Attention/ Calculation 5  Recall 1  Language- name 2 objects 2  Language- repeat 1  Language- follow 3 step command 3  Language- read & follow direction 1  Write a sentence 1  Copy design 1  Total score 28        Immunization History  Administered Date(s) Administered  . Influenza Whole 08/22/2009  . Influenza, High Dose Seasonal PF 08/16/2013, 08/13/2016  . Influenza,inj,Quad PF,36+ Mos 08/06/2014, 08/13/2015  . Pneumococcal Conjugate-13 08/06/2014  . Pneumococcal Polysaccharide-23 06/08/2011  . Td 02/22/2007  . Zoster 12/15/2009   Screening Tests Health Maintenance  Topic Date Due  . TETANUS/TDAP  02/21/2017  . MAMMOGRAM  02/22/2017  . COLONOSCOPY  06/21/2021  . INFLUENZA VACCINE  Completed  . DEXA SCAN  Completed  . ZOSTAVAX  Completed  .  Hepatitis C Screening  Completed  . PNA vac Low Risk Adult  Completed      Plan:     Low iron concerns discussed with Dr.Paz- go to lab for blood work today.   Follow up with Dr.Paz as scheduled 08/17/17.   Schedule colonoscopy-referral placed today.  Continue to eat heart healthy diet (full of fruits, vegetables, whole grains, lean protein, water--limit salt, fat, and sugar intake) and increase physical activity as tolerated.  Continue doing brain stimulating activities (puzzles, reading, adult coloring books, staying active) to keep memory sharp.   Bring a copy of your advance directives to  your next office visit.  During the course of the visit the patient was educated and counseled about the following appropriate screening and preventive services:   Vaccines to include Pneumoccal, Influenza, Hepatitis B, Td, Zostavax, HCV-UTD  Cardiovascular Disease  Colorectal cancer screening-REFERRAL PLACED TODAY  Bone density screening-UTD  Diabetes screening  Glaucoma screening  Mammography/PAP-UTD  Nutrition counseling   Patient Instructions (the written plan) was given to the patient.   Naaman Plummer [place], [place]  01/10/2017

## 2017-01-07 NOTE — Progress Notes (Signed)
Pre visit review using our clinic review tool, if applicable. No additional management support is needed unless otherwise documented below in the visit note. 

## 2017-01-10 ENCOUNTER — Ambulatory Visit (INDEPENDENT_AMBULATORY_CARE_PROVIDER_SITE_OTHER): Payer: Medicare Other | Admitting: *Deleted

## 2017-01-10 ENCOUNTER — Encounter: Payer: Self-pay | Admitting: *Deleted

## 2017-01-10 ENCOUNTER — Other Ambulatory Visit (INDEPENDENT_AMBULATORY_CARE_PROVIDER_SITE_OTHER): Payer: Medicare Other

## 2017-01-10 VITALS — BP 126/77 | HR 62 | Ht 66.0 in | Wt 163.0 lb

## 2017-01-10 DIAGNOSIS — Z0000 Encounter for general adult medical examination without abnormal findings: Secondary | ICD-10-CM

## 2017-01-10 DIAGNOSIS — Z1211 Encounter for screening for malignant neoplasm of colon: Secondary | ICD-10-CM

## 2017-01-10 DIAGNOSIS — D619 Aplastic anemia, unspecified: Secondary | ICD-10-CM

## 2017-01-10 DIAGNOSIS — D649 Anemia, unspecified: Secondary | ICD-10-CM

## 2017-01-10 LAB — CBC WITH DIFFERENTIAL/PLATELET
BASOS ABS: 0 10*3/uL (ref 0.0–0.1)
Basophils Relative: 1.1 % (ref 0.0–3.0)
Eosinophils Absolute: 0.1 10*3/uL (ref 0.0–0.7)
Eosinophils Relative: 2.7 % (ref 0.0–5.0)
HEMATOCRIT: 36.1 % (ref 36.0–46.0)
HEMOGLOBIN: 11.6 g/dL — AB (ref 12.0–15.0)
LYMPHS ABS: 1.5 10*3/uL (ref 0.7–4.0)
LYMPHS PCT: 32.8 % (ref 12.0–46.0)
MCHC: 32 g/dL (ref 30.0–36.0)
MCV: 77.2 fl — ABNORMAL LOW (ref 78.0–100.0)
MONOS PCT: 9.7 % (ref 3.0–12.0)
Monocytes Absolute: 0.5 10*3/uL (ref 0.1–1.0)
NEUTROS PCT: 53.7 % (ref 43.0–77.0)
Neutro Abs: 2.5 10*3/uL (ref 1.4–7.7)
Platelets: 280 10*3/uL (ref 150.0–400.0)
RBC: 4.68 Mil/uL (ref 3.87–5.11)
RDW: 18.1 % — ABNORMAL HIGH (ref 11.5–15.5)
WBC: 4.7 10*3/uL (ref 4.0–10.5)

## 2017-01-10 LAB — IRON: Iron: 21 ug/dL — ABNORMAL LOW (ref 42–145)

## 2017-01-10 LAB — FERRITIN: Ferritin: 6.9 ng/mL — ABNORMAL LOW (ref 10.0–291.0)

## 2017-01-10 NOTE — Patient Instructions (Signed)
Go to the Lab.  Follow up with Dr.Paz as scheduled 08/17/17.   Schedule colonoscopy-referral placed today.  Continue to eat heart healthy diet (full of fruits, vegetables, whole grains, lean protein, water--limit salt, fat, and sugar intake) and increase physical activity as tolerated.  Continue doing brain stimulating activities (puzzles, reading, adult coloring books, staying active) to keep memory sharp.   Bring a copy of your advance directives to your next office visit.

## 2017-01-12 ENCOUNTER — Encounter: Payer: Self-pay | Admitting: Internal Medicine

## 2017-01-13 MED ORDER — FERROUS SULFATE 325 (65 FE) MG PO TABS: 325.0000 mg | ORAL_TABLET | Freq: Two times a day (BID) | ORAL | 5 refills | Status: DC

## 2017-01-13 NOTE — Addendum Note (Signed)
Addended byDamita Dunnings D on: 01/13/2017 03:39 PM   Modules accepted: Orders

## 2017-01-19 ENCOUNTER — Other Ambulatory Visit: Payer: Self-pay | Admitting: Internal Medicine

## 2017-01-19 ENCOUNTER — Other Ambulatory Visit: Payer: Self-pay | Admitting: Neurology

## 2017-01-19 MED ORDER — EZETIMIBE-SIMVASTATIN 10-40 MG PO TABS: 1.0000 | ORAL_TABLET | Freq: Every day | ORAL | 2 refills | Status: DC

## 2017-01-19 NOTE — Telephone Encounter (Signed)
Rx sent to [facility]

## 2017-01-19 NOTE — Telephone Encounter (Signed)
Patient has changed insurance and now uses  [place], [place] [phone] (Phone) [phone] (Fax)   Please send Rx to this pharmacy

## 2017-07-26 ENCOUNTER — Ambulatory Visit: Payer: Medicare Other | Admitting: Neurology

## 2017-08-04 NOTE — Progress Notes (Signed)
Carol Wilson was seen today in the movement disorders clinic for neurologic consultation at the request of Colon Branch, MD.  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  Pt reports that she has had tremor for 25 years.  It is in both hands.  She is L hand dominant.  Both hands shake equally.  Pt reports that she was started on propranolol 25 years ago for BP and it helped tremor but tremor has progressed some over the years.  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  When first started on it she had fatigue but doesn't anymore.  11/25/16 update:  Patient was seen today back in follow-up for essential tremor.  Last visit, we started her on primidone.  She also takes propranolol 10 mg daily.  She reports that the new primidone has "helped so much and I feel great."  States that she is only taking 25 mg because she did so great and never needed to go up to 50 mg daily.    08/09/17 update:  Patient seen in follow-up for essential tremor.  She is on low-dose primidone, 25 mg at night.  She is also on propranolol, 10 mg daily.   "I'm doing unbelievably well."   The records that were made available to me were reviewed.  Due to see PCP next week.  She got cataracts removed and implants and now doesn't have to wear glasses and is very happy.    ALLERGIES:   Allergies  Allergen Reactions  . Sulfa Antibiotics Rash    RASH    CURRENT MEDICATIONS:  Outpatient Encounter Prescriptions as of 08/09/2017  Medication Sig  . ALPRAZolam (XANAX) 0.5 MG tablet 1/2 to 1 by mouth at bedtime as needed.  Marland Kitchen aspirin 81 MG tablet Take 81 mg by mouth daily.    Marland Kitchen CALCIUM PO Take 1 tablet by mouth daily.  Marland Kitchen ezetimibe-simvastatin (VYTORIN) 10-40 MG tablet Take 1 tablet by mouth daily.  . ferrous sulfate 325 (65 FE) MG tablet Take 1 tablet (325 mg total) by mouth 2 (two) times daily with a meal.  .  fluticasone (FLONASE) 50 MCG/ACT nasal spray Place 2 sprays into both nostrils daily.  . metroNIDAZOLE (METROGEL) 1 % gel Apply 1 application topically daily.  . primidone (MYSOLINE) 50 MG tablet Take 0.5 tablets (25 mg total) by mouth at bedtime.  . propranolol (INDERAL) 10 MG tablet Take 2 tablets (20 mg total) by mouth daily. (Patient taking differently: Take 10 mg by mouth daily. )  . sertraline (ZOLOFT) 100 MG tablet Take 1 tablet (100 mg total) by mouth daily.  Marland Kitchen VITAMIN D, CHOLECALCIFEROL, PO Take 1 tablet by mouth daily.  . [DISCONTINUED] primidone (MYSOLINE) 50 MG tablet Take 0.5 tablets (25 mg total) by mouth at bedtime.  . [DISCONTINUED] azelastine (ASTELIN) 137 MCG/SPRAY nasal spray Place 2 sprays into the nose at bedtime. Use in each nostril as directed  . [DISCONTINUED] primidone (MYSOLINE) 50 MG tablet take 1 tablet by mouth once daily at bedtime   No facility-administered encounter medications on file as of 08/09/2017.     PAST MEDICAL HISTORY:   Past Medical History:  Diagnosis Date  . Depression    started ~ 2008 after she lost a daughter  . Familial tremor   . HTN (hypertension)    well controlled with propranolol  . Hyperlipidemia   .  Osteopenia   . Rosacea    sees derm    PAST SURGICAL HISTORY:   Past Surgical History:  Procedure Laterality Date  . APPENDECTOMY    . TUBAL LIGATION      SOCIAL HISTORY:   Social History   Social History  . Marital status: Married    Spouse name: N/A  . Number of children: 2  . Years of education: N/A   Occupational History  . retired-- cooks for the school [employer]  . Smoking status: Never Smoker  . Smokeless tobacco: Never Used  . Alcohol use Yes     Comment: once a month  . Drug use: No  . Sexual activity: Not Currently   Other Topics Concern  . Not on file   Social History Narrative   Very active--   lost her only daughter to lung cancer ~ 2008, lost a 19 old month  baby years ago , no living children, takes care of  his [name], 71 y/o    FAMILY HISTORY:   Family Status  Relation Status  . Daughter Deceased  . Mother Deceased  . Father Deceased  . Sister Alive  . Brother Deceased  . Daughter Deceased  . Neg Hx (Not Specified)    ROS:  A complete 10 system review of systems was obtained and was unremarkable apart from what is mentioned above.  PHYSICAL EXAMINATION:    VITALS:   Vitals:   08/09/17 1319  BP: 100/64  Pulse: 60  SpO2: 95%  Weight: 158 lb (71.7 kg)  Height: 5\' 6"  (1.676 m)    GEN:  The patient appears stated age and is in NAD. HEENT:  Normocephalic, atraumatic.  The mucous membranes are moist. The superficial temporal arteries are without ropiness or tenderness. CV:  RRR Lungs:  CTAB Neck/HEME:  There are no carotid bruits bilaterally.  Neurological examination:  Orientation: The patient is alert and oriented x3.  Cranial nerves: There is good facial symmetry. The speech is fluent and clear. Soft palate rises symmetrically and there is no tongue deviation. Hearing is intact to conversational tone. Sensation: Sensation is intact to light touch throughout Motor: Strength is 5/5 in the bilateral upper and lower extremities.   Shoulder shrug is equal and symmetric.  There is no pronator drift.  Movement examination: Tone: There is normal tone in the bilateral upper extremities.  The tone in the lower extremities is normal.  Abnormal movements: no head tremor. Very minimal hand tremor with intention but not with outstretched hands. Coordination:  There is no decremation with RAM's, with any form of RAMS, including alternating supination and pronation of the forearm, hand opening and closing, finger taps, heel taps and toe taps. Gait and Station: The patient has no trouble ambulating   Lab Results  Component Value Date   TSH 3.84 08/13/2016     Chemistry      Component Value Date/Time   NA 140 08/13/2015 0938   K 4.3  08/13/2015 0938   CL 105 08/13/2015 0938   CO2 32 08/13/2015 0938   BUN 14 08/13/2015 0938   CREATININE 0.82 08/13/2015 0938      Component Value Date/Time   CALCIUM 10.1 08/13/2015 0938   ALKPHOS 67 08/09/2014 0836   AST 19 08/13/2016 0923   ALT 12 08/13/2016 0923   BILITOT 0.5 08/09/2014 0836     No results found for: [id number]   ASSESSMENT/PLAN:  1.  Essential Tremor.  -pt  is much improved on primidone and is only on 25 mg daily and will continue on that.  -on propranolol 10 mg daily.  2.  Hyperflexia  -talked to her about the fact that this could be physiologic, but she is quite hyperreflexic.  Talked to her about the fact that this could mean a central, brain lesion or also a cervical spine lesion.  She denies any neck pain.  She wants to hold off on any neuroimaging for now.  Her neurological exam was nonfocal and nonlateralizing.  If she developed falls, difficulty walking, or any focal or lateralizing symptoms, our threshold for imaging should be quite low.  3.  Follow up is anticipated in the next 6-8 months sooner should new issues arise.      Cc:  Colon Branch, MD

## 2017-08-08 ENCOUNTER — Other Ambulatory Visit: Payer: Self-pay | Admitting: Internal Medicine

## 2017-08-08 DIAGNOSIS — Z1231 Encounter for screening mammogram for malignant neoplasm of breast: Secondary | ICD-10-CM

## 2017-08-09 ENCOUNTER — Encounter: Payer: Self-pay | Admitting: Neurology

## 2017-08-09 ENCOUNTER — Ambulatory Visit (INDEPENDENT_AMBULATORY_CARE_PROVIDER_SITE_OTHER): Payer: Medicare Other | Admitting: Neurology

## 2017-08-09 VITALS — BP 100/64 | HR 60 | Ht 66.0 in | Wt 158.0 lb

## 2017-08-09 DIAGNOSIS — G250 Essential tremor: Secondary | ICD-10-CM

## 2017-08-09 MED ORDER — PRIMIDONE 50 MG PO TABS: 25.0000 mg | ORAL_TABLET | Freq: Every day | ORAL | 1 refills | Status: DC

## 2017-08-11 ENCOUNTER — Ambulatory Visit (HOSPITAL_BASED_OUTPATIENT_CLINIC_OR_DEPARTMENT_OTHER)
Admission: RE | Admit: 2017-08-11 | Discharge: 2017-08-11 | Disposition: A | Discharge status: Home / Self Care | Payer: Medicare Other | Source: Ambulatory Visit | Attending: Internal Medicine | Admitting: Internal Medicine

## 2017-08-11 DIAGNOSIS — Z1231 Encounter for screening mammogram for malignant neoplasm of breast: Secondary | ICD-10-CM | POA: Insufficient documentation

## 2017-08-17 ENCOUNTER — Encounter: Payer: Medicare Other | Admitting: Internal Medicine

## 2017-08-18 ENCOUNTER — Encounter: Payer: Self-pay | Admitting: Internal Medicine

## 2017-08-18 ENCOUNTER — Ambulatory Visit (INDEPENDENT_AMBULATORY_CARE_PROVIDER_SITE_OTHER): Payer: Medicare Other | Admitting: Internal Medicine

## 2017-08-18 VITALS — BP 116/74 | HR 55 | Temp 98.0°F | Resp 14 | Ht 66.0 in | Wt 158.4 lb

## 2017-08-18 DIAGNOSIS — Z23 Encounter for immunization: Secondary | ICD-10-CM

## 2017-08-18 DIAGNOSIS — Z0000 Encounter for general adult medical examination without abnormal findings: Secondary | ICD-10-CM

## 2017-08-18 LAB — CBC WITH DIFFERENTIAL/PLATELET
BASOS ABS: 0 10*3/uL (ref 0.0–0.1)
Basophils Relative: 1 % (ref 0.0–3.0)
EOS PCT: 2.1 % (ref 0.0–5.0)
Eosinophils Absolute: 0.1 10*3/uL (ref 0.0–0.7)
HCT: 42.8 % (ref 36.0–46.0)
HEMOGLOBIN: 14.1 g/dL (ref 12.0–15.0)
Lymphocytes Relative: 33.6 % (ref 12.0–46.0)
Lymphs Abs: 1.5 10*3/uL (ref 0.7–4.0)
MCHC: 33 g/dL (ref 30.0–36.0)
MCV: 91.4 fl (ref 78.0–100.0)
MONO ABS: 0.3 10*3/uL (ref 0.1–1.0)
MONOS PCT: 7.7 % (ref 3.0–12.0)
Neutro Abs: 2.5 10*3/uL (ref 1.4–7.7)
Neutrophils Relative %: 55.6 % (ref 43.0–77.0)
Platelets: 249 10*3/uL (ref 150.0–400.0)
RBC: 4.68 Mil/uL (ref 3.87–5.11)
RDW: 12.8 % (ref 11.5–15.5)
WBC: 4.5 10*3/uL (ref 4.0–10.5)

## 2017-08-18 LAB — COMPREHENSIVE METABOLIC PANEL
ALBUMIN: 4.5 g/dL (ref 3.5–5.2)
ALK PHOS: 67 U/L (ref 39–117)
ALT: 19 U/L (ref 0–35)
AST: 21 U/L (ref 0–37)
BILIRUBIN TOTAL: 0.6 mg/dL (ref 0.2–1.2)
BUN: 15 mg/dL (ref 6–23)
CALCIUM: 10.4 mg/dL (ref 8.4–10.5)
CHLORIDE: 104 meq/L (ref 96–112)
CO2: 31 mEq/L (ref 19–32)
CREATININE: 0.87 mg/dL (ref 0.40–1.20)
GFR: 68.14 mL/min (ref >60.00)
Glucose, Bld: 98 mg/dL (ref 70–99)
Potassium: 4.5 mEq/L (ref 3.5–5.1)
Sodium: 137 mEq/L (ref 135–145)
Total Protein: 6.9 g/dL (ref 6.0–8.3)

## 2017-08-18 LAB — LIPID PANEL
CHOL/HDL RATIO: 3
CHOLESTEROL: 175 mg/dL (ref 0–200)
HDL: 56.9 mg/dL (ref >39.00)
LDL Cholesterol: 103 mg/dL — ABNORMAL HIGH (ref 0–99)
NONHDL: 117.89
Triglycerides: 72 mg/dL (ref 0.0–149.0)
VLDL: 14.4 mg/dL (ref 0.0–40.0)

## 2017-08-18 LAB — TSH: TSH: 2.38 u[IU]/mL (ref 0.35–4.50)

## 2017-08-18 LAB — IRON: IRON: 147 ug/dL — AB (ref 42–145)

## 2017-08-18 LAB — FERRITIN: Ferritin: 32.1 ng/mL (ref 10.0–291.0)

## 2017-08-18 MED ORDER — EZETIMIBE-SIMVASTATIN 10-40 MG PO TABS: 1.0000 | ORAL_TABLET | Freq: Every day | ORAL | 1 refills | Status: DC

## 2017-08-18 MED ORDER — PROPRANOLOL HCL 10 MG PO TABS: 10.0000 mg | ORAL_TABLET | Freq: Every day | ORAL | 1 refills | Status: DC

## 2017-08-18 MED ORDER — SERTRALINE HCL 100 MG PO TABS: 100.0000 mg | ORAL_TABLET | Freq: Every day | ORAL | 1 refills | Status: DC

## 2017-08-18 NOTE — Progress Notes (Signed)
Pre visit review using our clinic review tool, if applicable. No additional management support is needed unless otherwise documented below in the visit note. 

## 2017-08-18 NOTE — Progress Notes (Signed)
Subjective:    Patient ID: Carol Wilson, female    DOB: [birth year], 71 y.o.   MRN: [id number]  DOS:  08/18/2017 Type of visit - description : cpx Interval history: Feeling very well, tremors responding well to primidone. Had bilateral lens implants, vision is great.  Wt Readings from Last 3 Encounters:  08/18/17 158 lb 6 oz (71.8 kg)  08/09/17 158 lb (71.7 kg)  01/10/17 163 lb (73.9 kg)     Review of Systems Over the last year has diarrhea from time to time. No nausea, vomiting, blood in the stools or weight loss. No heartburn or postprandial indigestion.  Other than above, a 14 point review of systems is negative      Past Medical History:  Diagnosis Date  . Depression    started ~ 2008 after she lost a daughter  . Familial tremor   . HTN (hypertension)    well controlled with propranolol  . Hyperlipidemia   . Osteopenia   . Rosacea    sees derm    Past Surgical History:  Procedure Laterality Date  . APPENDECTOMY    . lenses implant B 2018 Bilateral 2018  . TUBAL LIGATION      Social History   Social History  . Marital status: Married    Spouse name: N/A  . Number of children: 2  . Years of education: N/A   Occupational History  . retired-- cooks for the school [employer]  . Smoking status: Never Smoker  . Smokeless tobacco: Never Used  . Alcohol use Yes     Comment: once a month  . Drug use: No  . Sexual activity: Not Currently   Other Topics Concern  . Not on file   Social History Narrative   Very active--   lost her only daughter to lung cancer ~ 2008, lost a 66 old month baby years ago , no living children, takes care of  his [name], 71 y/o     Family History  Problem Relation Age of Onset  . Lung cancer Daughter   . Coronary artery disease Mother        M 58y/o and F 68y/o  . Heart attack Father   . Alcohol abuse Brother   . Tuberculosis Daughter   . Diabetes Neg Hx   . Hypertension Neg Hx     . Stroke Neg Hx   . Colon cancer Neg Hx   . Breast cancer Neg Hx      Allergies as of 08/18/2017      Reactions   Sulfa Antibiotics Rash   RASH      Medication List       Accurate as of 08/18/17  6:00 PM. Always use your most recent med list.          ALPRAZolam 0.5 MG tablet Commonly known as:  XANAX 1/2 to 1 by mouth at bedtime as needed.   aspirin 81 MG tablet Take 81 mg by mouth daily.   CALCIUM PO Take 1 tablet by mouth daily.   ezetimibe-simvastatin 10-40 MG tablet Commonly known as:  VYTORIN Take 1 tablet by mouth daily.   fluticasone 50 MCG/ACT nasal spray Commonly known as:  FLONASE Place 2 sprays into both nostrils daily.   metroNIDAZOLE 1 % gel Commonly known as:  METROGEL Apply 1 application topically daily.   primidone 50 MG tablet Commonly known as:  MYSOLINE Take 0.5 tablets (25 mg total) by mouth  at bedtime.   propranolol 10 MG tablet Commonly known as:  INDERAL Take 1 tablet (10 mg total) by mouth daily.   sertraline 100 MG tablet Commonly known as:  ZOLOFT Take 1 tablet (100 mg total) by mouth daily.   VITAMIN D (CHOLECALCIFEROL) PO Take 1 tablet by mouth daily.            Discharge Care Instructions        Start     Ordered   08/18/17 0000  Flu vaccine HIGH DOSE PF (Fluzone High dose)     08/18/17 1154   08/18/17 0000  Td : Tetanus/diphtheria >7yo Preservative  free     08/18/17 1154   08/18/17 0000  Comp Met (CMET)     08/18/17 1154   08/18/17 0000  Lipid panel     08/18/17 1154   08/18/17 0000  CBC w/Diff     08/18/17 1154   08/18/17 0000  TSH     08/18/17 1154   08/18/17 0000  Iron     08/18/17 1154   08/18/17 0000  Ferritin     08/18/17 1154   08/18/17 0000  propranolol (INDERAL) 10 MG tablet  Daily     08/18/17 1156   08/18/17 0000  sertraline (ZOLOFT) 100 MG tablet  Daily     08/18/17 1156   08/18/17 0000  ezetimibe-simvastatin (VYTORIN) 10-40 MG tablet  Daily     08/18/17 1156         Objective:    Physical Exam BP 116/74 (BP Location: Left Arm, Patient Position: Sitting, Cuff Size: Small)   Pulse (!) 55   Temp 98 F (36.7 C) (Oral)   Resp 14   Ht '5\' 6"'$  (1.676 m)   Wt 158 lb 6 oz (71.8 kg)   SpO2 95%   BMI 25.56 kg/m    General:   Well developed, well nourished . NAD.  Neck: No  thyromegaly  HEENT:  Normocephalic . Face symmetric, atraumatic Lungs:  CTA B Normal respiratory effort, no intercostal retractions, no accessory muscle use. Heart: RRR,  no murmur.  No pretibial edema bilaterally  Abdomen:  Not distended, soft, non-tender. No rebound or rigidity.   Skin: Exposed areas without rash. Not pale. Not jaundice Neurologic:  alert & oriented X3.  Speech within normal, much less tremor noted today, gait appropriate for age and unassisted Strength symmetric and appropriate for age.  Psych: Cognition and judgment appear intact.  Cooperative with normal attention span and concentration.  Behavior appropriate. No anxious or depressed appearing.    Assessment & Plan:   Assessment Hyperlipidemia  Depression , insomnia --onset 2008, lost a daughter Neurology: -Essential tremor- Dr Tat -Hyper reflexion Osteopenia dexa: 2012, 2014, 01/14/2016: T score -0.7. Rosacea   Sees dermatology Allergic rhinitis  PLAN: Hyperlipidemia: On Vytorin, checking labs Depression and insomnia: on  Sertraline, rarely takes Xanax, control it. Essential tremor: Great response to primidone, also on a reduced dose of Inderal, 10 mg daily. Mild anemia, iron deficient: Failed GI referral few months ago, on iron, GI ROS (-)  except for occasional diarrhea. Up-to-date on colon cancer screening. Plan: Stop iron, check labs, re-eval in 6 months. If iron deficiency persist, will need further eval. RTC 4 months

## 2017-08-18 NOTE — Assessment & Plan Note (Addendum)
-  Td 07-2017; Pneumonia shot - 2012 ; prevnar 2015; shingles shot :completed;  Flu shot today   -Cervical cancer screening: no further PAPs per guidelines, pt agrees -Breast cancer screening:  MMG (-) 07-2017 -CCS: no FH; colonoscopy 2002 (-) ;   Colonoscopy again 05/2011, next 2022 per gi  -Labs: CMP, FLP, CBC, iron, ferritin, TSH -Diet and exercise discussed

## 2017-08-18 NOTE — Patient Instructions (Addendum)
GO TO THE LAB : Get the blood work     GO TO THE FRONT DESK Schedule your next appointment for a  checkup in 4 months regards anemia  Stop iron

## 2017-08-18 NOTE — Assessment & Plan Note (Signed)
Hyperlipidemia: On Vytorin, checking labs Depression and insomnia: on  Sertraline, rarely takes Xanax, control it. Essential tremor: Great response to primidone, also on a reduced dose of Inderal, 10 mg daily. Mild anemia, iron deficient: Failed GI referral few months ago, on iron, GI ROS (-)  except for occasional diarrhea. Up-to-date on colon cancer screening. Plan: Stop iron, check labs, re-eval in 6 months. If iron deficiency persist, will need further eval. RTC 4 months

## 2017-10-17 ENCOUNTER — Other Ambulatory Visit: Payer: Self-pay | Admitting: Neurology

## 2017-10-17 MED ORDER — PRIMIDONE 50 MG PO TABS: 25.0000 mg | ORAL_TABLET | Freq: Every day | ORAL | 1 refills | Status: DC

## 2017-12-19 ENCOUNTER — Encounter: Payer: Self-pay | Admitting: Internal Medicine

## 2017-12-19 ENCOUNTER — Ambulatory Visit: Payer: Medicare Other | Admitting: Internal Medicine

## 2017-12-19 VITALS — BP 124/68 | HR 61 | Temp 98.1°F | Resp 14 | Ht 66.0 in | Wt 157.1 lb

## 2017-12-19 DIAGNOSIS — R5383 Other fatigue: Secondary | ICD-10-CM | POA: Diagnosis not present

## 2017-12-19 DIAGNOSIS — F32A Depression, unspecified: Secondary | ICD-10-CM

## 2017-12-19 DIAGNOSIS — G4700 Insomnia, unspecified: Secondary | ICD-10-CM | POA: Diagnosis not present

## 2017-12-19 DIAGNOSIS — R197 Diarrhea, unspecified: Secondary | ICD-10-CM

## 2017-12-19 DIAGNOSIS — F329 Major depressive disorder, single episode, unspecified: Secondary | ICD-10-CM

## 2017-12-19 DIAGNOSIS — D649 Anemia, unspecified: Secondary | ICD-10-CM | POA: Diagnosis not present

## 2017-12-19 LAB — CBC WITH DIFFERENTIAL/PLATELET
BASOS ABS: 0 10*3/uL (ref 0.0–0.1)
Basophils Relative: 0.8 % (ref 0.0–3.0)
Eosinophils Absolute: 0.1 10*3/uL (ref 0.0–0.7)
Eosinophils Relative: 2.6 % (ref 0.0–5.0)
HCT: 42.8 % (ref 36.0–46.0)
HEMOGLOBIN: 14.4 g/dL (ref 12.0–15.0)
Lymphocytes Relative: 39 % (ref 12.0–46.0)
Lymphs Abs: 1.8 10*3/uL (ref 0.7–4.0)
MCHC: 33.6 g/dL (ref 30.0–36.0)
MCV: 87.6 fl (ref 78.0–100.0)
MONO ABS: 0.5 10*3/uL (ref 0.1–1.0)
Monocytes Relative: 10.2 % (ref 3.0–12.0)
Neutro Abs: 2.2 10*3/uL (ref 1.4–7.7)
Neutrophils Relative %: 47.4 % (ref 43.0–77.0)
Platelets: 256 10*3/uL (ref 150.0–400.0)
RBC: 4.89 Mil/uL (ref 3.87–5.11)
RDW: 12.5 % (ref 11.5–15.5)
WBC: 4.6 10*3/uL (ref 4.0–10.5)

## 2017-12-19 LAB — IRON: IRON: 85 ug/dL (ref 42–145)

## 2017-12-19 LAB — FERRITIN: FERRITIN: 12.3 ng/mL (ref 10.0–291.0)

## 2017-12-19 NOTE — Progress Notes (Signed)
Subjective:    Patient ID: Carol Wilson, female    DOB: Feb 07, 1946, 72 y.o.   MRN: [id number]  DOS:  12/19/2017 Type of visit - description : rov Interval history: Since the last office visit she has developed some problems. -October 2018, she developed diarrhea: has a loose BM immediately after each meal.  Symptoms are getting more noticeable, some days she has 3 loose BMs a day. -She is also more tired, thinks related to  stress. She is taking care of his 2 in-laws who are bedridden and live close by. Stress is consequently increased, good compliance with Zoloft,  has some residual anxiety but is handling it well.  No depression   Review of Systems Denies suicidal ideas No blood in the stools.  No nausea or vomiting.  No abdominal pain.  No unusual weight gain or loss. She feels fatigued, I asked her if she is sleeping okay, she said sometimes not, usually has "too many things in her mind" despite that, she takes Xanax rarely.  Admits to some snoring but denies feeling a sleepy throughout the day, just tired.  Past Medical History:  Diagnosis Date  . Depression    started ~ 2008 after she lost a daughter  . Familial tremor   . HTN (hypertension)    well controlled with propranolol  . Hyperlipidemia   . Osteopenia   . Rosacea    sees derm    Past Surgical History:  Procedure Laterality Date  . APPENDECTOMY    . lenses implant B 2018 Bilateral 2018  . TUBAL LIGATION      Social History   Socioeconomic History  . Marital status: Married    Spouse name: Not on file  . Number of children: 2  . Years of education: Not on file  . Highest education level: Not on file  Social Needs  . Financial resource strain: Not on file  . Food insecurity - worry: Not on file  . Food insecurity - inability: Not on file  . Transportation needs - medical: Not on file  . Transportation needs - non-medical: Not on file  Occupational History  . Occupation: retired-- cooks for the  school    Employer: [employer]  Tobacco Use  . Smoking status: Never Smoker  . Smokeless tobacco: Never Used  Substance and Sexual Activity  . Alcohol use: Yes    Comment: once a month  . Drug use: No  . Sexual activity: Not Currently  Other Topics Concern  . Not on file  Social History Narrative   Very active--   lost her only daughter to lung cancer ~ 2008, lost a 29 old month baby years ago , no living children, takes care of  his [name], 72 y/o      Allergies as of 12/19/2017      Reactions   Sulfa Antibiotics Rash   RASH      Medication List        Accurate as of 12/19/17 11:59 PM. Always use your most recent med list.          ALPRAZolam 0.5 MG tablet Commonly known as:  XANAX 1/2 to 1 by mouth at bedtime as needed.   aspirin 81 MG tablet Take 81 mg by mouth daily.   CALCIUM PO Take 1 tablet by mouth daily.   ezetimibe-simvastatin 10-40 MG tablet Commonly known as:  VYTORIN Take 1 tablet by mouth daily.   fluticasone 50 MCG/ACT nasal spray Commonly known as:  FLONASE Place 2 sprays into both nostrils daily.   metroNIDAZOLE 1 % gel Commonly known as:  METROGEL Apply 1 application topically daily.   primidone 50 MG tablet Commonly known as:  MYSOLINE Take 0.5 tablets (25 mg total) by mouth at bedtime.   propranolol 10 MG tablet Commonly known as:  INDERAL Take 1 tablet (10 mg total) by mouth daily.   sertraline 100 MG tablet Commonly known as:  ZOLOFT Take 1 tablet (100 mg total) by mouth daily.   VITAMIN D (CHOLECALCIFEROL) PO Take 1 tablet by mouth daily.          Objective:   Physical Exam BP 124/68 (BP Location: Left Arm, Patient Position: Sitting, Cuff Size: Small)   Pulse 61   Temp 98.1 F (36.7 C) (Oral)   Resp 14   Ht 5\' 6"  (1.676 m)   Wt 157 lb 2 oz (71.3 kg)   SpO2 97%   BMI 25.36 kg/m  General:   Well developed, well nourished . NAD.  HEENT:  Normocephalic . Face symmetric, atraumatic Lungs:  CTA B Normal  respiratory effort, no intercostal retractions, no accessory muscle use. Heart: RRR,  no murmur.  no pretibial edema bilaterally  Abdomen:  Not distended, soft, non-tender. No rebound or rigidity.   Skin: Not pale. Not jaundice Neurologic:  alert & oriented X3.  Speech normal, gait appropriate for age and unassisted.  Tremor noted.  At baseline or somewhat better. Psych--  Cognition and judgment appear intact.  Cooperative with normal attention span and concentration.  Behavior appropriate. slt  anxious but no depressed appearing.     Assessment & Plan:   Assessment Hyperlipidemia  Depression , insomnia --onset 2008, lost a daughter Neurology: -Essential tremor- Dr Tat -Hyper reflexion Osteopenia dexa: 2012, 2014, 01/14/2016: T score -0.7. Rosacea   Sees dermatology Allergic rhinitis  PLAN: Depression insomnia: Increase his stress lately due to taking care of her in-laws; has some anxiety and poor sleep.  We agreed to continue on sertraline, encouraged to take Xanax more frequently if necessary, does need to rest at night. Counseled Diarrhea: Sx  started around October 2018, gradually worse, has a loose BM after each meal.  Etiology unclear, refer to GI. Mild anemia, iron deficiency: Stopped iron supplements, recheck labs. Fatigue: Suspect due to stress and not sleeping well, see above.  Reassess on RTC RTC 4 months  F2F > 25

## 2017-12-19 NOTE — Patient Instructions (Signed)
GO TO THE LAB : Get the blood work     GO TO THE FRONT DESK Schedule your next appointment for a   checkup in 4 months  

## 2017-12-19 NOTE — Progress Notes (Signed)
Pre visit review using our clinic review tool, if applicable. No additional management support is needed unless otherwise documented below in the visit note. 

## 2017-12-20 NOTE — Assessment & Plan Note (Signed)
Depression insomnia: Increase his stress lately due to taking care of her in-laws; has some anxiety and poor sleep.  We agreed to continue on sertraline, encouraged to take Xanax more frequently if necessary, does need to rest at night. Counseled Diarrhea: Sx  started around October 2018, gradually worse, has a loose BM after each meal.  Etiology unclear, refer to GI. Mild anemia, iron deficiency: Stopped iron supplements, recheck labs. Fatigue: Suspect due to stress and not sleeping well, see above.  Reassess on RTC RTC 4 months

## 2017-12-29 ENCOUNTER — Other Ambulatory Visit: Payer: Self-pay | Admitting: Internal Medicine

## 2017-12-31 ENCOUNTER — Encounter: Payer: Self-pay | Admitting: Internal Medicine

## 2018-01-02 ENCOUNTER — Encounter: Payer: Self-pay | Admitting: Gastroenterology

## 2018-01-04 NOTE — Progress Notes (Addendum)
Subjective:   Carol Wilson is a 72 y.o. female who presents for Medicare Annual (Subsequent) preventive examination. Pt reports she feels great!  Review of Systems: No ROS.  Medicare Wellness Visit. Additional risk factors are reflected in the social history. Cardiac Risk Factors include: advanced age (>63men, >55 women);dyslipidemia Sleep: Xanax at bedtime as needed. Pt states she sleeps very well 7-8 hrs.  Home Safety/Smoke Alarms: Feels safe in home. Smoke alarms in place.  Living environment; residence and Firearm Safety: 2 story home. No issues with stairs. Lives with husband. Seat Belt Safety/Bike Helmet: Wears seat belt.   Female:   Pap- last 12/29/12-normal. No longer doing routine screening due to age. Mammo-  Last 08/11/17: BI-RADS CATEGORY  1: Negative. Dexa scan- last 01/18/16:  osteopenia      CCS- last 06/22/11. Recall 10 yrs per Dr.Dora Olevia Perches.  Dentist- Dr.Kwon every 6 months. Objective:     Vitals: BP 130/62 (BP Location: Left Arm, Patient Position: Sitting, Cuff Size: Normal)   Pulse (!) 59   Wt 157 lb 9.6 oz (71.5 kg)   SpO2 97%   BMI 25.44 kg/m   Body mass index is 25.44 kg/m.  Advanced Directives 01/12/2018 01/10/2017  Does Patient Have a Medical Advance Directive? Yes Yes  Type of Paramedic of [place];Living will [place];Living will  Copy of [place] in Chart? No - copy requested No - copy requested    Tobacco Social History   Tobacco Use  Smoking Status Never Smoker  Smokeless Tobacco Never Used     Counseling given: Not Answered   Clinical Intake: Pain : No/denies pain   Past Medical History:  Diagnosis Date  . Depression    started ~ 2008 after she lost a daughter  . Familial tremor   . HTN (hypertension)    well controlled with propranolol  . Hyperlipidemia   . Osteopenia   . Rosacea    sees derm   Past Surgical History:  Procedure Laterality Date  .  APPENDECTOMY    . lenses implant B 2018 Bilateral 2018  . TUBAL LIGATION     Family History  Problem Relation Age of Onset  . Lung cancer Daughter   . Coronary artery disease Mother        M 58y/o and F 68y/o  . Heart attack Father   . Alcohol abuse Brother   . Tuberculosis Daughter   . Diabetes Neg Hx   . Hypertension Neg Hx   . Stroke Neg Hx   . Colon cancer Neg Hx   . Breast cancer Neg Hx    Social History   Socioeconomic History  . Marital status: Married    Spouse name: None  . Number of children: 2  . Years of education: None  . Highest education level: None  Social Needs  . Financial resource strain: None  . Food insecurity - worry: None  . Food insecurity - inability: None  . Transportation needs - medical: None  . Transportation needs - non-medical: None  Occupational History  . Occupation: retired-- cooks for the school    Employer: [employer]  Tobacco Use  . Smoking status: Never Smoker  . Smokeless tobacco: Never Used  Substance and Sexual Activity  . Alcohol use: Yes    Comment: once a month  . Drug use: No  . Sexual activity: No  Other Topics Concern  . None  Social History Narrative   Very active--  lost her only daughter to lung cancer ~ 2008, lost a 45 old month baby years ago , no living children, takes care of  his [name], 72 y/o    Outpatient Encounter Medications as of 01/12/2018  Medication Sig  . ALPRAZolam (XANAX) 0.5 MG tablet 1/2 to 1 by mouth at bedtime as needed.  Marland Kitchen aspirin 81 MG tablet Take 81 mg by mouth daily.    Marland Kitchen CALCIUM PO Take 1 tablet by mouth daily.  Marland Kitchen ezetimibe-simvastatin (VYTORIN) 10-40 MG tablet TAKE 1 TABLET BY MOUTH  DAILY  . fluticasone (FLONASE) 50 MCG/ACT nasal spray Place 2 sprays into both nostrils daily.  . metroNIDAZOLE (METROGEL) 1 % gel Apply 1 application topically daily.  . primidone (MYSOLINE) 50 MG tablet Take 0.5 tablets (25 mg total) by mouth at bedtime.  . propranolol (INDERAL) 10 MG tablet Take  1 tablet (10 mg total) by mouth daily.  . sertraline (ZOLOFT) 100 MG tablet Take 1 tablet (100 mg total) by mouth daily.  Marland Kitchen VITAMIN D, CHOLECALCIFEROL, PO Take 1 tablet by mouth daily.   No facility-administered encounter medications on file as of 01/12/2018.     Activities of Daily Living In your present state of health, do you have any difficulty performing the following activities: 01/12/2018  Hearing? N  Vision? N  Comment Wears reading glasses.  Has lens implants. Dr.Bernstroff yearly.   Difficulty concentrating or making decisions? N  Walking or climbing stairs? N  Dressing or bathing? N  Doing errands, shopping? N  Preparing Food and eating ? N  Using the Toilet? N  In the past six months, have you accidently leaked urine? N  Do you have problems with loss of bowel control? N  Managing your Medications? N  Managing your Finances? N  Housekeeping or managing your Housekeeping? N  Some recent data might be hidden    Patient Care Team: Colon Branch, MD as PCP - General (Internal Medicine) Tat, Eustace Quail, DO as Consulting Physician (Neurology)    Assessment:   This is a routine wellness examination for Carol Wilson. Physical assessment deferred to PCP.  Exercise Activities and Dietary recommendations Current Exercise Habits: Structured exercise class, Type of exercise: treadmill;strength training/weights, Time (Minutes): 60, Frequency (Times/Week): 2, Weekly Exercise (Minutes/Week): 120, Intensity: Mild   Diet (meal preparation, eat out, water intake, caffeinated beverages, dairy products, fruits and vegetables): well balanced, on average, 3+ meals per day  24 hr recall:  Breakfast: croissant and 3 cups coffee.  Lunch: Salad and baked potato Dinner:  2 grilled cheese sandwiches. Water Cookies and coffee  Goals    . Maintain healthy active lifestyle.       Fall Risk Fall Risk  01/12/2018 08/09/2017 01/10/2017 11/25/2016 08/25/2016  Falls in the past year? No No No No No    Depression Screen PHQ 2/9 Scores 01/12/2018 01/10/2017 08/13/2016 08/13/2015  PHQ - 2 Score 0 0 0 0     Cognitive Function MMSE - Mini Mental State Exam 01/12/2018 01/10/2017  Orientation to time 5 5  Orientation to Place 5 5  Registration 3 3  Attention/ Calculation 5 5  Recall 3 1  Language- name 2 objects 2 2  Language- repeat 1 1  Language- follow 3 step command 3 3  Language- read & follow direction 1 1  Write a sentence 1 1  Copy design 1 1  Total score 30 28        Immunization History  Administered Date(s) Administered  . Influenza Whole  08/22/2009  . Influenza, High Dose Seasonal PF 08/16/2013, 08/13/2016, 08/18/2017  . Influenza,inj,Quad PF,6+ Mos 08/06/2014, 08/13/2015  . Pneumococcal Conjugate-13 08/06/2014  . Pneumococcal Polysaccharide-23 06/08/2011  . Td 02/22/2007, 08/18/2017  . Zoster 12/15/2009    Screening Tests Health Maintenance  Topic Date Due  . MAMMOGRAM  08/11/2018  . COLONOSCOPY  06/21/2021  . TETANUS/TDAP  08/19/2027  . INFLUENZA VACCINE  Completed  . DEXA SCAN  Completed  . Hepatitis C Screening  Completed  . PNA vac Low Risk Adult  Completed      Plan:   Follow up with Dr.Paz as scheduled 04/19/18 @10 .  Continue to eat heart healthy diet (full of fruits, vegetables, whole grains, lean protein, water--limit salt, fat, and sugar intake) and increase physical activity as tolerated.  Continue doing brain stimulating activities (puzzles, reading, adult coloring books, staying active) to keep memory sharp.   Bring a copy of your living will and/or healthcare power of attorney to your next office visit.  I have ordered your Bone Density Scan for on or after 01/20/18. They should call you to schedule appt.     I have personally reviewed and noted the following in the patient's chart:   . Medical and social history . Use of alcohol, tobacco or illicit drugs  . Current medications and supplements . Functional ability and  status . Nutritional status . Physical activity . Advanced directives . List of other physicians . Hospitalizations, surgeries, and ER visits in previous 12 months . Vitals . Screenings to include cognitive, depression, and falls . Referrals and appointments  In addition, I have reviewed and discussed with patient certain preventive protocols, quality metrics, and best practice recommendations. A written personalized care plan for preventive services as well as general preventive health recommendations were provided to patient.     Naaman Plummer [place], [place]  01/12/2018 Kathlene November, MD

## 2018-01-12 ENCOUNTER — Ambulatory Visit: Payer: Medicare Other | Admitting: *Deleted

## 2018-01-12 ENCOUNTER — Encounter: Payer: Self-pay | Admitting: *Deleted

## 2018-01-12 ENCOUNTER — Ambulatory Visit (INDEPENDENT_AMBULATORY_CARE_PROVIDER_SITE_OTHER): Payer: Medicare Other | Admitting: *Deleted

## 2018-01-12 VITALS — BP 130/62 | HR 59 | Wt 157.6 lb

## 2018-01-12 DIAGNOSIS — Z780 Asymptomatic menopausal state: Secondary | ICD-10-CM

## 2018-01-12 DIAGNOSIS — Z0000 Encounter for general adult medical examination without abnormal findings: Secondary | ICD-10-CM

## 2018-01-12 NOTE — Patient Instructions (Signed)
Follow up with Dr.Paz as scheduled 04/19/18 '@10'$ .  Continue to eat heart healthy diet (full of fruits, vegetables, whole grains, lean protein, water--limit salt, fat, and sugar intake) and increase physical activity as tolerated.  Continue doing brain stimulating activities (puzzles, reading, adult coloring books, staying active) to keep memory sharp.   Bring a copy of your living will and/or healthcare power of attorney to your next office visit.  So nice to meet you!!!   Carol Wilson , Thank you for taking time to come for your Medicare Wellness Visit. I appreciate your ongoing commitment to your health goals. Please review the following plan we discussed and let me know if I can assist you in the future.   These are the goals we discussed: Goals    . Maintain healthy active lifestyle.       This is a list of the screening recommended for you and due dates:  Health Maintenance  Topic Date Due  . Mammogram  08/11/2018  . Colon Cancer Screening  06/21/2021  . Tetanus Vaccine  08/19/2027  . Flu Shot  Completed  . DEXA scan (bone density measurement)  Completed  .  Hepatitis C: One time screening is recommended by Center for Disease Control  (CDC) for  adults born from [birth year] through 1965.   Completed  . Pneumonia vaccines  Completed    Health Maintenance for Postmenopausal Women Menopause is a normal process in which your reproductive ability comes to an end. This process happens gradually over a span of months to years, usually between the ages of 60 and 20. Menopause is complete when you have missed 12 consecutive menstrual periods. It is important to talk with your health care provider about some of the most common conditions that affect postmenopausal women, such as heart disease, cancer, and bone loss (osteoporosis). Adopting a healthy lifestyle and getting preventive care can help to promote your health and wellness. Those actions can also lower your chances of developing some of  these common conditions. What should I know about menopause? During menopause, you may experience a number of symptoms, such as:  Moderate-to-severe hot flashes.  Night sweats.  Decrease in sex drive.  Mood swings.  Headaches.  Tiredness.  Irritability.  Memory problems.  Insomnia.  Choosing to treat or not to treat menopausal changes is an individual decision that you make with your health care provider. What should I know about hormone replacement therapy and supplements? Hormone therapy products are effective for treating symptoms that are associated with menopause, such as hot flashes and night sweats. Hormone replacement carries certain risks, especially as you become older. If you are thinking about using estrogen or estrogen with progestin treatments, discuss the benefits and risks with your health care provider. What should I know about heart disease and stroke? Heart disease, heart attack, and stroke become more likely as you age. This may be due, in part, to the hormonal changes that your body experiences during menopause. These can affect how your body processes dietary fats, triglycerides, and cholesterol. Heart attack and stroke are both medical emergencies. There are many things that you can do to help prevent heart disease and stroke:  Have your blood pressure checked at least every 1-2 years. High blood pressure causes heart disease and increases the risk of stroke.  If you are 39-74 years old, ask your health care provider if you should take aspirin to prevent a heart attack or a stroke.  Do not use any tobacco products, including  cigarettes, chewing tobacco, or electronic cigarettes. If you need help quitting, ask your health care provider.  It is important to eat a healthy diet and maintain a healthy weight. ? Be sure to include plenty of vegetables, fruits, low-fat dairy products, and lean protein. ? Avoid eating foods that are high in solid fats, added  sugars, or salt (sodium).  Get regular exercise. This is one of the most important things that you can do for your health. ? Try to exercise for at least 150 minutes each week. The type of exercise that you do should increase your heart rate and make you sweat. This is known as moderate-intensity exercise. ? Try to do strengthening exercises at least twice each week. Do these in addition to the moderate-intensity exercise.  Know your numbers.Ask your health care provider to check your cholesterol and your blood glucose. Continue to have your blood tested as directed by your health care provider.  What should I know about cancer screening? There are several types of cancer. Take the following steps to reduce your risk and to catch any cancer development as early as possible. Breast Cancer  Practice breast self-awareness. ? This means understanding how your breasts normally appear and feel. ? It also means doing regular breast self-exams. Let your health care provider know about any changes, no matter how small.  If you are 1 or older, have a clinician do a breast exam (clinical breast exam or CBE) every year. Depending on your age, family history, and medical history, it may be recommended that you also have a yearly breast X-ray (mammogram).  If you have a family history of breast cancer, talk with your health care provider about genetic screening.  If you are at high risk for breast cancer, talk with your health care provider about having an MRI and a mammogram every year.  Breast cancer (BRCA) gene test is recommended for women who have family members with BRCA-related cancers. Results of the assessment will determine the need for genetic counseling and BRCA1 and for BRCA2 testing. BRCA-related cancers include these types: ? Breast. This occurs in males or females. ? Ovarian. ? Tubal. This may also be called fallopian tube cancer. ? Cancer of the abdominal or pelvic lining (peritoneal  cancer). ? Prostate. ? Pancreatic.  Cervical, Uterine, and Ovarian Cancer Your health care provider may recommend that you be screened regularly for cancer of the pelvic organs. These include your ovaries, uterus, and vagina. This screening involves a pelvic exam, which includes checking for microscopic changes to the surface of your cervix (Pap test).  For women ages 21-65, health care providers may recommend a pelvic exam and a Pap test every three years. For women ages 86-65, they may recommend the Pap test and pelvic exam, combined with testing for human papilloma virus (HPV), every five years. Some types of HPV increase your risk of cervical cancer. Testing for HPV may also be done on women of any age who have unclear Pap test results.  Other health care providers may not recommend any screening for nonpregnant women who are considered low risk for pelvic cancer and have no symptoms. Ask your health care provider if a screening pelvic exam is right for you.  If you have had past treatment for cervical cancer or a condition that could lead to cancer, you need Pap tests and screening for cancer for at least 20 years after your treatment. If Pap tests have been discontinued for you, your risk factors (such as  having a new sexual partner) need to be reassessed to determine if you should start having screenings again. Some women have medical problems that increase the chance of getting cervical cancer. In these cases, your health care provider may recommend that you have screening and Pap tests more often.  If you have a family history of uterine cancer or ovarian cancer, talk with your health care provider about genetic screening.  If you have vaginal bleeding after reaching menopause, tell your health care provider.  There are currently no reliable tests available to screen for ovarian cancer.  Lung Cancer Lung cancer screening is recommended for adults 65-83 years old who are at high risk for  lung cancer because of a history of smoking. A yearly low-dose CT scan of the lungs is recommended if you:  Currently smoke.  Have a history of at least 30 pack-years of smoking and you currently smoke or have quit within the past 15 years. A pack-year is smoking an average of one pack of cigarettes per day for one year.  Yearly screening should:  Continue until it has been 15 years since you quit.  Stop if you develop a health problem that would prevent you from having lung cancer treatment.  Colorectal Cancer  This type of cancer can be detected and can often be prevented.  Routine colorectal cancer screening usually begins at age 90 and continues through age 71.  If you have risk factors for colon cancer, your health care provider may recommend that you be screened at an earlier age.  If you have a family history of colorectal cancer, talk with your health care provider about genetic screening.  Your health care provider may also recommend using home test kits to check for hidden blood in your stool.  A small camera at the end of a tube can be used to examine your colon directly (sigmoidoscopy or colonoscopy). This is done to check for the earliest forms of colorectal cancer.  Direct examination of the colon should be repeated every 5-10 years until age 86. However, if early forms of precancerous polyps or small growths are found or if you have a family history or genetic risk for colorectal cancer, you may need to be screened more often.  Skin Cancer  Check your skin from head to toe regularly.  Monitor any moles. Be sure to tell your health care provider: ? About any new moles or changes in moles, especially if there is a change in a mole's shape or color. ? If you have a mole that is larger than the size of a pencil eraser.  If any of your family members has a history of skin cancer, especially at a young age, talk with your health care provider about genetic  screening.  Always use sunscreen. Apply sunscreen liberally and repeatedly throughout the day.  Whenever you are outside, protect yourself by wearing long sleeves, pants, a wide-brimmed hat, and sunglasses.  What should I know about osteoporosis? Osteoporosis is a condition in which bone destruction happens more quickly than new bone creation. After menopause, you may be at an increased risk for osteoporosis. To help prevent osteoporosis or the bone fractures that can happen because of osteoporosis, the following is recommended:  If you are 18-8 years old, get at least 1,000 mg of calcium and at least 600 mg of vitamin D per day.  If you are older than age 61 but younger than age 49, get at least 1,200 mg of calcium and  at least 600 mg of vitamin D per day.  If you are older than age 27, get at least 1,200 mg of calcium and at least 800 mg of vitamin D per day.  Smoking and excessive alcohol intake increase the risk of osteoporosis. Eat foods that are rich in calcium and vitamin D, and do weight-bearing exercises several times each week as directed by your health care provider. What should I know about how menopause affects my mental health? Depression may occur at any age, but it is more common as you become older. Common symptoms of depression include:  Low or sad mood.  Changes in sleep patterns.  Changes in appetite or eating patterns.  Feeling an overall lack of motivation or enjoyment of activities that you previously enjoyed.  Frequent crying spells.  Talk with your health care provider if you think that you are experiencing depression. What should I know about immunizations? It is important that you get and maintain your immunizations. These include:  Tetanus, diphtheria, and pertussis (Tdap) booster vaccine.  Influenza every year before the flu season begins.  Pneumonia vaccine.  Shingles vaccine.  Your health care provider may also recommend other  immunizations. This information is not intended to replace advice given to you by your health care provider. Make sure you discuss any questions you have with your health care provider. Document Released: 12/31/2005 Document Revised: 05/28/2016 Document Reviewed: 08/12/2015 Elsevier Interactive Patient Education  2018 [employer].

## 2018-02-09 ENCOUNTER — Other Ambulatory Visit: Payer: Self-pay | Admitting: Internal Medicine

## 2018-02-09 MED ORDER — ALPRAZOLAM 0.5 MG PO TABS: 0.2500 mg | ORAL_TABLET | Freq: Every evening | ORAL | 0 refills | Status: DC | PRN

## 2018-02-09 NOTE — Telephone Encounter (Signed)
Xanax refill request - controlled substance  LOV 12/19/17  Dr. Larose Kells  [facility] - [place], [place]

## 2018-02-09 NOTE — Telephone Encounter (Signed)
Sent!

## 2018-02-09 NOTE — Telephone Encounter (Signed)
Copied from [place]. Topic: Quick Communication - Rx Refill/Question >> Feb 09, 2018 12:12 PM Wynetta Emery, [place] C wrote:   Medication: ALPRAZolam (XANAX) 0.5 MG tablet and also fluticasone (FLONASE) 50 MCG/ACT nasal spray   Has the patient contacted their pharmacy? No    (Agent: If no, request that the patient contact the pharmacy for the refill.)   Preferred Pharmacy (with phone number or street name): [facility], [place] [phone] (Phone) [phone] (Fax)     Agent: Please be advised that RX refills may take up to 3 business days. We ask that you follow-up with your pharmacy.

## 2018-02-09 NOTE — Telephone Encounter (Signed)
Pt is requesting refill on alprazolam 0.5mg .   Last OV: 12/19/2017, AWV w/ Angel on 01/12/2018 Last Fill: 12/29/2012 #90 and 1RF UDS: 08/13/2015 Low risk  NCCR printed- no issues noted  Needs UDS and contract

## 2018-02-10 MED ORDER — FLUTICASONE PROPIONATE 50 MCG/ACT NA SUSP: 2.0000 | Freq: Every day | NASAL | 3 refills | Status: AC

## 2018-02-10 NOTE — Telephone Encounter (Signed)
Flonase sent

## 2018-02-10 NOTE — Telephone Encounter (Signed)
[facility] calling and stating, fluticasone (FLONASE) 50 MCG/ACT nasal spray was not sent with Xanax, see request below. Ref #  [id number]

## 2018-02-10 NOTE — Addendum Note (Signed)
Addended byDamita Dunnings D on: 02/10/2018 09:45 AM   Modules accepted: Orders

## 2018-02-16 ENCOUNTER — Other Ambulatory Visit: Payer: Self-pay | Admitting: Neurology

## 2018-02-16 ENCOUNTER — Other Ambulatory Visit (INDEPENDENT_AMBULATORY_CARE_PROVIDER_SITE_OTHER): Payer: Medicare Other

## 2018-02-16 ENCOUNTER — Ambulatory Visit: Payer: Medicare Other | Admitting: Gastroenterology

## 2018-02-16 ENCOUNTER — Encounter: Payer: Self-pay | Admitting: Gastroenterology

## 2018-02-16 VITALS — BP 98/62 | HR 64 | Ht 66.0 in | Wt 159.4 lb

## 2018-02-16 DIAGNOSIS — K529 Noninfective gastroenteritis and colitis, unspecified: Secondary | ICD-10-CM

## 2018-02-16 LAB — IGA: IgA: 153 mg/dL (ref 68–378)

## 2018-02-16 NOTE — Patient Instructions (Addendum)
If you are age 72 or older, your body mass index should be between 23-30. Your Body mass index is 25.73 kg/m. If this is out of the aforementioned range listed, please consider follow up with your Primary Care Provider.  If you are age 22 or younger, your body mass index should be between 19-25. Your Body mass index is 25.73 kg/m. If this is out of the aformentioned range listed, please consider follow up with your Primary Care Provider.   You have been scheduled for a colonoscopy. Please follow written instructions given to you at your visit today.  Please pick up your prep supplies at the pharmacy within the next 1-3 days. If you use inhalers (even only as needed), please bring them with you on the day of your procedure. Your physician has requested that you go to www.startemmi.com and enter the access code given to you at your visit today. This web site gives a general overview about your procedure. However, you should still follow specific instructions given to you by our office regarding your preparation for the procedure.  We are giving you a Plenvu colon prep sample today.  Please go to the lab in the basement of our building to have lab work done as you leave today.   Thank you for entrusting me with your care and for choosing [facility], Dr. [place] Cellar

## 2018-02-16 NOTE — Progress Notes (Signed)
HPI :  72 y/o female with a history of depression, benign tremor, osteopenia, referred by Dr. Kathlene November for chronic diarrhea.   The patient states she has been suffering from chronic diarrhea for the past 9 months or so.  She is averaging approximately 5-6 bowel movements per day.  She denies not see any blood in her stool.  She does have a urgency and has had episodes of incontinence due to this.  She has been taking one Imodium every morning but has not seemed to help too much.  She denies any nocturnal symptoms.  She does have some lower abdominal cramping with this.  She denies any NSAID use.  She denies any weight loss.  She states she is eating well without any nausea or vomiting.  She denies any family history of celiac disease, Crohn's disease, colon cancer.  She has been using Zoloft for the past 10 years for depression.  She states she is not had any trouble with this medication since she started it.  Of note she was noted to be iron deficient about a year ago.  She was placed on iron and her deficiency resolved.  She was taken off iron and her follow-up labs have showed no recurrence of iron deficiency or anemia.  Her last colonoscopy was in 2012 which showed some benign hyperplastic polyps, no concerning pathology.  Colonoscopy 06/22/2011 - few rectosigmoid polyps - hyperplastic polyps   Past Medical History:  Diagnosis Date  . Anxiety   . Colon polyps   . Depression    started ~ 2008 after she lost a daughter  . Familial tremor   . HTN (hypertension)    well controlled with propranolol  . Hyperlipidemia   . Osteopenia   . Rosacea    sees derm     Past Surgical History:  Procedure Laterality Date  . APPENDECTOMY    . lenses implant B 2018 Bilateral 2018  . TUBAL LIGATION     Family History  Problem Relation Age of Onset  . Lung cancer Daughter   . Coronary artery disease Mother        M 58y/o and F 68y/o  . Heart attack Father   . Alcohol abuse Brother   .  Tuberculosis Daughter   . Diabetes Neg Hx   . Hypertension Neg Hx   . Stroke Neg Hx   . Colon cancer Neg Hx   . Breast cancer Neg Hx    Social History   Tobacco Use  . Smoking status: Never Smoker  . Smokeless tobacco: Never Used  Substance Use Topics  . Alcohol use: Yes    Comment: once a month  . Drug use: No   Current Outpatient Medications  Medication Sig Dispense Refill  . ALPRAZolam (XANAX) 0.5 MG tablet Take 0.5-1 tablets (0.25-0.5 mg total) by mouth at bedtime as needed for sleep. 90 tablet 0  . aspirin 81 MG tablet Take 81 mg by mouth daily.      Marland Kitchen CALCIUM PO Take 1 tablet by mouth daily.    Marland Kitchen ezetimibe-simvastatin (VYTORIN) 10-40 MG tablet TAKE 1 TABLET BY MOUTH  DAILY 90 tablet 1  . fluticasone (FLONASE) 50 MCG/ACT nasal spray Place 2 sprays into both nostrils daily. 48 g 3  . metroNIDAZOLE (METROGEL) 1 % gel Apply 1 application topically daily. 60 g 12  . primidone (MYSOLINE) 50 MG tablet Take 0.5 tablets (25 mg total) by mouth at bedtime. 45 tablet 1  . primidone (MYSOLINE) 50  MG tablet TAKE ONE-HALF TABLET BY  MOUTH AT BEDTIME 45 tablet 1  . propranolol (INDERAL) 10 MG tablet Take 1 tablet (10 mg total) by mouth daily. 90 tablet 1  . sertraline (ZOLOFT) 100 MG tablet Take 1 tablet (100 mg total) by mouth daily. 90 tablet 1  . VITAMIN D, CHOLECALCIFEROL, PO Take 1 tablet by mouth daily.     No current facility-administered medications for this visit.    Allergies  Allergen Reactions  . Sulfa Antibiotics Rash    RASH     Review of Systems: All systems reviewed and negative except where noted in HPI.   Lab Results  Component Value Date   WBC 4.6 12/19/2017   HGB 14.4 12/19/2017   HCT 42.8 12/19/2017   MCV 87.6 12/19/2017   PLT 256.0 12/19/2017    Lab Results  Component Value Date   CREATININE 0.87 08/18/2017   BUN 15 08/18/2017   NA 137 08/18/2017   K 4.5 08/18/2017   CL 104 08/18/2017   CO2 31 08/18/2017    Lab Results  Component Value Date     ALT 19 08/18/2017   AST 21 08/18/2017   ALKPHOS 67 08/18/2017   BILITOT 0.6 08/18/2017     Physical Exam: BP 98/62   Pulse 64   Ht 5\' 6"  (1.676 m)   Wt 159 lb 6.4 oz (72.3 kg)   BMI 25.73 kg/m  Constitutional: Pleasant,well-developed, female in no acute distress. HEENT: Normocephalic and atraumatic. Conjunctivae are normal. No scleral icterus. Neck supple.  Cardiovascular: Normal rate, regular rhythm.  Pulmonary/chest: Effort normal and breath sounds normal. No wheezing, rales or rhonchi. Abdominal: Soft, nondistended, nontender.  There are no masses palpable. No hepatomegaly. Extremities: no edema Lymphadenopathy: No cervical adenopathy noted. Neurological: Alert and oriented to person place and time. Skin: Skin is warm and dry. No rashes noted. Psychiatric: Normal mood and affect. Behavior is normal.   ASSESSMENT AND PLAN: 72 year old female here for new patient assessment for symptoms of chronic diarrhea as outlined.  Severe diarrhea with urgency, and incontinence, significantly impairing her quality of life.  Given the severity of her symptoms I'm initially recommending a colonoscopy to further evaluate.  Given her Zoloft use she is at risk for microscopic colitis and this needs to be ruled out, I am suspicious this could be causing her symptoms.  We will also rule out any evidence of IBD.  Following discussion risks and benefits of colonoscopy she wanted to proceed,  and to do this as soon as possible. We have an opening in our schedule tomorrow and she wanted to do this tomorrow.  We will hold off on making any other changes to her medications at this time and wait for colonoscopy results.  Following her exam tomorrow she can likely increase her dose of Imodium as needed until pathology results are back.  I will also send her to the lab today to test for celiac serologies.  She agreed with the plan as outlined.  [place] Cellar, MD [facility] Pager  [phone]  CC: Colon Branch, MD

## 2018-02-17 ENCOUNTER — Encounter: Payer: Self-pay | Admitting: Gastroenterology

## 2018-02-17 ENCOUNTER — Ambulatory Visit (AMBULATORY_SURGERY_CENTER): Payer: Medicare Other | Admitting: Gastroenterology

## 2018-02-17 ENCOUNTER — Other Ambulatory Visit: Payer: Self-pay

## 2018-02-17 VITALS — BP 145/82 | HR 64 | Temp 96.6°F | Resp 10 | Ht 66.0 in | Wt 159.0 lb

## 2018-02-17 DIAGNOSIS — K529 Noninfective gastroenteritis and colitis, unspecified: Secondary | ICD-10-CM | POA: Diagnosis present

## 2018-02-17 DIAGNOSIS — D122 Benign neoplasm of ascending colon: Secondary | ICD-10-CM

## 2018-02-17 DIAGNOSIS — K639 Disease of intestine, unspecified: Secondary | ICD-10-CM

## 2018-02-17 DIAGNOSIS — D123 Benign neoplasm of transverse colon: Secondary | ICD-10-CM | POA: Diagnosis not present

## 2018-02-17 HISTORY — PX: COLONOSCOPY: SHX174

## 2018-02-17 MED ORDER — SODIUM CHLORIDE 0.9 % IV SOLN
500.0000 mL | Freq: Once | INTRAVENOUS | Status: DC
Start: 2018-02-17 — End: 2018-04-10

## 2018-02-17 NOTE — Progress Notes (Signed)
Report to PACU, RN, vss, BBS= Clear.  

## 2018-02-17 NOTE — Op Note (Signed)
[facility] Patient Name: Carol Wilson Procedure Date: 02/17/2018 9:23 AM MRN: [id number] Endoscopist: Remo Lipps P. Armbruster MD, MD Age: 72 Referring MD:  Date of Birth: Jun 09, 1946 Gender: Female Account #: [id number] Procedure:                Colonoscopy Indications:              Chronic diarrhea, history of Zoloft use Medicines:                Monitored Anesthesia Care Procedure:                Pre-Anesthesia Assessment:                           - Prior to the procedure, a History and Physical                            was performed, and patient medications and                            allergies were reviewed. The patient's tolerance of                            previous anesthesia was also reviewed. The risks                            and benefits of the procedure and the sedation                            options and risks were discussed with the patient.                            All questions were answered, and informed consent                            was obtained. Prior Anticoagulants: The patient has                            taken no previous anticoagulant or antiplatelet                            agents. ASA Grade Assessment: II - A patient with                            mild systemic disease. After reviewing the risks                            and benefits, the patient was deemed in                            satisfactory condition to undergo the procedure.                           After obtaining informed consent, the colonoscope  was passed under direct vision. Throughout the                            procedure, the patient's blood pressure, pulse, and                            oxygen saturations were monitored continuously. The                            Model PCF-H190DL [id number]) scope was introduced                            through the anus and advanced to the the terminal                            ileum, with  identification of the appendiceal                            orifice and IC valve. The colonoscopy was performed                            without difficulty. The patient tolerated the                            procedure well. The quality of the bowel                            preparation was adequate. The terminal ileum,                            ileocecal valve, appendiceal orifice, and rectum                            were photographed. Scope In: 9:27:12 AM Scope Out: 9:49:52 AM Scope Withdrawal Time: 0 hours 17 minutes 9 seconds  Total Procedure Duration: 0 hours 22 minutes 40 seconds  Findings:                 The perianal and digital rectal examinations were                            normal.                           One 6 mm nodule was found appendiceal orifice. Bite                            on bite biopsies were taken with a cold forceps for                            histology.                           The terminal ileum appeared normal.  Two sessile polyps were found in the ascending                            colon. The polyps were 5 to 6 mm in size. These                            polyps were removed with a cold snare. Resection                            and retrieval were complete.                           A 5 mm polyp was found in the transverse colon. The                            polyp was sessile. The polyp was removed with a                            cold snare. Resection and retrieval were complete.                           The colon was tortuous.                           Internal hemorrhoids were found during                            retroflexion. The hemorrhoids were moderate.                           The exam was otherwise without abnormality.                           Biopsies for histology were taken with a cold                            forceps from the right colon, left colon and                            transverse  colon for evaluation of microscopic                            colitis. Complications:            No immediate complications. Estimated blood loss:                            Minimal. Estimated Blood Loss:     Estimated blood loss was minimal. Impression:               - Nodule at the appendiceal orifice. Biopsied.                           - The examined portion of the ileum was normal.                           -  Two 5 to 6 mm polyps in the ascending colon,                            removed with a cold snare. Resected and retrieved.                           - One 5 mm polyp in the transverse colon, removed                            with a cold snare. Resected and retrieved.                           - Tortuous colon.                           - Internal hemorrhoids.                           - The examination was otherwise normal.                           - Biopsies were taken with a cold forceps from the                            right colon, left colon and transverse colon for                            evaluation of microscopic colitis. Recommendation:           - Patient has a contact number available for                            emergencies. The signs and symptoms of potential                            delayed complications were discussed with the                            patient. Return to normal activities tomorrow.                            Written discharge instructions were provided to the                            patient.                           - Resume previous diet.                           - Continue present medications.                           - Increase immodium as needed for treatment of  diarrhea while biopsies pending                           - Await pathology results.                           - Repeat colonoscopy is recommended for                            surveillance. The colonoscopy date will be                             determined after pathology results from today's                            exam become available for review. Remo Lipps P. Armbruster MD, MD 02/17/2018 9:55:47 AM This report has been signed electronically.

## 2018-02-17 NOTE — Progress Notes (Signed)
Called to room to assist during endoscopic procedure.  Patient ID and intended procedure confirmed with present staff. Received instructions for my participation in the procedure from the performing physician.  

## 2018-02-17 NOTE — Progress Notes (Signed)
Pt's states no medical or surgical changes since previsit or office visit. 

## 2018-02-17 NOTE — Patient Instructions (Signed)
YOU HAD AN ENDOSCOPIC PROCEDURE TODAY AT THE [facility]:   Refer to the procedure report that was given to you for any specific questions about what was found during the examination.  If the procedure report does not answer your questions, please call your gastroenterologist to clarify.  If you requested that your care partner not be given the details of your procedure findings, then the procedure report has been included in a sealed envelope for you to review at your convenience later.  YOU SHOULD EXPECT: Some feelings of bloating in the abdomen. Passage of more gas than usual.  Walking can help get rid of the air that was put into your GI tract during the procedure and reduce the bloating. If you had a lower endoscopy (such as a colonoscopy or flexible sigmoidoscopy) you may notice spotting of blood in your stool or on the toilet paper. If you underwent a bowel prep for your procedure, you may not have a normal bowel movement for a few days.  Please Note:  You might notice some irritation and congestion in your nose or some drainage.  This is from the oxygen used during your procedure.  There is no need for concern and it should clear up in a day or so.  SYMPTOMS TO REPORT IMMEDIATELY:   Following lower endoscopy (colonoscopy or flexible sigmoidoscopy):  Excessive amounts of blood in the stool  Significant tenderness or worsening of abdominal pains  Swelling of the abdomen that is new, acute  Fever of 100F or higher  Please see handouts on polyps and hemorrhoids.  For urgent or emergent issues, a gastroenterologist can be reached at any hour by calling [phone].   DIET:  We do recommend a small meal at first, but then you may proceed to your regular diet.  Drink plenty of fluids but you should avoid alcoholic beverages for 24 hours.  ACTIVITY:  You should plan to take it easy for the rest of today and you should NOT DRIVE or use heavy machinery until tomorrow (because of  the sedation medicines used during the test).    FOLLOW UP: Our staff will call the number listed on your records the next business day following your procedure to check on you and address any questions or concerns that you may have regarding the information given to you following your procedure. If we do not reach you, we will leave a message.  However, if you are feeling well and you are not experiencing any problems, there is no need to return our call.  We will assume that you have returned to your regular daily activities without incident.  If any biopsies were taken you will be contacted by phone or by letter within the next 1-3 weeks.  Please call us at [phone] if you have not heard about the biopsies in 3 weeks.    SIGNATURES/CONFIDENTIALITY: You and/or your care partner have signed paperwork which will be entered into your electronic medical record.  These signatures attest to the fact that that the information above on your After Visit Summary has been reviewed and is understood.  Full responsibility of the confidentiality of this discharge information lies with you and/or your care-partner.   Thank you for allowing us to provide your healthcare today. 

## 2018-02-20 ENCOUNTER — Telehealth: Payer: Self-pay

## 2018-02-20 LAB — TISSUE TRANSGLUTAMINASE ABS,IGG,IGA
(TTG) AB, IGG: 3 U/mL
(tTG) Ab, IgA: 1 U/mL

## 2018-02-20 NOTE — Telephone Encounter (Signed)
  Follow up Call-  Call back number 02/17/2018  Post procedure Call Back phone  # [phone]  Permission to leave phone message Yes  Some recent data might be hidden     Patient questions:  Do you have a fever, pain , or abdominal swelling? No. Pain Score  0 *  Have you tolerated food without any problems? Yes.    Have you been able to return to your normal activities? Yes.    Do you have any questions about your discharge instructions: Diet   No. Medications  No. Follow up visit  No.  Do you have questions or concerns about your Care? No.  Actions: * If pain score is 4 or above: No action needed, pain <4.

## 2018-02-21 ENCOUNTER — Other Ambulatory Visit: Payer: Self-pay | Admitting: Internal Medicine

## 2018-02-22 ENCOUNTER — Other Ambulatory Visit: Payer: Self-pay

## 2018-02-23 ENCOUNTER — Telehealth: Payer: Self-pay | Admitting: Gastroenterology

## 2018-02-23 ENCOUNTER — Other Ambulatory Visit: Payer: Self-pay

## 2018-02-23 MED ORDER — COLESTIPOL HCL 1 G PO TABS: 1.0000 g | ORAL_TABLET | Freq: Two times a day (BID) | ORAL | 3 refills | Status: DC

## 2018-02-23 NOTE — Telephone Encounter (Signed)
See result note.  

## 2018-04-07 NOTE — Progress Notes (Signed)
Carol Wilson was seen today in the movement disorders clinic for neurologic consultation at the request of Colon Branch, MD.  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  Pt reports that she has had tremor for 25 years.  It is in both hands.  She is L hand dominant.  Both hands shake equally.  Pt reports that she was started on propranolol 25 years ago for BP and it helped tremor but tremor has progressed some over the years.  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  When first started on it she had fatigue but doesn't anymore.  11/25/16 update:  Patient was seen today back in follow-up for essential tremor.  Last visit, we started her on primidone.  She also takes propranolol 10 mg daily.  She reports that the new primidone has "helped so much and I feel great."  States that she is only taking 25 mg because she did so great and never needed to go up to 50 mg daily.    08/09/17 update:  Patient seen in follow-up for essential tremor.  She is on low-dose primidone, 25 mg at night.  She is also on propranolol, 10 mg daily.   "I'm doing unbelievably well."   The records that were made available to me were reviewed.  Due to see PCP next week.  She got cataracts removed and implants and now doesn't have to wear glasses and is very happy.    04/10/18 update: Patient is seen today in follow-up for essential tremor.  She is on combination therapy, but both medications are very low-dose.  She is on primidone, 50 mg, half a tablet at night and propranolol, 10 mg daily.  The patient reports that "I'm doing so great.  It is so much better than it was."  Records have been reviewed since our last visit.  Patient has been seeing gastroenterology for chronic diarrhea.  She had a colonoscopy.  3 polyps were removed and biopsies were negative for colitis.  She is still having it daily.  Told that zoloft  may be causing it.   ALLERGIES:   Allergies  Allergen Reactions  . Sulfa Antibiotics Rash    RASH    CURRENT MEDICATIONS:  Outpatient Encounter Medications as of 04/10/2018  Medication Sig  . ALPRAZolam (XANAX) 0.5 MG tablet Take 0.5-1 tablets (0.25-0.5 mg total) by mouth at bedtime as needed for sleep.  Marland Kitchen aspirin 81 MG tablet Take 81 mg by mouth daily.    . colestipol (COLESTID) 1 g tablet Take 1 tablet (1 g total) by mouth 2 (two) times daily.  Marland Kitchen ezetimibe-simvastatin (VYTORIN) 10-40 MG tablet TAKE 1 TABLET BY MOUTH  DAILY  . fluticasone (FLONASE) 50 MCG/ACT nasal spray Place 2 sprays into both nostrils daily.  Marland Kitchen loperamide (IMODIUM) 2 MG capsule Take 2 mg by mouth daily.  . metroNIDAZOLE (METROGEL) 1 % gel Apply 1 application topically daily.  . primidone (MYSOLINE) 50 MG tablet TAKE ONE-HALF TABLET BY  MOUTH AT BEDTIME  . propranolol (INDERAL) 10 MG tablet Take 1 tablet (10 mg total) by mouth daily.  . sertraline (ZOLOFT) 100 MG tablet Take 1 tablet (100 mg total) by mouth daily.  . [DISCONTINUED] CALCIUM PO Take 1 tablet by mouth daily.  . [DISCONTINUED] VITAMIN D, CHOLECALCIFEROL, PO Take 1 tablet by mouth daily.  . [DISCONTINUED] 0.9 %  sodium chloride infusion    No facility-administered encounter medications on file as of 04/10/2018.     PAST MEDICAL HISTORY:   Past Medical History:  Diagnosis Date  . Anxiety   . Colon polyps   . Depression    started ~ 2008 after she lost a daughter  . Familial tremor   . HTN (hypertension)    well controlled with propranolol  . Hyperlipidemia   . Osteopenia   . Rosacea    sees derm    PAST SURGICAL HISTORY:   Past Surgical History:  Procedure Laterality Date  . APPENDECTOMY    . lenses implant B 2018 Bilateral 2018  . TUBAL LIGATION      SOCIAL HISTORY:   Social History   Socioeconomic History  . Marital status: Married    Spouse name: Not on file  . Number of children: 2  . Years of education: Not on file  .  Highest education level: Not on file  Occupational History  . Occupation: retired-- cooks for the school    Employer: [employer]  . Financial resource strain: Not on file  . Food insecurity:    Worry: Not on file    Inability: Not on file  . Transportation needs:    Medical: Not on file    Non-medical: Not on file  Tobacco Use  . Smoking status: Never Smoker  . Smokeless tobacco: Never Used  Substance and Sexual Activity  . Alcohol use: Yes    Comment: once a month  . Drug use: No  . Sexual activity: Never  Lifestyle  . Physical activity:    Days per week: Not on file    Minutes per session: Not on file  . Stress: Not on file  Relationships  . Social connections:    Talks on phone: Not on file    Gets together: Not on file    Attends religious service: Not on file    Active member of club or organization: Not on file    Attends meetings of clubs or organizations: Not on file    Relationship status: Not on file  . Intimate partner violence:    Fear of current or ex partner: Not on file    Emotionally abused: Not on file    Physically abused: Not on file    Forced sexual activity: Not on file  Other Topics Concern  . Not on file  Social History Narrative   Very active--   lost her only daughter to lung cancer ~ 2008, lost a 80 old month baby years ago , no living children, takes care of  his [name], 72 y/o    FAMILY HISTORY:   Family Status  Relation Name Status  . Daughter  Deceased  . Mother  Deceased  . Father  Deceased  . Sister  Alive  . Brother  Deceased  . Daughter  Deceased  . Neg Hx  (Not Specified)    ROS:  A complete 10 system review of systems was obtained and was unremarkable apart from what is mentioned above.  PHYSICAL EXAMINATION:    VITALS:   Vitals:   04/10/18 0834  BP: 96/62  Pulse: 60  SpO2: 96%  Weight: 157 lb (71.2 kg)  Height: 5\' 6"  (1.676 m)    GEN:  The patient appears stated age and is in NAD. HEENT:   Normocephalic, atraumatic.  The mucous membranes are moist. The superficial temporal arteries are without ropiness or tenderness.  CV:  RRR Lungs:  CTAB Neck/HEME:  There are no carotid bruits bilaterally.  Neurological examination:  Orientation: The patient is alert and oriented x3. Cranial nerves: There is good facial symmetry. The speech is fluent and clear. Soft palate rises symmetrically and there is no tongue deviation. Hearing is intact to conversational tone. Sensation: Sensation is intact to light touch throughout Motor: Strength is 5/5 in the bilateral upper and lower extremities.   Shoulder shrug is equal and symmetric.  There is no pronator drift.  Movement examination: Tone: There is normal tone in the RUE.  There is normal tone in the LUE.  There is normal tone in the RLE.  There is normaltone in the LLE.  Abnormal movements: virtually no postural or intention tremor.  she has no difficulty with archimedes spirals. Gait and Station: The patient has no difficulty arising out of a deep-seated chair without the use of the hands. The patient's stride length is normal.      Lab Results  Component Value Date   TSH 2.38 08/18/2017     Chemistry      Component Value Date/Time   NA 137 08/18/2017 1159   K 4.5 08/18/2017 1159   CL 104 08/18/2017 1159   CO2 31 08/18/2017 1159   BUN 15 08/18/2017 1159   CREATININE 0.87 08/18/2017 1159      Component Value Date/Time   CALCIUM 10.4 08/18/2017 1159   ALKPHOS 67 08/18/2017 1159   AST 21 08/18/2017 1159   ALT 19 08/18/2017 1159   BILITOT 0.6 08/18/2017 1159     No results found for: VITAMINB12   ASSESSMENT/PLAN:  1.  Essential Tremor.  -pt is much improved on primidone and is only on 25 mg daily and will continue on that.  -on propranolol 10 mg daily.  2.  Hyperflexia  -talked to her about the fact that this could be physiologic, but she is quite hyperreflexic.  Talked to her about the fact that this could mean a  central, brain lesion or also a cervical spine lesion.  She denies any neck pain.  She wants to hold off on any neuroimaging for now.  Her neurological exam was nonfocal and nonlateralizing.  If she developed falls, difficulty walking, or any focal or lateralizing symptoms, our threshold for imaging should be quite low.  3.  Since doing well, she will be d/c back to the full care of PCP.  Can let me know if needs me in the future.    Cc:  Colon Branch, MD

## 2018-04-10 ENCOUNTER — Encounter: Payer: Self-pay | Admitting: Neurology

## 2018-04-10 ENCOUNTER — Ambulatory Visit: Payer: Medicare Other | Admitting: Neurology

## 2018-04-10 VITALS — BP 96/62 | HR 60 | Ht 66.0 in | Wt 157.0 lb

## 2018-04-10 DIAGNOSIS — G250 Essential tremor: Secondary | ICD-10-CM | POA: Diagnosis not present

## 2018-04-10 NOTE — Patient Instructions (Signed)
So good to see you!  Let me know if you need me in the future!

## 2018-04-18 ENCOUNTER — Telehealth: Payer: Self-pay | Admitting: Internal Medicine

## 2018-04-19 ENCOUNTER — Ambulatory Visit (INDEPENDENT_AMBULATORY_CARE_PROVIDER_SITE_OTHER): Payer: Medicare Other | Admitting: Internal Medicine

## 2018-04-19 ENCOUNTER — Encounter: Payer: Self-pay | Admitting: Internal Medicine

## 2018-04-19 ENCOUNTER — Telehealth: Payer: Self-pay | Admitting: Gastroenterology

## 2018-04-19 VITALS — BP 112/76 | HR 57 | Temp 98.6°F | Resp 14 | Ht 66.0 in | Wt 156.0 lb

## 2018-04-19 DIAGNOSIS — F419 Anxiety disorder, unspecified: Secondary | ICD-10-CM | POA: Diagnosis not present

## 2018-04-19 DIAGNOSIS — F32A Depression, unspecified: Secondary | ICD-10-CM

## 2018-04-19 DIAGNOSIS — Z79899 Other long term (current) drug therapy: Secondary | ICD-10-CM | POA: Diagnosis not present

## 2018-04-19 DIAGNOSIS — G4700 Insomnia, unspecified: Secondary | ICD-10-CM

## 2018-04-19 DIAGNOSIS — G250 Essential tremor: Secondary | ICD-10-CM

## 2018-04-19 DIAGNOSIS — K529 Noninfective gastroenteritis and colitis, unspecified: Secondary | ICD-10-CM

## 2018-04-19 DIAGNOSIS — F329 Major depressive disorder, single episode, unspecified: Secondary | ICD-10-CM | POA: Diagnosis not present

## 2018-04-19 MED ORDER — ALPRAZOLAM 0.5 MG PO TABS: 0.2500 mg | ORAL_TABLET | Freq: Every evening | ORAL | 1 refills | Status: DC | PRN

## 2018-04-19 NOTE — Telephone Encounter (Signed)
Spoke to patient and she did try one month's worth of Colestid, states that it has not helped. She still has 4-5 loose stools daily. She has continued to take the imodium which she states does a little bit better job than the Colestid.  Suggested she try a probiotic, Align, to see if that helps. Let her know that Dr. Havery Moros is out of the office this week, but will contact her next week. Dr. Havery Moros in path result note, had wanted to know how she was doing and if not better, would consider something else. Patient can be reached on her cell phone next week.

## 2018-04-19 NOTE — Progress Notes (Signed)
Pre visit review using our clinic review tool, if applicable. No additional management support is needed unless otherwise documented below in the visit note. 

## 2018-04-19 NOTE — Telephone Encounter (Signed)
Pt is requesting refill on alprazolam 0.5mg .   Last OV: 12/19/2017, appt today Last Fill: 02/09/2018 #90 and 0rf UDS: 08/13/2015 Low risk  Needs UDS and contract at [facility] 02/09/2018- no discrepancies noted- in media  Please advise.

## 2018-04-19 NOTE — Telephone Encounter (Signed)
rx sent

## 2018-04-19 NOTE — Patient Instructions (Signed)
GO TO THE LAB : Provide a urine sample   GO TO THE FRONT DESK Schedule your next appointment for a physical exam by 07-2018   Please reach out to your gastroenterologist

## 2018-04-19 NOTE — Progress Notes (Signed)
Subjective:    Patient ID: Carol Wilson, female    DOB: 16-Jul-1946, 72 y.o.   MRN: [id number]  DOS:  04/19/2018 Type of visit - description : f/u Interval history: See last visit, we addressed several issues. Diarrhea: Saw GI, work-up was done, not feeling better. Anxiety depression: A lot of his stress lately however things are slightly better in the last few weeks/days On Xanax, needs a UDS and contract.   Review of Systems Continue with multiple BMs daily, stools are loose, sometimes foamy, no blood.  + Gurgling sounds from the abdomen at night Last visit she reported fatigue, that is improving.  Past Medical History:  Diagnosis Date  . Anxiety   . Colon polyps   . Depression    started ~ 2008 after she lost a daughter  . Familial tremor   . HTN (hypertension)    well controlled with propranolol  . Hyperlipidemia   . Osteopenia   . Rosacea    sees derm    Past Surgical History:  Procedure Laterality Date  . APPENDECTOMY    . lenses implant B 2018 Bilateral 2018  . TUBAL LIGATION      Social History   Socioeconomic History  . Marital status: Married    Spouse name: Not on file  . Number of children: 2  . Years of education: Not on file  . Highest education level: Not on file  Occupational History  . Occupation: retired-- cooks for the school    Employer: [employer]  . Financial resource strain: Not on file  . Food insecurity:    Worry: Not on file    Inability: Not on file  . Transportation needs:    Medical: Not on file    Non-medical: Not on file  Tobacco Use  . Smoking status: Never Smoker  . Smokeless tobacco: Never Used  Substance and Sexual Activity  . Alcohol use: Yes    Comment: once a month  . Drug use: No  . Sexual activity: Never  Lifestyle  . Physical activity:    Days per week: Not on file    Minutes per session: Not on file  . Stress: Not on file  Relationships  . Social connections:    Talks on phone: Not  on file    Gets together: Not on file    Attends religious service: Not on file    Active member of club or organization: Not on file    Attends meetings of clubs or organizations: Not on file    Relationship status: Not on file  . Intimate partner violence:    Fear of current or ex partner: Not on file    Emotionally abused: Not on file    Physically abused: Not on file    Forced sexual activity: Not on file  Other Topics Concern  . Not on file  Social History Narrative   Very active--   lost her only daughter to lung cancer ~ 2008, lost a 32 old month baby years ago , no living children, takes care of  his [name], 72 y/o      Allergies as of 04/19/2018      Reactions   Sulfa Antibiotics Rash   RASH      Medication List        Accurate as of 04/19/18 11:59 PM. Always use your most recent med list.          ALPRAZolam 0.5 MG tablet Commonly  known as:  XANAX Take 0.5-1 tablets (0.25-0.5 mg total) by mouth at bedtime as needed for sleep.   aspirin 81 MG tablet Take 81 mg by mouth daily.   colestipol 1 g tablet Commonly known as:  COLESTID Take 1 tablet (1 g total) by mouth 2 (two) times daily.   ezetimibe-simvastatin 10-40 MG tablet Commonly known as:  VYTORIN TAKE 1 TABLET BY MOUTH  DAILY   fluticasone 50 MCG/ACT nasal spray Commonly known as:  FLONASE Place 2 sprays into both nostrils daily.   loperamide 2 MG capsule Commonly known as:  IMODIUM Take 2 mg by mouth daily.   metroNIDAZOLE 1 % gel Commonly known as:  METROGEL Apply 1 application topically daily.   primidone 50 MG tablet Commonly known as:  MYSOLINE TAKE ONE-HALF TABLET BY  MOUTH AT BEDTIME   propranolol 10 MG tablet Commonly known as:  INDERAL Take 1 tablet (10 mg total) by mouth daily.   sertraline 100 MG tablet Commonly known as:  ZOLOFT Take 1 tablet (100 mg total) by mouth daily.          Objective:   Physical Exam BP 112/76 (BP Location: Left Arm, Patient Position: Sitting,  Cuff Size: Small)   Pulse (!) 57   Temp 98.6 F (37 C) (Oral)   Resp 14   Ht 5\' 6"  (1.676 m)   Wt 156 lb (70.8 kg)   SpO2 97%   BMI 25.18 kg/m  General:   Well developed, well nourished . NAD.  HEENT:  Normocephalic . Face symmetric, atraumatic Abdomen:  Not distended, soft, non-tender. No rebound or rigidity.   Skin: Not pale. Not jaundice Neurologic:  alert & oriented X3.  Speech normal, gait appropriate for age and unassisted Psych--  Cognition and judgment appear intact.  Cooperative with normal attention span and concentration.  Behavior appropriate. No anxious or depressed appearing.     Assessment & Plan:   Assessment Hyperlipidemia  Depression , insomnia --onset 2008, lost a daughter Neurology: -Essential tremor- Dr Tat -Hyper reflexion Osteopenia dexa: 2012, 2014, 01/14/2016: T score -0.7. Rosacea   Sees dermatology Allergic rhinitis  PLAN: Depression, insomnia: Continue with stress, since the last visit she lost her in-laws, now dealing with a number of issues including selling their house.  She anticipates things are going to get better in the long run and currently doing okay.. UDS and contract today (on xanax) Diarrhea: Saw GI, celiac work-up negative, had a colonoscopy 01-2018, had polyps, biopsy was negative for colitis, trial with Colestid failed.  Encouraged patient to reach out to GI, states she will.  In the meantime recommend a trial with Metamucil. Fatigue: see last OV. Improving Essential tremor: Saw neurology recently, okay to go back as needed, I can start refilling her medications. RTC 07-2018 CPX

## 2018-04-20 ENCOUNTER — Other Ambulatory Visit: Payer: Self-pay

## 2018-04-20 DIAGNOSIS — K529 Noninfective gastroenteritis and colitis, unspecified: Secondary | ICD-10-CM | POA: Insufficient documentation

## 2018-04-20 DIAGNOSIS — G250 Essential tremor: Secondary | ICD-10-CM | POA: Insufficient documentation

## 2018-04-20 MED ORDER — ELUXADOLINE 100 MG PO TABS: 100.0000 mg | ORAL_TABLET | Freq: Two times a day (BID) | ORAL | 0 refills | Status: DC

## 2018-04-20 NOTE — Telephone Encounter (Signed)
Thanks for the update Almyra Free sorry to hear she is not doing better.  If the colestid isn't helping she should stop it. I think at this point a trial of Viberzi starter pack may be reasonable at 100mg  BID. I would like to give her a sample first to see if it works prior to writing full prescription. She should stop immodium if using this. I don't see any contraindications for it, (I think she still has her gallbladder in place, can you confirm with her prior to giving this).  I'd like to hear back from her in 2 weeks to see how she is doing on this regimen. thanks

## 2018-04-20 NOTE — Assessment & Plan Note (Signed)
Depression, insomnia: Continue with stress, since the last visit she lost her in-laws, now dealing with a number of issues including selling their house.  She anticipates things are going to get better in the long run and currently doing okay.. UDS and contract today (on xanax) Diarrhea: Saw GI, celiac work-up negative, had a colonoscopy 01-2018, had polyps, biopsy was negative for colitis, trial with Colestid failed.  Encouraged patient to reach out to GI, states she will.  In the meantime recommend a trial with Metamucil. Fatigue: see last OV. Improving Essential tremor: Saw neurology recently, okay to go back as needed, I can start refilling her medications. RTC 07-2018 CPX

## 2018-04-20 NOTE — Telephone Encounter (Signed)
Spoke to patient, she is willing to try Viberzi samples and will come pick them up at our front desk. She understands to stop the Colestid and stop the imodium, call back to office in 2 weeks to give update.

## 2018-04-22 LAB — PAIN MGMT, PROFILE 8 W/CONF, U
6 Acetylmorphine: NEGATIVE ng/mL (ref <10)
ALPHAHYDROXYALPRAZOLAM: NEGATIVE ng/mL (ref <25)
ALPHAHYDROXYMIDAZOLAM: NEGATIVE ng/mL (ref <50)
Alcohol Metabolites: NEGATIVE ng/mL (ref <500)
Alphahydroxytriazolam: NEGATIVE ng/mL (ref <50)
Aminoclonazepam: NEGATIVE ng/mL (ref <25)
Amphetamines: NEGATIVE ng/mL (ref <500)
BENZODIAZEPINES: NEGATIVE ng/mL (ref <100)
Buprenorphine, Urine: NEGATIVE ng/mL (ref <5)
COCAINE METABOLITE: NEGATIVE ng/mL (ref <150)
Creatinine: 130.3 mg/dL
HYDROXYETHYLFLURAZEPAM: NEGATIVE ng/mL (ref <50)
LORAZEPAM: NEGATIVE ng/mL (ref <50)
MARIJUANA METABOLITE: NEGATIVE ng/mL (ref <20)
MDMA: NEGATIVE ng/mL (ref <500)
Nordiazepam: NEGATIVE ng/mL (ref <50)
OXAZEPAM: NEGATIVE ng/mL (ref <50)
OXYCODONE: NEGATIVE ng/mL (ref <100)
Opiates: NEGATIVE ng/mL (ref <100)
Oxidant: NEGATIVE ug/mL (ref <200)
TEMAZEPAM: NEGATIVE ng/mL (ref <50)
pH: 6.03 (ref 4.5–9.0)

## 2018-05-04 ENCOUNTER — Telehealth: Payer: Self-pay | Admitting: Gastroenterology

## 2018-05-04 NOTE — Telephone Encounter (Signed)
Sorry to hear she is having a hard time affording it.  Perhaps another pharmacy such as [facility] would be cheaper? There is no alternative. We can give her a few more starter packs if they are available in the office. We can also reach out to the drug rep to see if they can provide assistance in getting this filled for her. thanks

## 2018-05-04 NOTE — Telephone Encounter (Signed)
Please advise. Thanks.  

## 2018-05-04 NOTE — Telephone Encounter (Signed)
Patient states viberzi samples seemed to work but with her insurance it Is really expensive. Patient wants to know if there is something more affordable but similar that can be prescribed.

## 2018-05-04 NOTE — Telephone Encounter (Signed)
Spoke to pt.  She has [employer] and is on SS. Medication would be over $1000.  I told her about the pt assistance program and offered to print the application for her to see if she would qualify. She was very Patent attorney. I have completed the provider portion of the application and also given her some more samples to hold her until she can hopefully get assitance. I am leaving the application paperwork and the samples at the front desk and she said she will pick up tomorrow, Friday, 05-05-18.

## 2018-05-05 ENCOUNTER — Other Ambulatory Visit: Payer: Self-pay | Admitting: Internal Medicine

## 2018-06-23 ENCOUNTER — Other Ambulatory Visit: Payer: Self-pay | Admitting: Neurology

## 2018-08-07 ENCOUNTER — Other Ambulatory Visit: Payer: Self-pay | Admitting: Internal Medicine

## 2018-08-07 DIAGNOSIS — Z1231 Encounter for screening mammogram for malignant neoplasm of breast: Secondary | ICD-10-CM

## 2018-08-16 ENCOUNTER — Ambulatory Visit (HOSPITAL_BASED_OUTPATIENT_CLINIC_OR_DEPARTMENT_OTHER): Payer: Medicare Other

## 2018-08-16 ENCOUNTER — Ambulatory Visit: Payer: Medicare Other | Admitting: Internal Medicine

## 2018-08-16 ENCOUNTER — Ambulatory Visit (HOSPITAL_BASED_OUTPATIENT_CLINIC_OR_DEPARTMENT_OTHER)
Admission: RE | Admit: 2018-08-16 | Discharge: 2018-08-16 | Disposition: A | Discharge status: Home / Self Care | Payer: Medicare Other | Source: Ambulatory Visit | Attending: Internal Medicine | Admitting: Internal Medicine

## 2018-08-16 ENCOUNTER — Encounter: Payer: Self-pay | Admitting: Internal Medicine

## 2018-08-16 VITALS — BP 124/68 | HR 68 | Temp 98.8°F | Resp 16 | Ht 66.0 in | Wt 151.5 lb

## 2018-08-16 DIAGNOSIS — G250 Essential tremor: Secondary | ICD-10-CM

## 2018-08-16 DIAGNOSIS — Z780 Asymptomatic menopausal state: Secondary | ICD-10-CM | POA: Diagnosis present

## 2018-08-16 DIAGNOSIS — Z23 Encounter for immunization: Secondary | ICD-10-CM | POA: Diagnosis not present

## 2018-08-16 DIAGNOSIS — E785 Hyperlipidemia, unspecified: Secondary | ICD-10-CM

## 2018-08-16 DIAGNOSIS — G4700 Insomnia, unspecified: Secondary | ICD-10-CM | POA: Diagnosis not present

## 2018-08-16 DIAGNOSIS — M8588 Other specified disorders of bone density and structure, other site: Secondary | ICD-10-CM | POA: Diagnosis not present

## 2018-08-16 DIAGNOSIS — M85851 Other specified disorders of bone density and structure, right thigh: Secondary | ICD-10-CM | POA: Insufficient documentation

## 2018-08-16 LAB — LIPID PANEL
CHOL/HDL RATIO: 3
Cholesterol: 163 mg/dL (ref 0–200)
HDL: 63.4 mg/dL (ref >39.00)
LDL CALC: 87 mg/dL (ref 0–99)
NonHDL: 99.79
TRIGLYCERIDES: 66 mg/dL (ref 0.0–149.0)
VLDL: 13.2 mg/dL (ref 0.0–40.0)

## 2018-08-16 LAB — BASIC METABOLIC PANEL
BUN: 17 mg/dL (ref 6–23)
CHLORIDE: 105 meq/L (ref 96–112)
CO2: 29 mEq/L (ref 19–32)
Calcium: 10.3 mg/dL (ref 8.4–10.5)
Creatinine, Ser: 0.74 mg/dL (ref 0.40–1.20)
GFR: 81.91 mL/min (ref >60.00)
GLUCOSE: 90 mg/dL (ref 70–99)
POTASSIUM: 4.8 meq/L (ref 3.5–5.1)
Sodium: 139 mEq/L (ref 135–145)

## 2018-08-16 LAB — ALT: ALT: 17 U/L (ref 0–35)

## 2018-08-16 LAB — AST: AST: 21 U/L (ref 0–37)

## 2018-08-16 NOTE — Progress Notes (Signed)
Subjective:    Patient ID: Carol Wilson, female    DOB: 06-28-1946, 72 y.o.   MRN: [id number]  DOS:  08/16/2018 Type of visit - description : rov Interval history: Diarrhea: Still an issue but improving. Anxiety: Stress still there, overall she feels better and managing things without major problems. Tremors: Well-controlled High cholesterol: Good med compliance, due for labs.   Review of Systems  Denies fever chills or weight loss No nausea, vomiting or blood in the stools.  Past Medical History:  Diagnosis Date  . Anxiety   . Colon polyps   . Depression    started ~ 2008 after she lost a daughter  . Familial tremor   . HTN (hypertension)    well controlled with propranolol  . Hyperlipidemia   . Osteopenia   . Rosacea    sees derm    Past Surgical History:  Procedure Laterality Date  . APPENDECTOMY    . lenses implant B 2018 Bilateral 2018  . TUBAL LIGATION      Social History   Socioeconomic History  . Marital status: Married    Spouse name: Not on file  . Number of children: 2  . Years of education: Not on file  . Highest education level: Not on file  Occupational History  . Occupation: retired-- cooks for the school    Employer: [employer]  . Financial resource strain: Not on file  . Food insecurity:    Worry: Not on file    Inability: Not on file  . Transportation needs:    Medical: Not on file    Non-medical: Not on file  Tobacco Use  . Smoking status: Never Smoker  . Smokeless tobacco: Never Used  Substance and Sexual Activity  . Alcohol use: Yes    Comment: once a month  . Drug use: No  . Sexual activity: Not Currently  Lifestyle  . Physical activity:    Days per week: Not on file    Minutes per session: Not on file  . Stress: Not on file  Relationships  . Social connections:    Talks on phone: Not on file    Gets together: Not on file    Attends religious service: Not on file    Active member of club or  organization: Not on file    Attends meetings of clubs or organizations: Not on file    Relationship status: Not on file  . Intimate partner violence:    Fear of current or ex partner: Not on file    Emotionally abused: Not on file    Physically abused: Not on file    Forced sexual activity: Not on file  Other Topics Concern  . Not on file  Social History Narrative   Very active--   lost her only daughter to lung cancer ~ 2008, lost a 74 old month baby years ago , no living children, takes care of  his [name], 72 y/o      Allergies as of 08/16/2018      Reactions   Sulfa Antibiotics Rash   RASH      Medication List        Accurate as of 08/16/18 11:59 PM. Always use your most recent med list.          ALPRAZolam 0.5 MG tablet Commonly known as:  XANAX Take 0.5-1 tablets (0.25-0.5 mg total) by mouth at bedtime as needed for sleep.   aspirin 81 MG tablet  Take 81 mg by mouth daily.   ezetimibe-simvastatin 10-40 MG tablet Commonly known as:  VYTORIN Take 1 tablet by mouth daily.   fluticasone 50 MCG/ACT nasal spray Commonly known as:  FLONASE Place 2 sprays into both nostrils daily.   loperamide 2 MG capsule Commonly known as:  IMODIUM Take 2 mg by mouth daily.   metroNIDAZOLE 1 % gel Commonly known as:  METROGEL Apply 1 application topically daily.   primidone 50 MG tablet Commonly known as:  MYSOLINE TAKE ONE-HALF TABLET BY  MOUTH AT BEDTIME   propranolol 10 MG tablet Commonly known as:  INDERAL Take 1 tablet (10 mg total) by mouth daily.   sertraline 100 MG tablet Commonly known as:  ZOLOFT Take 1 tablet (100 mg total) by mouth daily.          Objective:   Physical Exam BP 124/68 (BP Location: Left Arm, Patient Position: Sitting, Cuff Size: Small)   Pulse 68   Temp 98.8 F (37.1 C) (Oral)   Resp 16   Ht 5\' 6"  (1.676 m)   Wt 151 lb 8 oz (68.7 kg)   SpO2 98%   BMI 24.45 kg/m   General:   Well developed, NAD, see BMI.  HEENT:    Normocephalic . Face symmetric, atraumatic Lungs:  CTA B Normal respiratory effort, no intercostal retractions, no accessory muscle use. Heart: RRR,  no murmur.  No pretibial edema bilaterally  Skin: Not pale. Not jaundice Neurologic:  alert & oriented X3.  Speech normal, gait appropriate for age and unassisted.  Mild tremors noted.  At baseline Psych--  Cognition and judgment appear intact.  Cooperative with normal attention span and concentration.  Behavior appropriate. No anxious or depressed appearing.      Assessment & Plan:   Assessment Hyperlipidemia  Depression , insomnia --onset 2008, lost a daughter Neurology: -Essential tremor- Dr Tat -Hyper reflexion Osteopenia dexa: 2012, 2014, 01/14/2016: T score -0.7. Rosacea   Sees dermatology Allergic rhinitis  PLAN: Hyperlipidemia: Continue Vytorin.  Check BMP, LFTs, FLP. Depression, insomnia: Symptoms are currently controlled with Zoloft and  Xanax which she takes almost daily.  RF as needed. Essential tremor: Controlled, RF as needed Diarrhea: Symptoms are decreased, not completely gone, the patient however is okay the way things are going.  GI RX Viberzi (for IBS) patient stopped due cramps. Currently on Imodium as needed only. Preventive care.  High dose flu shot  RTC CPX 6 months

## 2018-08-16 NOTE — Patient Instructions (Signed)
GO TO THE LAB : Get the blood work     GO TO THE FRONT DESK Schedule your next appointment for a  Physical in 6 months  

## 2018-08-16 NOTE — Progress Notes (Signed)
Pre visit review using our clinic review tool, if applicable. No additional management support is needed unless otherwise documented below in the visit note. 

## 2018-08-17 NOTE — Assessment & Plan Note (Signed)
Hyperlipidemia: Continue Vytorin.  Check BMP, LFTs, FLP. Depression, insomnia: Symptoms are currently controlled with Zoloft and  Xanax which she takes almost daily.  RF as needed. Essential tremor: Controlled, RF as needed Diarrhea: Symptoms are decreased, not completely gone, the patient however is okay the way things are going.  GI RX Viberzi (for IBS) patient stopped due cramps. Currently on Imodium as needed only. Preventive care.  High dose flu shot  RTC CPX 6 months

## 2018-08-18 ENCOUNTER — Other Ambulatory Visit: Payer: Self-pay

## 2018-08-18 MED ORDER — ALENDRONATE SODIUM 70 MG PO TABS: 70.0000 mg | ORAL_TABLET | ORAL | 3 refills | Status: DC

## 2018-08-21 ENCOUNTER — Telehealth: Payer: Self-pay | Admitting: Internal Medicine

## 2018-08-21 ENCOUNTER — Ambulatory Visit (HOSPITAL_BASED_OUTPATIENT_CLINIC_OR_DEPARTMENT_OTHER)
Admission: RE | Admit: 2018-08-21 | Discharge: 2018-08-21 | Disposition: A | Discharge status: Home / Self Care | Payer: Medicare Other | Source: Ambulatory Visit | Attending: Internal Medicine | Admitting: Internal Medicine

## 2018-08-21 DIAGNOSIS — Z1231 Encounter for screening mammogram for malignant neoplasm of breast: Secondary | ICD-10-CM | POA: Insufficient documentation

## 2018-08-21 NOTE — Telephone Encounter (Signed)
Copied from [place] [phone]. Topic: Quick Communication - See Telephone Encounter >> Aug 21, 2018 12:01 PM Rosalin Hawking wrote: CRM for notification. See Telephone encounter for: 08/21/18.   Pt dropped off document for provider to see and to have a copy on pt's chart (Legal Document - Living Will) Pt would like to be called when provider already seen and have copy for pt's chart. Please call pt at [phone] when original copy is ready to be picked up. Document put under providers name at front office tray.

## 2018-08-22 NOTE — Telephone Encounter (Signed)
Forwarded to provider to Initial and return for scan/SLS 10/01

## 2018-08-22 NOTE — Telephone Encounter (Signed)
Informed patient that she can come during regular business hours and p/u her original copy of Living Will; pt understood & agreed/SLS 10/01

## 2018-10-04 ENCOUNTER — Other Ambulatory Visit: Payer: Self-pay | Admitting: Internal Medicine

## 2018-12-05 ENCOUNTER — Other Ambulatory Visit: Payer: Self-pay | Admitting: Internal Medicine

## 2019-01-12 NOTE — Progress Notes (Addendum)
Subjective:   Carol Wilson is a 73 y.o. female who presents for Medicare Annual (Subsequent) preventive examination.  Pt enjoys reading. Pt involved in church and small groups.  Review of Systems:No ROS.  Medicare Wellness Visit. Additional risk factors are reflected in the social history. Cardiac Risk Factors include: advanced age (>36men, >74 women);dyslipidemia;hypertension Sleep patterns: Sleeps very well 7-8 hrs taking Xanax when needed. Home Safety/Smoke Alarms: Feels safe in home. Smoke alarms in place.  Lives with husband in 2 story home. Walk-in shower.   Female:        Mammo-  08/21/18     Dexa scan-  08/16/18 [facility]-last 06/22/11 w/ 10 yr recall    Objective:     Vitals: BP 128/80 (BP Location: Left Arm, Patient Position: Sitting, Cuff Size: Normal)   Pulse 63   Ht 5\' 6"  (1.676 m)   Wt 160 lb (72.6 kg)   SpO2 98%   BMI 25.82 kg/m   Body mass index is 25.82 kg/m.  Advanced Directives 01/15/2019 02/17/2018 01/12/2018 01/10/2017  Does Patient Have a Medical Advance Directive? Yes No Yes Yes  Type of Paramedic of [place];Living will - [place];Living will [place];Living will  Does patient want to make changes to medical advance directive? No - Patient declined - - -  Copy of [place] in Chart? Yes - validated most recent copy scanned in chart (See row information) - No - copy requested No - copy requested  Would patient like information on creating a medical advance directive? - No - Patient declined - -    Tobacco Social History   Tobacco Use  Smoking Status Never Smoker  Smokeless Tobacco Never Used     Counseling given: Not Answered   Clinical Intake:     Pain : No/denies pain     Past Medical History:  Diagnosis Date  . Anxiety   . Colon polyps   . Depression    started ~ 2008 after she lost a daughter  . Familial tremor   . HTN (hypertension)    well  controlled with propranolol  . Hyperlipidemia   . Osteopenia   . Rosacea    sees derm   Past Surgical History:  Procedure Laterality Date  . APPENDECTOMY    . lenses implant B 2018 Bilateral 2018  . TUBAL LIGATION     Family History  Problem Relation Age of Onset  . Lung cancer Daughter   . Coronary artery disease Mother        M 58y/o and F 68y/o  . Heart attack Father   . Alcohol abuse Brother   . Tuberculosis Daughter   . Diabetes Neg Hx   . Hypertension Neg Hx   . Stroke Neg Hx   . Colon cancer Neg Hx   . Breast cancer Neg Hx    Social History   Socioeconomic History  . Marital status: Married    Spouse name: Not on file  . Number of children: 2  . Years of education: Not on file  . Highest education level: Not on file  Occupational History  . Occupation: retired-- cooks for the school    Employer: [employer]  . Financial resource strain: Not on file  . Food insecurity:    Worry: Not on file    Inability: Not on file  . Transportation needs:    Medical: Not on file    Non-medical:  Not on file  Tobacco Use  . Smoking status: Never Smoker  . Smokeless tobacco: Never Used  Substance and Sexual Activity  . Alcohol use: Yes    Comment: once a month  . Drug use: No  . Sexual activity: Not Currently  Lifestyle  . Physical activity:    Days per week: Not on file    Minutes per session: Not on file  . Stress: Not on file  Relationships  . Social connections:    Talks on phone: Not on file    Gets together: Not on file    Attends religious service: Not on file    Active member of club or organization: Not on file    Attends meetings of clubs or organizations: Not on file    Relationship status: Not on file  Other Topics Concern  . Not on file  Social History Narrative   Very active--   lost her only daughter to lung cancer ~ 2008, lost a 13 old month baby years ago , no living children, takes care of  his [name], 73 y/o     Outpatient Encounter Medications as of 01/15/2019  Medication Sig  . alendronate (FOSAMAX) 70 MG tablet Take 1 tablet (70 mg total) by mouth once a week. Take with a full glass of water on an empty stomach.  . ALPRAZolam (XANAX) 0.5 MG tablet Take 0.5-1 tablets (0.25-0.5 mg total) by mouth at bedtime as needed for sleep.  Marland Kitchen aspirin 81 MG tablet Take 81 mg by mouth daily.    Marland Kitchen ezetimibe-simvastatin (VYTORIN) 10-40 MG tablet Take 1 tablet by mouth daily.  . fluticasone (FLONASE) 50 MCG/ACT nasal spray Place 2 sprays into both nostrils daily.  Marland Kitchen loperamide (IMODIUM) 2 MG capsule Take 2 mg by mouth daily.  . metroNIDAZOLE (METROGEL) 1 % gel Apply 1 application topically daily.  . primidone (MYSOLINE) 50 MG tablet TAKE ONE-HALF TABLET BY  MOUTH AT BEDTIME  . propranolol (INDERAL) 10 MG tablet Take 1 tablet (10 mg total) by mouth daily.  . sertraline (ZOLOFT) 100 MG tablet Take 1 tablet (100 mg total) by mouth daily.   No facility-administered encounter medications on file as of 01/15/2019.     Activities of Daily Living In your present state of health, do you have any difficulty performing the following activities: 01/15/2019  Hearing? N  Vision? N  Difficulty concentrating or making decisions? N  Walking or climbing stairs? N  Dressing or bathing? N  Doing errands, shopping? N  Preparing Food and eating ? N  Using the Toilet? N  In the past six months, have you accidently leaked urine? N  Do you have problems with loss of bowel control? N  Managing your Medications? N  Managing your Finances? N  Housekeeping or managing your Housekeeping? N  Some recent data might be hidden    Patient Care Team: Colon Branch, MD as PCP - General (Internal Medicine) Tat, Eustace Quail, DO as Consulting Physician (Neurology)    Assessment:   This is a routine wellness examination for Eriyah. Physical assessment deferred to PCP.  Exercise Activities and Dietary recommendations Current Exercise Habits:  Structured exercise class([employer] Type of exercise: walking;strength training/weights, Time (Minutes): 60, Frequency (Times/Week): 3, Weekly Exercise (Minutes/Week): 180, Exercise limited by: None identified   Diet (meal preparation, eat out, water intake, caffeinated beverages, dairy products, fruits and vegetables): well balanced, on average, 3 meals per day Breakfast: spinach and cheese quesidilla, coffee Lunch: sandwich Dinner:  Chicken or fish and vegetables Drinks 6 bottles of water per day.  Goals    . DIET - REDUCE SUGAR INTAKE    . Maintain healthy active lifestyle.       Fall Risk Fall Risk  01/15/2019 04/10/2018 01/12/2018 08/09/2017 01/10/2017  Falls in the past year? 0 No No No No    Depression Screen PHQ 2/9 Scores 01/15/2019 01/12/2018 01/10/2017 08/13/2016  PHQ - 2 Score 0 0 0 0     Cognitive Function Ad8 score reviewed for issues:  Issues making decisions:no  Less interest in hobbies / activities:no  Repeats questions, stories (family complaining):no  Trouble using ordinary gadgets (microwave, computer, phone):no  Forgets the month or year: no  Mismanaging finances: no  Remembering appts:no  Daily problems with thinking and/or memory:no Ad8 score is=0   MMSE - Mini Mental State Exam 01/12/2018 01/10/2017  Orientation to time 5 5  Orientation to Place 5 5  Registration 3 3  Attention/ Calculation 5 5  Recall 3 1  Language- name 2 objects 2 2  Language- repeat 1 1  Language- follow 3 step command 3 3  Language- read & follow direction 1 1  Write a sentence 1 1  Copy design 1 1  Total score 30 28        Immunization History  Administered Date(s) Administered  . H1N1 11/07/2008  . Influenza Whole 08/22/2009  . Influenza, High Dose Seasonal PF 08/16/2013, 08/13/2016, 08/18/2017, 08/16/2018  . Influenza,inj,Quad PF,6+ Mos 08/06/2014, 08/13/2015  . Pneumococcal Conjugate-13 08/06/2014  . Pneumococcal Polysaccharide-23 06/08/2011  . Td 02/22/2007,  08/18/2017  . Zoster 12/15/2009   Screening Tests Health Maintenance  Topic Date Due  . MAMMOGRAM  08/22/2019  . COLONOSCOPY  02/17/2021  . TETANUS/TDAP  08/19/2027  . INFLUENZA VACCINE  Completed  . DEXA SCAN  Completed  . Hepatitis C Screening  Completed  . PNA vac Low Risk Adult  Completed      Plan:   Keep up the great work!  Continue to eat heart healthy diet (full of fruits, vegetables, whole grains, lean protein, water--limit salt, fat, and sugar intake) and increase physical activity as tolerated.   I have personally reviewed and noted the following in the patient's chart:   . Medical and social history . Use of alcohol, tobacco or illicit drugs  . Current medications and supplements . Functional ability and status . Nutritional status . Physical activity . Advanced directives . List of other physicians . Hospitalizations, surgeries, and ER visits in previous 12 months . Vitals . Screenings to include cognitive, depression, and falls . Referrals and appointments  In addition, I have reviewed and discussed with patient certain preventive protocols, quality metrics, and best practice recommendations. A written personalized care plan for preventive services as well as general preventive health recommendations were provided to patient.     Naaman Plummer [place], [place]  01/15/2019   Kathlene November, MD

## 2019-01-15 ENCOUNTER — Encounter: Payer: Self-pay | Admitting: *Deleted

## 2019-01-15 ENCOUNTER — Ambulatory Visit (INDEPENDENT_AMBULATORY_CARE_PROVIDER_SITE_OTHER): Payer: Medicare Other | Admitting: *Deleted

## 2019-01-15 VITALS — BP 128/80 | HR 63 | Ht 66.0 in | Wt 160.0 lb

## 2019-01-15 DIAGNOSIS — Z0000 Encounter for general adult medical examination without abnormal findings: Secondary | ICD-10-CM | POA: Diagnosis not present

## 2019-01-15 NOTE — Patient Instructions (Addendum)
Keep up the great work!  Continue to eat heart healthy diet (full of fruits, vegetables, whole grains, lean protein, water--limit salt, fat, and sugar intake) and increase physical activity as tolerated.    [name] , Thank you for taking time to come for your Medicare Wellness Visit. I appreciate your ongoing commitment to your health goals. Please review the following plan we discussed and let me know if I can assist you in the future.   These are the goals we discussed: Goals    . DIET - REDUCE SUGAR INTAKE    . Maintain healthy active lifestyle.       This is a list of the screening recommended for you and due dates:  Health Maintenance  Topic Date Due  . Mammogram  08/22/2019  . Colon Cancer Screening  02/17/2021  . Tetanus Vaccine  08/19/2027  . Flu Shot  Completed  . DEXA scan (bone density measurement)  Completed  .  Hepatitis C: One time screening is recommended by Center for Disease Control  (CDC) for  adults born from [birth year] through 1965.   Completed  . Pneumonia vaccines  Completed    Health Maintenance After Age 33 After age 58, you are at a higher risk for certain long-term diseases and infections as well as injuries from falls. Falls are a major cause of broken bones and head injuries in people who are older than age 58. Getting regular preventive care can help to keep you healthy and well. Preventive care includes getting regular testing and making lifestyle changes as recommended by your health care provider. Talk with your health care provider about:  Which screenings and tests you should have. A screening is a test that checks for a disease when you have no symptoms.  A diet and exercise plan that is right for you. What should I know about screenings and tests to prevent falls? Screening and testing are the best ways to find a health problem early. Early diagnosis and treatment give you the best chance of managing medical conditions that are common after age  37. Certain conditions and lifestyle choices may make you more likely to have a fall. Your health care provider may recommend:  Regular vision checks. Poor vision and conditions such as cataracts can make you more likely to have a fall. If you wear glasses, make sure to get your prescription updated if your vision changes.  Medicine review. Work with your health care provider to regularly review all of the medicines you are taking, including over-the-counter medicines. Ask your health care provider about any side effects that may make you more likely to have a fall. Tell your health care provider if any medicines that you take make you feel dizzy or sleepy.  Osteoporosis screening. Osteoporosis is a condition that causes the bones to get weaker. This can make the bones weak and cause them to break more easily.  Blood pressure screening. Blood pressure changes and medicines to control blood pressure can make you feel dizzy.  Strength and balance checks. Your health care provider may recommend certain tests to check your strength and balance while standing, walking, or changing positions.  Foot health exam. Foot pain and numbness, as well as not wearing proper footwear, can make you more likely to have a fall.  Depression screening. You may be more likely to have a fall if you have a fear of falling, feel emotionally low, or feel unable to do activities that you used to do.  Alcohol use screening. Using too much alcohol can affect your balance and may make you more likely to have a fall. What actions can I take to lower my risk of falls? General instructions  Talk with your health care provider about your risks for falling. Tell your health care provider if: ? You fall. Be sure to tell your health care provider about all falls, even ones that seem minor. ? You feel dizzy, sleepy, or off-balance.  Take over-the-counter and prescription medicines only as told by your health care provider. These  include any supplements.  Eat a healthy diet and maintain a healthy weight. A healthy diet includes low-fat dairy products, low-fat (lean) meats, and fiber from whole grains, beans, and lots of fruits and vegetables. Home safety  Remove any tripping hazards, such as rugs, cords, and clutter.  Install safety equipment such as grab bars in bathrooms and safety rails on stairs.  Keep rooms and walkways well-lit. Activity   Follow a regular exercise program to stay fit. This will help you maintain your balance. Ask your health care provider what types of exercise are appropriate for you.  If you need a cane or walker, use it as recommended by your health care provider.  Wear supportive shoes that have nonskid soles. Lifestyle  Do not drink alcohol if your health care provider tells you not to drink.  If you drink alcohol, limit how much you have: ? 0-1 drink a day for women. ? 0-2 drinks a day for men.  Be aware of how much alcohol is in your drink. In the [place] one drink equals one typical bottle of beer (12 oz), one-half glass of wine (5 oz), or one shot of hard liquor (1 oz).  Do not use any products that contain nicotine or tobacco, such as cigarettes and e-cigarettes. If you need help quitting, ask your health care provider. Summary  Having a healthy lifestyle and getting preventive care can help to protect your health and wellness after age 61.  Screening and testing are the best way to find a health problem early and help you avoid having a fall. Early diagnosis and treatment give you the best chance for managing medical conditions that are more common for people who are older than age 12.  Falls are a major cause of broken bones and head injuries in people who are older than age 47. Take precautions to prevent a fall at home.  Work with your health care provider to learn what changes you can make to improve your health and wellness and to prevent falls. This information is  not intended to replace advice given to you by your health care provider. Make sure you discuss any questions you have with your health care provider. Document Released: 09/21/2017 Document Revised: 09/21/2017 Document Reviewed: 09/21/2017 Elsevier Interactive Patient Education  2019 [employer].

## 2019-02-09 ENCOUNTER — Other Ambulatory Visit: Payer: Self-pay | Admitting: Internal Medicine

## 2019-02-14 ENCOUNTER — Telehealth (INDEPENDENT_AMBULATORY_CARE_PROVIDER_SITE_OTHER): Payer: Medicare Other | Admitting: Internal Medicine

## 2019-02-14 DIAGNOSIS — E785 Hyperlipidemia, unspecified: Secondary | ICD-10-CM

## 2019-02-14 DIAGNOSIS — K529 Noninfective gastroenteritis and colitis, unspecified: Secondary | ICD-10-CM | POA: Diagnosis not present

## 2019-02-14 DIAGNOSIS — G250 Essential tremor: Secondary | ICD-10-CM

## 2019-02-14 DIAGNOSIS — Z09 Encounter for follow-up examination after completed treatment for conditions other than malignant neoplasm: Secondary | ICD-10-CM

## 2019-02-14 NOTE — Progress Notes (Signed)
Virtual Visit via Telephone Note  I connected with Carol Wilson on 02/14/19 at 10:00 AM EDT by telephone and verified that I am speaking with the correct person using two identifiers.   I discussed the limitations, risks, security and privacy concerns of performing an evaluation and management service by telephone and the availability of in person appointments. I also discussed with the patient that there may be a patient responsible charge related to this service. The patient expressed understanding and agreed to proceed.   History of Present Illness: Routine follow-up We went over her medications and labs. She has no major concerns   ROS: No fever chills No nausea, vomiting, diarrhea.  No blood in the stools. No cough. She is blood donor, last donation was 11-2018.  Observations/Objective: She sounded very good on the phone  Assessment and Plan:  Assessment Hyperlipidemia  Depression , insomnia --onset 2008, lost a daughter Neurology: -Essential tremor- Dr Tat -Hyper reflexion Osteopenia dexa: 2012, 2014, 01/14/2016: T score -0.7. Rosacea   Sees dermatology Allergic rhinitis  PLAN: Hyperlipidemia: Currently on Vytorin, last FLP satisfactory.  No change depression, insomnia: Currently well controlled on sertraline and Xanax. Essential tremor: Symptoms are about the same but they are not interfering with her ADLs.  Continue primidone and propanolol, follow-up by neurology. Diarrhea: Much improved, well controlled with Imodium daily. Preventive care: Interested on Shingrix, we talk about that option, side effects and benefits.  She would like to proceed once the coronavirus pandemic is over. RTC 4 to 5 months CPX.   I discussed the assessment and treatment plan with the patient. The patient was provided an opportunity to ask questions and all were answered. The patient agreed with the plan and demonstrated an understanding of the instructions.   The patient was advised to  call back or seek an in-person evaluation if the symptoms worsen or if the condition fails to improve as anticipated.  I provided 12  minutes of non-face-to-face time during this encounter.   Kathlene November, MD

## 2019-02-15 NOTE — Assessment & Plan Note (Signed)
Hyperlipidemia: Currently on Vytorin, last FLP satisfactory.  No change depression, insomnia: Currently well controlled on sertraline and Xanax. Essential tremor: Symptoms are about the same but they are not interfering with her ADLs.  Continue primidone and propanolol, follow-up by neurology. Diarrhea: Much improved, well controlled with Imodium daily. Preventive care: Interested on Shingrix, we talk about that option, side effects and benefits.  She would like to proceed once the coronavirus pandemic is over. RTC 4 to 5 months CPX.

## 2019-04-18 ENCOUNTER — Other Ambulatory Visit: Payer: Self-pay | Admitting: Internal Medicine

## 2019-04-25 ENCOUNTER — Other Ambulatory Visit: Payer: Self-pay | Admitting: Neurology

## 2019-04-26 NOTE — Telephone Encounter (Signed)
Requested Prescriptions   Pending Prescriptions Disp Refills  . primidone (MYSOLINE) 50 MG tablet [Pharmacy Med Name: PRIMIDONE  50MG   TAB] 45 tablet 3    Sig: TAKE ONE-HALF TABLET BY  MOUTH AT BEDTIME   Rx last filled:06/23/18 #45 3 refills  Pt last seen: 04/10/18 ASSESSMENT/PLAN:  1.  Essential Tremor.             -pt is much improved on primidone and is only on 25 mg daily and will continue on that.  Follow up appt scheduled:none DENIED REQUEST TO SOON

## 2019-05-11 ENCOUNTER — Other Ambulatory Visit: Payer: Self-pay | Admitting: Neurology

## 2019-05-14 NOTE — Telephone Encounter (Signed)
Requested Prescriptions   Pending Prescriptions Disp Refills  . primidone (MYSOLINE) 50 MG tablet [Pharmacy Med Name: PRIMIDONE  50MG   TAB] 45 tablet 3    Sig: TAKE ONE-HALF TABLET BY  MOUTH AT BEDTIME   Rx last filled: 06/23/18 #45 3 refills  Pt last seen: 04/10/18   Follow up appt scheduled:none

## 2019-05-15 MED ORDER — PRIMIDONE 50 MG PO TABS: 25.0000 mg | ORAL_TABLET | Freq: Every day | ORAL | 3 refills | Status: DC

## 2019-05-15 NOTE — Addendum Note (Signed)
Addended by: Ranae Plumber on: 05/15/2019 04:29 PM   Modules accepted: Orders

## 2019-05-15 NOTE — Telephone Encounter (Signed)
Rx request again today Ok to send place on HOLD until due

## 2019-05-21 IMAGING — MG 2D DIGITAL SCREENING BILATERAL MAMMOGRAM WITH CAD AND ADJUNCT TO
9 series · 9 of 25 positions shown · non-contrast
Comparison: Previous exam(s).

CLINICAL DATA: Screening.

EXAM:
2D DIGITAL SCREENING BILATERAL MAMMOGRAM WITH CAD AND ADJUNCT TOMO

[L MLO (1 of 2)]
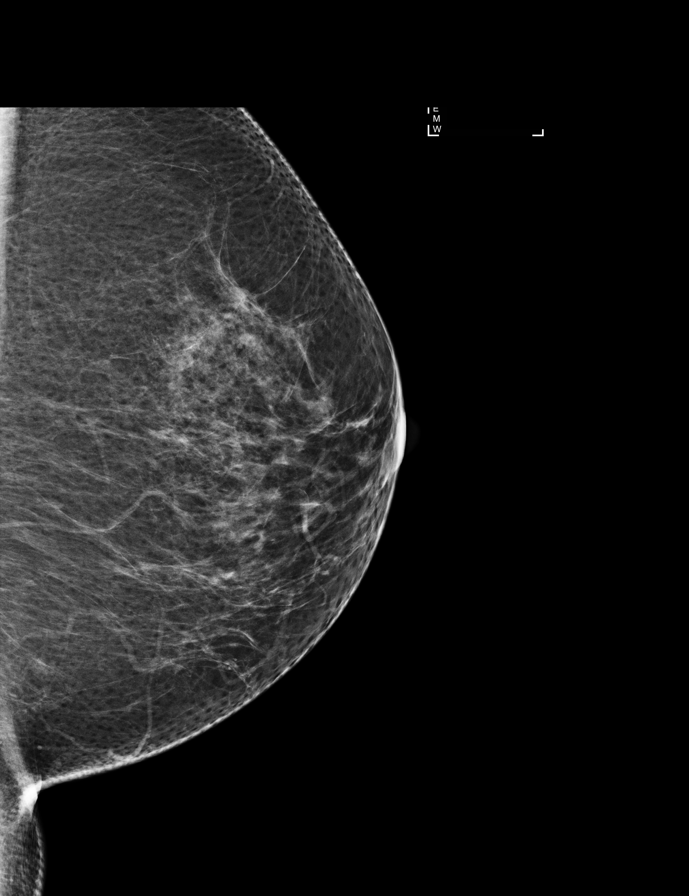

[L MLO (2 of 2)]
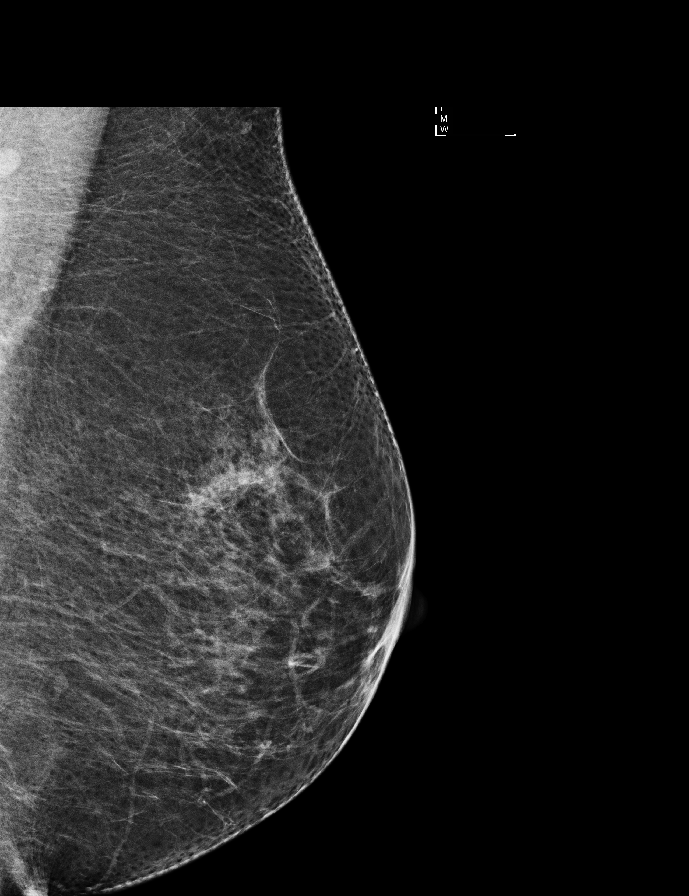

[R CC]
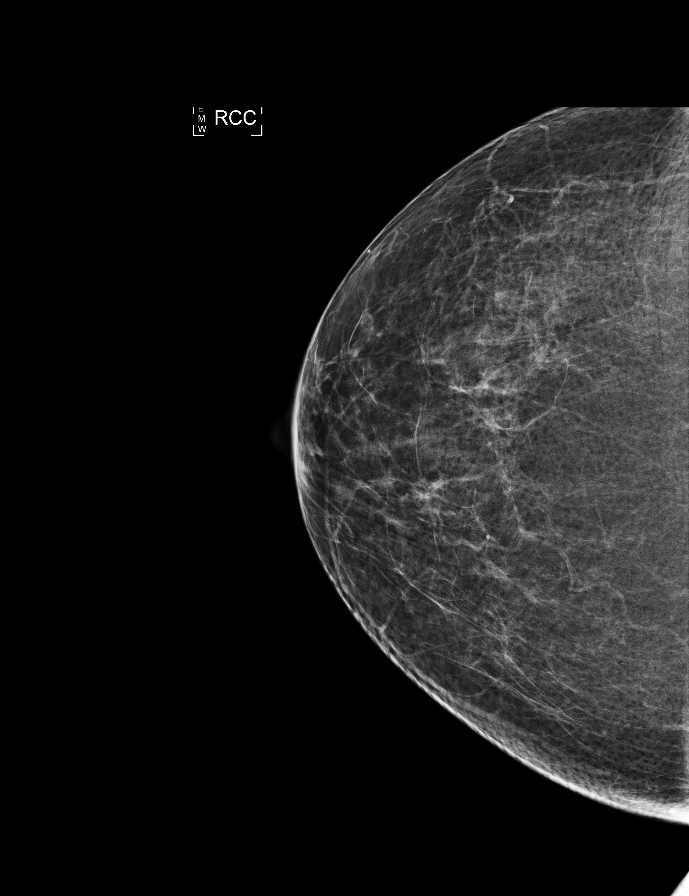

[L CC]
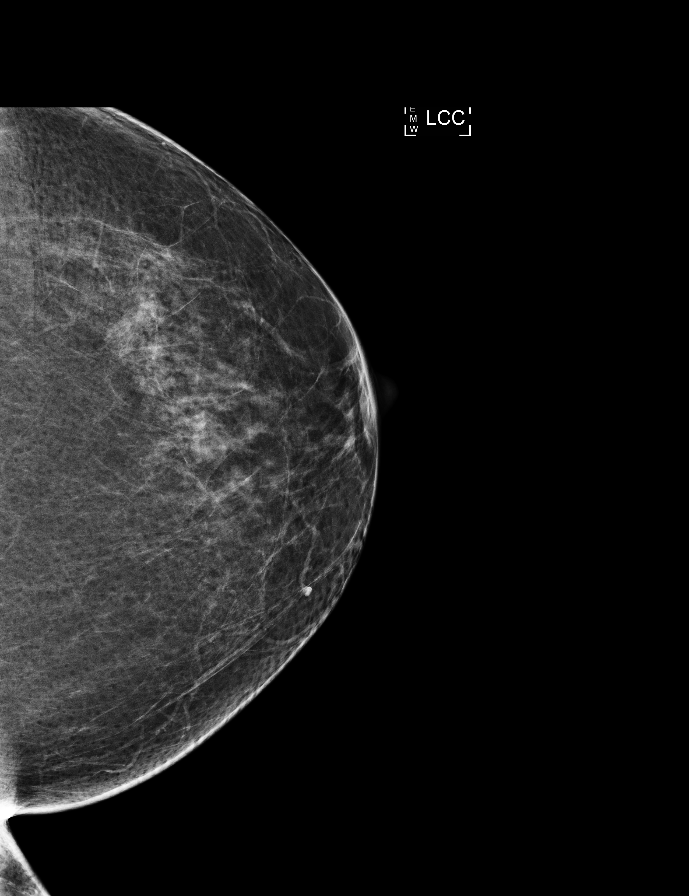

[R MLO]
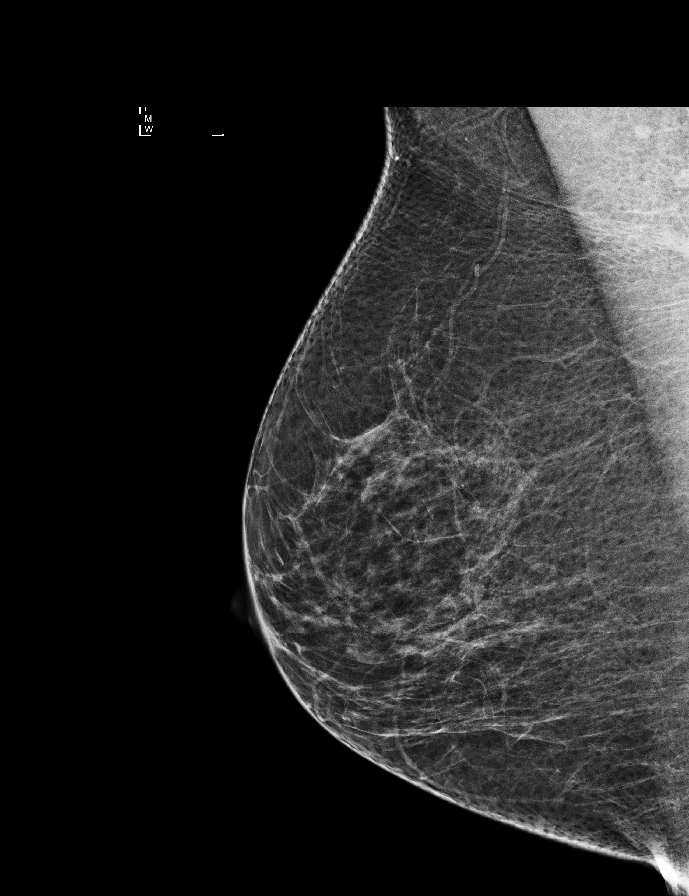

[R CC tomo · tomo slice 33/66.0]
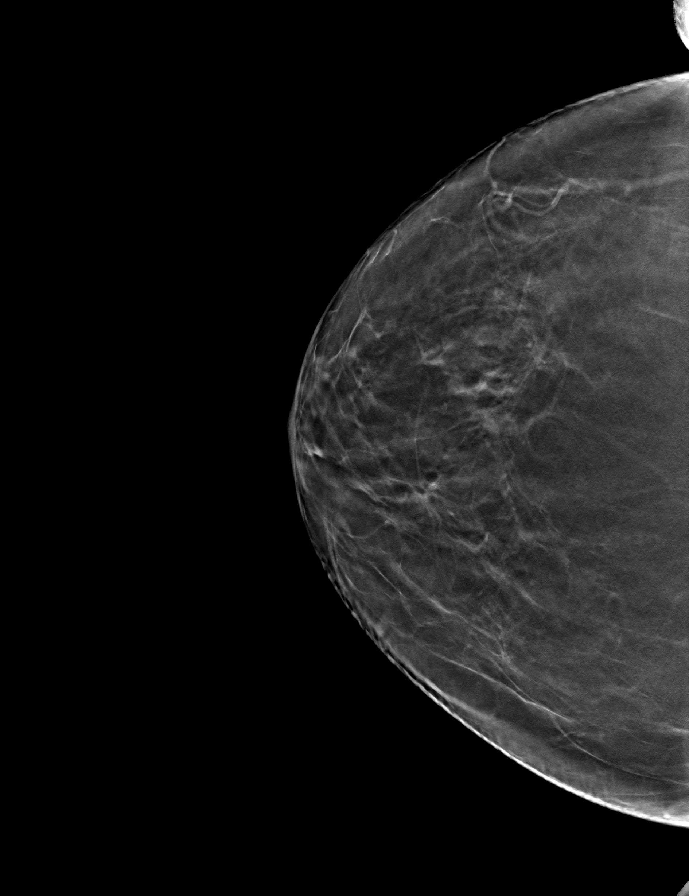

[R MLO tomo · tomo slice 33/65.0]
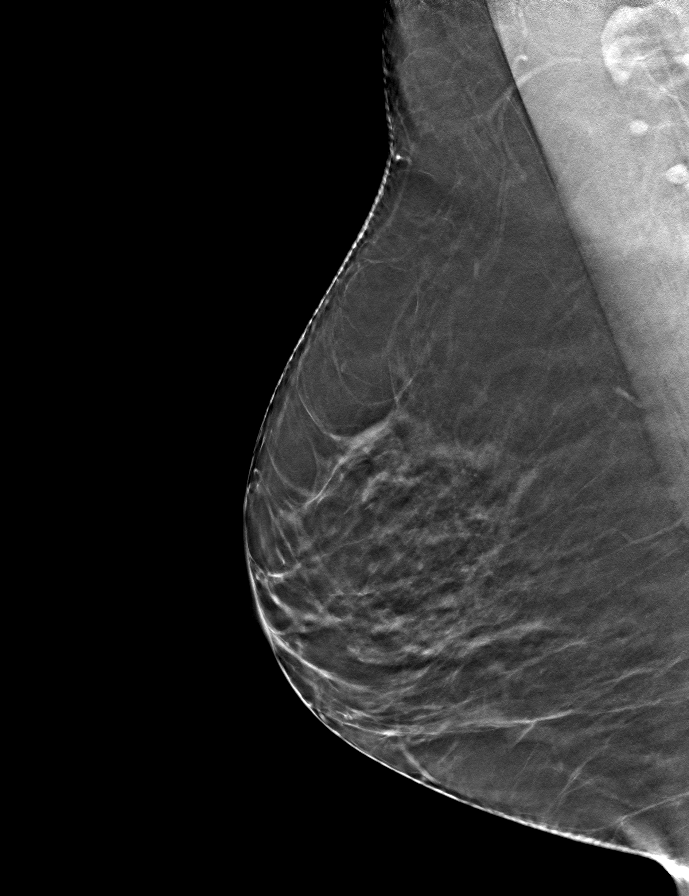

[L CC tomo · tomo slice 36/71.0]
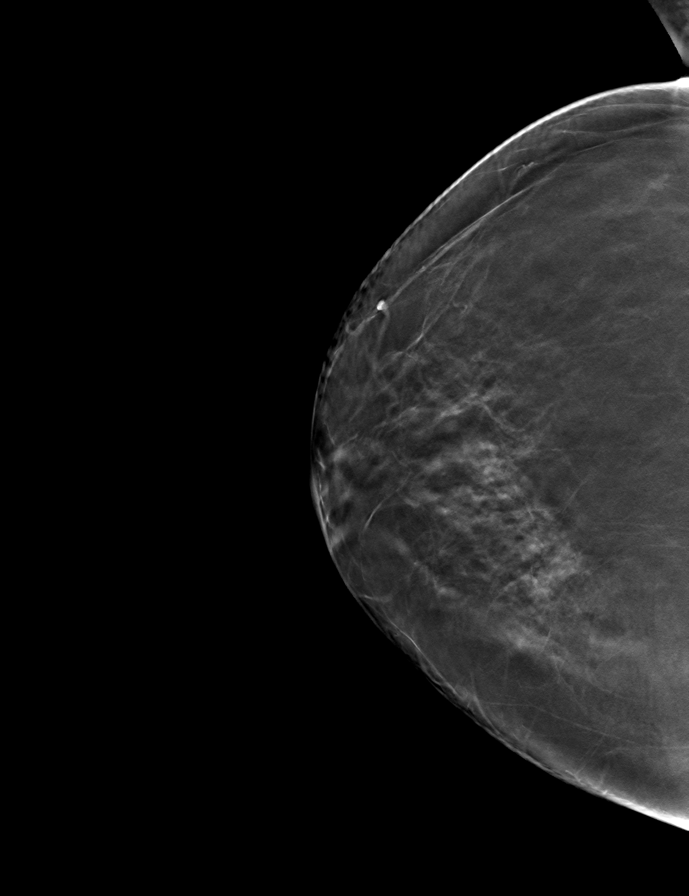

[L MLO tomo · tomo slice 34/67.0]
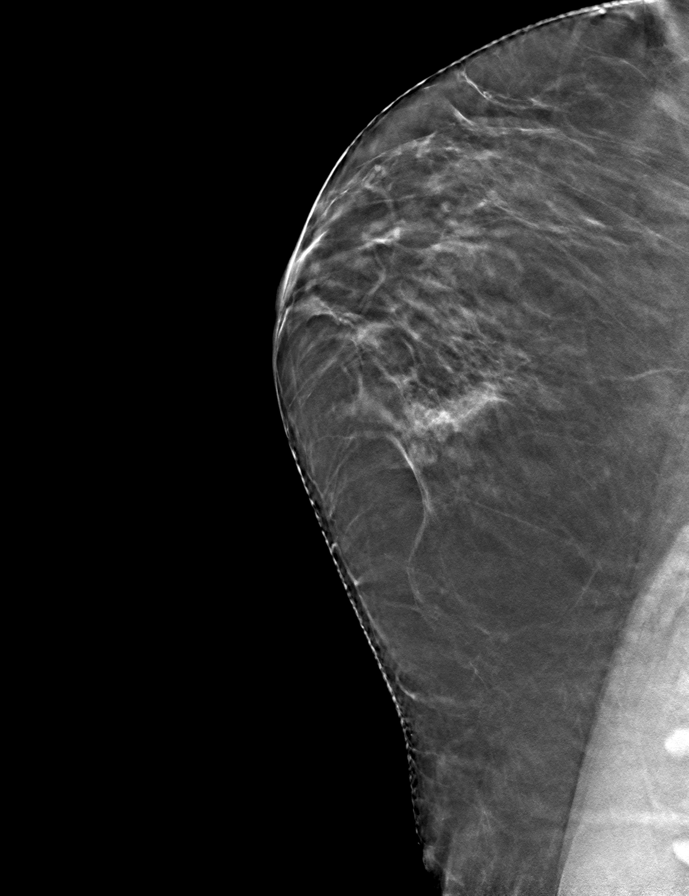

[9 of 25 positions shown; findings below may reference images not displayed]

ACR Breast Density Category b: There are scattered areas of
fibroglandular density.
FINDINGS: There are no findings suspicious for malignancy. Images were
processed with CAD.
IMPRESSION: No mammographic evidence of malignancy. A result letter of this
screening mammogram will be mailed directly to the patient.

RECOMMENDATION:
Screening mammogram in one year. (Code:97-6-RS4)

BI-RADS CATEGORY  1: Negative.

## 2019-07-29 ENCOUNTER — Other Ambulatory Visit: Payer: Self-pay | Admitting: Internal Medicine

## 2019-09-09 ENCOUNTER — Other Ambulatory Visit: Payer: Self-pay | Admitting: Internal Medicine

## 2019-09-25 ENCOUNTER — Other Ambulatory Visit (HOSPITAL_BASED_OUTPATIENT_CLINIC_OR_DEPARTMENT_OTHER): Payer: Self-pay | Admitting: Internal Medicine

## 2019-09-25 DIAGNOSIS — Z1231 Encounter for screening mammogram for malignant neoplasm of breast: Secondary | ICD-10-CM

## 2019-09-27 ENCOUNTER — Other Ambulatory Visit: Payer: Self-pay

## 2019-09-27 ENCOUNTER — Ambulatory Visit (HOSPITAL_BASED_OUTPATIENT_CLINIC_OR_DEPARTMENT_OTHER)
Admission: RE | Admit: 2019-09-27 | Discharge: 2019-09-27 | Disposition: A | Discharge status: Home / Self Care | Payer: Medicare Other | Source: Ambulatory Visit | Attending: Internal Medicine | Admitting: Internal Medicine

## 2019-09-27 DIAGNOSIS — Z1231 Encounter for screening mammogram for malignant neoplasm of breast: Secondary | ICD-10-CM | POA: Diagnosis not present

## 2019-10-01 ENCOUNTER — Other Ambulatory Visit: Payer: Self-pay | Admitting: Internal Medicine

## 2019-10-18 ENCOUNTER — Other Ambulatory Visit: Payer: Self-pay | Admitting: Internal Medicine

## 2019-11-03 ENCOUNTER — Other Ambulatory Visit: Payer: Self-pay | Admitting: Internal Medicine

## 2019-11-29 ENCOUNTER — Other Ambulatory Visit: Payer: Self-pay | Admitting: Internal Medicine

## 2019-12-21 ENCOUNTER — Other Ambulatory Visit: Payer: Self-pay | Admitting: Internal Medicine

## 2019-12-26 ENCOUNTER — Other Ambulatory Visit: Payer: Self-pay | Admitting: Internal Medicine

## 2020-01-16 NOTE — Progress Notes (Signed)
Virtual Visit via Audio Note  I connected with patient on 01/17/20 at 11:00 AM EST by audio enabled telemedicine application and verified that I am speaking with the correct person using two identifiers.   THIS ENCOUNTER IS A VIRTUAL VISIT DUE TO COVID-19 - PATIENT WAS NOT SEEN IN THE OFFICE. PATIENT HAS CONSENTED TO VIRTUAL VISIT / TELEMEDICINE VISIT   Location of patient: home  Location of provider: office  I discussed the limitations of evaluation and management by telemedicine and the availability of in person appointments. The patient expressed understanding and agreed to proceed.   Subjective:   Carol Wilson is a 74 y.o. female who presents for Medicare Annual (Subsequent) preventive examination.  Pt enjoys reading. Pt is involved in her church and small groups.   Review of Systems:  Home Safety/Smoke Alarms: Feels safe in home. Smoke alarms in place.  Lives w/ husband in 2 story home. Walk-in shower.    Female:      Mammo-10/01/19       Dexa scan- 08/16/18      CCS-02/17/18. Pt reports 10 yr recall     Objective:     Vitals: Unable to assess. This visit is enabled though telemedicine due to Covid 19.   Advanced Directives 01/17/2020 01/15/2019 02/17/2018 01/12/2018 01/10/2017  Does Patient Have a Medical Advance Directive? Yes Yes No Yes Yes  Type of Paramedic of [place];Living will [place];Living will - [place];Living will [place];Living will  Does patient want to make changes to medical advance directive? No - Patient declined No - Patient declined - - -  Copy of [place] in Chart? Yes - validated most recent copy scanned in chart (See row information) Yes - validated most recent copy scanned in chart (See row information) - No - copy requested No - copy requested  Would patient like information on creating a medical advance directive? - - No - Patient declined -  -    Tobacco Social History   Tobacco Use  Smoking Status Never Smoker  Smokeless Tobacco Never Used     Counseling given: Not Answered   Clinical Intake:     Pain : No/denies pain                 Past Medical History:  Diagnosis Date  . Anxiety   . Colon polyps   . Depression    started ~ 2008 after she lost a daughter  . Familial tremor   . HTN (hypertension)    well controlled with propranolol  . Hyperlipidemia   . Osteopenia   . Rosacea    sees derm   Past Surgical History:  Procedure Laterality Date  . APPENDECTOMY    . lenses implant B 2018 Bilateral 2018  . TUBAL LIGATION     Family History  Problem Relation Age of Onset  . Lung cancer Daughter   . Coronary artery disease Mother        M 58y/o and F 68y/o  . Heart attack Father   . Alcohol abuse Brother   . Tuberculosis Daughter   . Diabetes Neg Hx   . Hypertension Neg Hx   . Stroke Neg Hx   . Colon cancer Neg Hx   . Breast cancer Neg Hx    Social History   Socioeconomic History  . Marital status: Married    Spouse name: Not on file  . Number of children: 2  .  Years of education: Not on file  . Highest education level: Not on file  Occupational History  . Occupation: retired-- cooks for the school    Employer: [employer]  Tobacco Use  . Smoking status: Never Smoker  . Smokeless tobacco: Never Used  Substance and Sexual Activity  . Alcohol use: Yes    Comment: once a month  . Drug use: No  . Sexual activity: Not Currently  Other Topics Concern  . Not on file  Social History Narrative   Very active--   lost her only daughter to lung cancer ~ 2008, lost a 31 old month baby years ago , no living children, takes care of  his [name], 74 y/o   Social Determinants of Radio broadcast assistant Strain: Low Risk   . Difficulty of Paying Living Expenses: Not hard at all  Food Insecurity: No Food Insecurity  . Worried About Charity fundraiser in the Last Year: Never true   . Ran Out of Food in the Last Year: Never true  Transportation Needs: No Transportation Needs  . Lack of Transportation (Medical): No  . Lack of Transportation (Non-Medical): No  Physical Activity:   . Days of Exercise per Week: Not on file  . Minutes of Exercise per Session: Not on file  Stress:   . Feeling of Stress : Not on file  Social Connections:   . Frequency of Communication with Friends and Family: Not on file  . Frequency of Social Gatherings with Friends and Family: Not on file  . Attends Religious Services: Not on file  . Active Member of Clubs or Organizations: Not on file  . Attends Archivist Meetings: Not on file  . Marital Status: Not on file    Outpatient Encounter Medications as of 01/17/2020  Medication Sig  . ezetimibe-simvastatin (VYTORIN) 10-40 MG tablet Take 1 tablet by mouth daily.  . primidone (MYSOLINE) 50 MG tablet Take 0.5 tablets (25 mg total) by mouth at bedtime.  . propranolol (INDERAL) 10 MG tablet TAKE 1 TABLET BY MOUTH  DAILY  . sertraline (ZOLOFT) 100 MG tablet Take 1 tablet (100 mg total) by mouth daily.  Marland Kitchen ALPRAZolam (XANAX) 0.5 MG tablet Take 0.5-1 tablets (0.25-0.5 mg total) by mouth at bedtime as needed for sleep. (Patient not taking: Reported on 01/17/2020)  . fluticasone (FLONASE) 50 MCG/ACT nasal spray Place 2 sprays into both nostrils daily. (Patient not taking: Reported on 01/17/2020)  . loperamide (IMODIUM) 2 MG capsule Take 2 mg by mouth daily.  . metroNIDAZOLE (METROGEL) 1 % gel Apply 1 application topically daily. (Patient not taking: Reported on 01/17/2020)  . [DISCONTINUED] alendronate (FOSAMAX) 70 MG tablet Take 1 tablet (70 mg total) by mouth once a week. Take with a full glass of water on an empty stomach.  . [DISCONTINUED] aspirin 81 MG tablet Take 81 mg by mouth daily.     No facility-administered encounter medications on file as of 01/17/2020.    Activities of Daily Living In your present state of health, do you have  any difficulty performing the following activities: 01/17/2020  Hearing? N  Vision? N  Difficulty concentrating or making decisions? N  Walking or climbing stairs? N  Dressing or bathing? N  Doing errands, shopping? N  Preparing Food and eating ? N  Using the Toilet? N  In the past six months, have you accidently leaked urine? N  Do you have problems with loss of bowel control? N  Managing your Medications?  N  Managing your Finances? N  Housekeeping or managing your Housekeeping? N  Some recent data might be hidden    Patient Care Team: Colon Branch, MD as PCP - General (Internal Medicine) Tat, Eustace Quail, DO as Consulting Physician (Neurology)    Assessment:   This is a routine wellness examination for [place]. Physical assessment deferred to PCP.   Exercise Activities and Dietary recommendations Current Exercise Habits: Home exercise routine, Type of exercise: strength training/weights;walking, Time (Minutes): 45, Frequency (Times/Week): 3, Weekly Exercise (Minutes/Week): 135, Exercise limited by: None identified Diet (meal preparation, eat out, water intake, caffeinated beverages, dairy products, fruits and vegetables): well balanced   Goals    . DIET - REDUCE SUGAR INTAKE    . Maintain healthy active lifestyle.       Fall Risk Fall Risk  01/17/2020 01/15/2019 04/10/2018 01/12/2018 08/09/2017  Falls in the past year? 0 0 No No No  Number falls in past yr: 0 - - - -  Injury with Fall? 0 - - - -  Follow up Education provided;Falls prevention discussed - - - -     Depression Screen PHQ 2/9 Scores 01/17/2020 01/15/2019 01/12/2018 01/10/2017  PHQ - 2 Score 0 0 0 0     Cognitive Function Ad8 score reviewed for issues:  Issues making decisions:no  Less interest in hobbies / activities:no  Repeats questions, stories (family complaining):no  Trouble using ordinary gadgets (microwave, computer, phone):no  Forgets the month or year: no  Mismanaging finances: no  Remembering  appts:no  Daily problems with thinking and/or memory:no Ad8 score is=0     MMSE - Mini Mental State Exam 01/12/2018 01/10/2017  Orientation to time 5 5  Orientation to Place 5 5  Registration 3 3  Attention/ Calculation 5 5  Recall 3 1  Language- name 2 objects 2 2  Language- repeat 1 1  Language- follow 3 step command 3 3  Language- read & follow direction 1 1  Write a sentence 1 1  Copy design 1 1  Total score 30 28        Immunization History  Administered Date(s) Administered  . H1N1 11/07/2008  . Influenza Whole 08/22/2009  . Influenza, High Dose Seasonal PF 08/16/2013, 08/13/2016, 08/18/2017, 08/16/2018  . Influenza,inj,Quad PF,6+ Mos 08/06/2014, 08/13/2015  . Pneumococcal Conjugate-13 08/06/2014  . Pneumococcal Polysaccharide-23 06/08/2011  . Td 02/22/2007, 08/18/2017  . Zoster 12/15/2009    Screening Tests Health Maintenance  Topic Date Due  . INFLUENZA VACCINE  06/23/2019  . MAMMOGRAM  09/26/2020  . COLONOSCOPY  02/17/2021  . TETANUS/TDAP  08/19/2027  . DEXA SCAN  Completed  . Hepatitis C Screening  Completed  . PNA vac Low Risk Adult  Completed     Plan:   See you next year!  Continue to eat heart healthy diet (full of fruits, vegetables, whole grains, lean protein, water--limit salt, fat, and sugar intake) and increase physical activity as tolerated.  Continue doing brain stimulating activities (puzzles, reading, adult coloring books, staying active) to keep memory sharp.     I have personally reviewed and noted the following in the patient's chart:   . Medical and social history . Use of alcohol, tobacco or illicit drugs  . Current medications and supplements . Functional ability and status . Nutritional status . Physical activity . Advanced directives . List of other physicians . Hospitalizations, surgeries, and ER visits in previous 12 months . Vitals . Screenings to include cognitive, depression, and falls . Referrals  and appointments   In addition, I have reviewed and discussed with patient certain preventive protocols, quality metrics, and best practice recommendations. A written personalized care plan for preventive services as well as general preventive health recommendations were provided to patient.     Naaman Plummer [place], [place]  01/17/2020

## 2020-01-17 ENCOUNTER — Encounter: Payer: Self-pay | Admitting: *Deleted

## 2020-01-17 ENCOUNTER — Ambulatory Visit (INDEPENDENT_AMBULATORY_CARE_PROVIDER_SITE_OTHER): Payer: Medicare PPO | Admitting: *Deleted

## 2020-01-17 ENCOUNTER — Other Ambulatory Visit: Payer: Self-pay

## 2020-01-17 DIAGNOSIS — Z0000 Encounter for general adult medical examination without abnormal findings: Secondary | ICD-10-CM

## 2020-01-17 NOTE — Patient Instructions (Signed)
See you next year!  Continue to eat heart healthy diet (full of fruits, vegetables, whole grains, lean protein, water--limit salt, fat, and sugar intake) and increase physical activity as tolerated.  Continue doing brain stimulating activities (puzzles, reading, adult coloring books, staying active) to keep memory sharp.    Carol Wilson , Thank you for taking time to come for your Medicare Wellness Visit. I appreciate your ongoing commitment to your health goals. Please review the following plan we discussed and let me know if I can assist you in the future.   These are the goals we discussed: Goals    . DIET - REDUCE SUGAR INTAKE    . Maintain healthy active lifestyle.       This is a list of the screening recommended for you and due dates:  Health Maintenance  Topic Date Due  . Flu Shot  06/23/2019  . Mammogram  09/26/2020  . Colon Cancer Screening  02/17/2021  . Tetanus Vaccine  08/19/2027  . DEXA scan (bone density measurement)  Completed  .  Hepatitis C: One time screening is recommended by Center for Disease Control  (CDC) for  adults born from [birth year] through 1965.   Completed  . Pneumonia vaccines  Completed    Preventive Care 46 Years and Older, Female Preventive care refers to lifestyle choices and visits with your health care provider that can promote health and wellness. This includes:  A yearly physical exam. This is also called an annual well check.  Regular dental and eye exams.  Immunizations.  Screening for certain conditions.  Healthy lifestyle choices, such as diet and exercise. What can I expect for my preventive care visit? Physical exam Your health care provider will check:  Height and weight. These may be used to calculate body mass index (BMI), which is a measurement that tells if you are at a healthy weight.  Heart rate and blood pressure.  Your skin for abnormal spots. Counseling Your health care provider may ask you questions about:   Alcohol, tobacco, and drug use.  Emotional well-being.  Home and relationship well-being.  Sexual activity.  Eating habits.  History of falls.  Memory and ability to understand (cognition).  Work and work Statistician.  Pregnancy and menstrual history. What immunizations do I need?  Influenza (flu) vaccine  This is recommended every year. Tetanus, diphtheria, and pertussis (Tdap) vaccine  You may need a Td booster every 10 years. Varicella (chickenpox) vaccine  You may need this vaccine if you have not already been vaccinated. Zoster (shingles) vaccine  You may need this after age 28. Pneumococcal conjugate (PCV13) vaccine  One dose is recommended after age 80. Pneumococcal polysaccharide (PPSV23) vaccine  One dose is recommended after age 85. Measles, mumps, and rubella (MMR) vaccine  You may need at least one dose of MMR if you were born in 1957 or later. You may also need a second dose. Meningococcal conjugate (MenACWY) vaccine  You may need this if you have certain conditions. Hepatitis A vaccine  You may need this if you have certain conditions or if you travel or work in places where you may be exposed to hepatitis A. Hepatitis B vaccine  You may need this if you have certain conditions or if you travel or work in places where you may be exposed to hepatitis B. Haemophilus influenzae type b (Hib) vaccine  You may need this if you have certain conditions. You may receive vaccines as individual doses or as more than one  vaccine together in one shot (combination vaccines). Talk with your health care provider about the risks and benefits of combination vaccines. What tests do I need? Blood tests  Lipid and cholesterol levels. These may be checked every 5 years, or more frequently depending on your overall health.  Hepatitis C test.  Hepatitis B test. Screening  Lung cancer screening. You may have this screening every year starting at age 2 if you have  a 30-pack-year history of smoking and currently smoke or have quit within the past 15 years.  Colorectal cancer screening. All adults should have this screening starting at age 72 and continuing until age 71. Your health care provider may recommend screening at age 57 if you are at increased risk. You will have tests every 1-10 years, depending on your results and the type of screening test.  Diabetes screening. This is done by checking your blood sugar (glucose) after you have not eaten for a while (fasting). You may have this done every 1-3 years.  Mammogram. This may be done every 1-2 years. Talk with your health care provider about how often you should have regular mammograms.  BRCA-related cancer screening. This may be done if you have a family history of breast, ovarian, tubal, or peritoneal cancers. Other tests  Sexually transmitted disease (STD) testing.  Bone density scan. This is done to screen for osteoporosis. You may have this done starting at age 17. Follow these instructions at home: Eating and drinking  Eat a diet that includes fresh fruits and vegetables, whole grains, lean protein, and low-fat dairy products. Limit your intake of foods with high amounts of sugar, saturated fats, and salt.  Take vitamin and mineral supplements as recommended by your health care provider.  Do not drink alcohol if your health care provider tells you not to drink.  If you drink alcohol: ? Limit how much you have to 0-1 drink a day. ? Be aware of how much alcohol is in your drink. In the [place] one drink equals one 12 oz bottle of beer (355 mL), one 5 oz glass of wine (148 mL), or one 1 oz glass of hard liquor (44 mL). Lifestyle  Take daily care of your teeth and gums.  Stay active. Exercise for at least 30 minutes on 5 or more days each week.  Do not use any products that contain nicotine or tobacco, such as cigarettes, e-cigarettes, and chewing tobacco. If you need help quitting, ask  your health care provider.  If you are sexually active, practice safe sex. Use a condom or other form of protection in order to prevent STIs (sexually transmitted infections).  Talk with your health care provider about taking a low-dose aspirin or statin. What's next?  Go to your health care provider once a year for a well check visit.  Ask your health care provider how often you should have your eyes and teeth checked.  Stay up to date on all vaccines. This information is not intended to replace advice given to you by your health care provider. Make sure you discuss any questions you have with your health care provider. Document Revised: 11/02/2018 Document Reviewed: 11/02/2018 Elsevier Patient Education  2020 [employer].

## 2020-01-23 ENCOUNTER — Ambulatory Visit: Payer: Medicare PPO | Admitting: Internal Medicine

## 2020-02-18 ENCOUNTER — Other Ambulatory Visit: Payer: Self-pay

## 2020-02-19 ENCOUNTER — Encounter: Payer: Self-pay | Admitting: Internal Medicine

## 2020-02-19 ENCOUNTER — Other Ambulatory Visit: Payer: Self-pay

## 2020-02-19 ENCOUNTER — Ambulatory Visit (INDEPENDENT_AMBULATORY_CARE_PROVIDER_SITE_OTHER): Payer: Medicare PPO | Admitting: Internal Medicine

## 2020-02-19 VITALS — BP 134/72 | HR 58 | Temp 97.5°F | Resp 16 | Ht 66.0 in | Wt 158.2 lb

## 2020-02-19 DIAGNOSIS — Z0000 Encounter for general adult medical examination without abnormal findings: Secondary | ICD-10-CM | POA: Diagnosis not present

## 2020-02-19 DIAGNOSIS — E785 Hyperlipidemia, unspecified: Secondary | ICD-10-CM

## 2020-02-19 LAB — LIPID PANEL
Cholesterol: 166 mg/dL (ref 0–200)
HDL: 53.4 mg/dL (ref >39.00)
LDL Cholesterol: 89 mg/dL (ref 0–99)
NonHDL: 113.08
Total CHOL/HDL Ratio: 3
Triglycerides: 119 mg/dL (ref 0.0–149.0)
VLDL: 23.8 mg/dL (ref 0.0–40.0)

## 2020-02-19 LAB — CBC WITH DIFFERENTIAL/PLATELET
Basophils Absolute: 0.1 10*3/uL (ref 0.0–0.1)
Basophils Relative: 0.9 % (ref 0.0–3.0)
Eosinophils Absolute: 0.1 10*3/uL (ref 0.0–0.7)
Eosinophils Relative: 2.6 % (ref 0.0–5.0)
HCT: 35.8 % — ABNORMAL LOW (ref 36.0–46.0)
Hemoglobin: 12.1 g/dL (ref 12.0–15.0)
Lymphocytes Relative: 29.7 % (ref 12.0–46.0)
Lymphs Abs: 1.7 10*3/uL (ref 0.7–4.0)
MCHC: 34 g/dL (ref 30.0–36.0)
MCV: 89 fl (ref 78.0–100.0)
Monocytes Absolute: 0.5 10*3/uL (ref 0.1–1.0)
Monocytes Relative: 8 % (ref 3.0–12.0)
Neutro Abs: 3.3 10*3/uL (ref 1.4–7.7)
Neutrophils Relative %: 58.8 % (ref 43.0–77.0)
Platelets: 284 10*3/uL (ref 150.0–400.0)
RBC: 4.02 Mil/uL (ref 3.87–5.11)
RDW: 13.1 % (ref 11.5–15.5)
WBC: 5.7 10*3/uL (ref 4.0–10.5)

## 2020-02-19 LAB — COMPREHENSIVE METABOLIC PANEL
ALT: 15 U/L (ref 0–35)
AST: 22 U/L (ref 0–37)
Albumin: 4.3 g/dL (ref 3.5–5.2)
Alkaline Phosphatase: 99 U/L (ref 39–117)
BUN: 12 mg/dL (ref 6–23)
CO2: 31 mEq/L (ref 19–32)
Calcium: 10 mg/dL (ref 8.4–10.5)
Chloride: 103 mEq/L (ref 96–112)
Creatinine, Ser: 0.8 mg/dL (ref 0.40–1.20)
GFR: 70.14 mL/min (ref >60.00)
Glucose, Bld: 87 mg/dL (ref 70–99)
Potassium: 4.6 mEq/L (ref 3.5–5.1)
Sodium: 136 mEq/L (ref 135–145)
Total Bilirubin: 0.3 mg/dL (ref 0.2–1.2)
Total Protein: 6.5 g/dL (ref 6.0–8.3)

## 2020-02-19 MED ORDER — ALPRAZOLAM 0.5 MG PO TABS: 0.2500 mg | ORAL_TABLET | Freq: Every evening | ORAL | 1 refills | Status: DC | PRN

## 2020-02-19 NOTE — Patient Instructions (Signed)
Continue taking calcium and vitamin D daily  Stay active  GO TO THE LAB : Get the blood work     [place], please reschedule your appointments Come back for   for a checkup in 6 months   Fall Prevention in the Home, Adult Falls can cause injuries and can affect people from all age groups. There are many simple things that you can do to make your home safe and to help prevent falls. Ask for help when making these changes, if needed. What actions can I take to prevent falls? General instructions  Use good lighting in all rooms. Replace any light bulbs that burn out.  Turn on lights if it is dark. Use night-lights.  Place frequently used items in easy-to-reach places. Lower the shelves around your home if necessary.  Set up furniture so that there are clear paths around it. Avoid moving your furniture around.  Remove throw rugs and other tripping hazards from the floor.  Avoid walking on wet floors.  Fix any uneven floor surfaces.  Add color or contrast paint or tape to grab bars and handrails in your home. Place contrasting color strips on the first and last steps of stairways.  When you use a stepladder, make sure that it is completely opened and that the sides are firmly locked. Have someone hold the ladder while you are using it. Do not climb a closed stepladder.  Be aware of any and all pets. What can I do in the bathroom?      Keep the floor dry. Immediately clean up any water that spills onto the floor.  Remove soap buildup in the tub or shower on a regular basis.  Use non-skid mats or decals on the floor of the tub or shower.  Attach bath mats securely with double-sided, non-slip rug tape.  If you need to sit down while you are in the shower, use a plastic, non-slip stool.  Install grab bars by the toilet and in the tub and shower. Do not use towel bars as grab bars. What can I do in the bedroom?  Make sure that a bedside light is easy to  reach.  Do not use oversized bedding that drapes onto the floor.  Have a firm chair that has side arms to use for getting dressed. What can I do in the kitchen?  Clean up any spills right away.  If you need to reach for something above you, use a sturdy step stool that has a grab bar.  Keep electrical cables out of the way.  Do not use floor polish or wax that makes floors slippery. If you must use wax, make sure that it is non-skid floor wax. What can I do in the stairways?  Do not leave any items on the stairs.  Make sure that you have a light switch at the top of the stairs and the bottom of the stairs. Have them installed if you do not have them.  Make sure that there are handrails on both sides of the stairs. Fix handrails that are broken or loose. Make sure that handrails are as long as the stairways.  Install non-slip stair treads on all stairs in your home.  Avoid having throw rugs at the top or bottom of stairways, or secure the rugs with carpet tape to prevent them from moving.  Choose a carpet design that does not hide the edge of steps on the stairway.  Check any carpeting to make  sure that it is firmly attached to the stairs. Fix any carpet that is loose or worn. What can I do on the outside of my home?  Use bright outdoor lighting.  Regularly repair the edges of walkways and driveways and fix any cracks.  Remove high doorway thresholds.  Trim any shrubbery on the main path into your home.  Regularly check that handrails are securely fastened and in good repair. Both sides of any steps should have handrails.  Install guardrails along the edges of any raised decks or porches.  Clear walkways of debris and clutter, including tools and rocks.  Have leaves, snow, and ice cleared regularly.  Use sand or salt on walkways during winter months.  In the garage, clean up any spills right away, including grease or oil spills. What other actions can I take?  Wear  closed-toe shoes that fit well and support your feet. Wear shoes that have rubber soles or low heels.  Use mobility aids as needed, such as canes, walkers, scooters, and crutches.  Review your medicines with your health care provider. Some medicines can cause dizziness or changes in blood pressure, which increase your risk of falling. Talk with your health care provider about other ways that you can decrease your risk of falls. This may include working with a physical therapist or trainer to improve your strength, balance, and endurance. Where to find more information  Centers for Disease Control and Prevention, STEADI: WebmailGuide.co.za  [employer] on Aging: BrainJudge.co.uk Contact a health care provider if:  You are afraid of falling at home.  You feel weak, drowsy, or dizzy at home.  You fall at home. Summary  There are many simple things that you can do to make your home safe and to help prevent falls.  Ways to make your home safe include removing tripping hazards and installing grab bars in the bathroom.  Ask for help when making these changes in your home. This information is not intended to replace advice given to you by your health care provider. Make sure you discuss any questions you have with your health care provider. Document Revised: 10/21/2017 Document Reviewed: 06/23/2017 Elsevier Patient Education  2020 [employer].

## 2020-02-19 NOTE — Progress Notes (Signed)
Pre visit review using our clinic review tool, if applicable. No additional management support is needed unless otherwise documented below in the visit note. 

## 2020-02-19 NOTE — Progress Notes (Signed)
Subjective:    Patient ID: Carol Wilson, female    DOB: 1946-10-15, 74 y.o.   MRN: [id number]  DOS:  02/19/2020 Type of visit - description: CPX In general doing well. She did have a mechanical fall 2 weeks ago, landed on her right chest, scraped her hands. Hands are much better, right lateral chest pain much improved.   Review of Systems  Other than above, a 14 point review of systems is negative     Past Medical History:  Diagnosis Date  . Anxiety   . Colon polyps   . Depression    started ~ 2008 after she lost a daughter  . Familial tremor   . HTN (hypertension)    well controlled with propranolol  . Hyperlipidemia   . Osteopenia   . Rosacea    sees derm   Family History  Problem Relation Age of Onset  . Lung cancer Daughter   . Coronary artery disease Mother        M 58y/o and F 68y/o  . Heart attack Father   . Alcohol abuse Brother   . Tuberculosis Daughter   . Diabetes Neg Hx   . Hypertension Neg Hx   . Stroke Neg Hx   . Colon cancer Neg Hx   . Breast cancer Neg Hx      Past Surgical History:  Procedure Laterality Date  . APPENDECTOMY    . lenses implant B 2018 Bilateral 2018  . TUBAL LIGATION      Allergies as of 02/19/2020      Reactions   Sulfa Antibiotics Rash   RASH      Medication List       Accurate as of February 19, 2020 11:59 PM. If you have any questions, ask your nurse or doctor.        ALPRAZolam 0.5 MG tablet Commonly known as: XANAX Take 0.5-1 tablets (0.25-0.5 mg total) by mouth at bedtime as needed for sleep.   ezetimibe-simvastatin 10-40 MG tablet Commonly known as: VYTORIN Take 1 tablet by mouth daily.   fluticasone 50 MCG/ACT nasal spray Commonly known as: FLONASE Place 2 sprays into both nostrils daily.   loperamide 2 MG capsule Commonly known as: IMODIUM Take 2 mg by mouth daily.   metroNIDAZOLE 1 % gel Commonly known as: METROGEL Apply 1 application topically daily.   primidone 50 MG tablet Commonly  known as: MYSOLINE Take 0.5 tablets (25 mg total) by mouth at bedtime.   propranolol 10 MG tablet Commonly known as: INDERAL TAKE 1 TABLET BY MOUTH  DAILY   sertraline 100 MG tablet Commonly known as: ZOLOFT Take 1 tablet (100 mg total) by mouth daily.          Objective:   Physical Exam BP 134/72 (BP Location: Left Arm, Patient Position: Sitting, Cuff Size: Small)   Pulse (!) 58   Temp (!) 97.5 F (36.4 C) (Temporal)   Resp 16   Ht 5\' 6"  (1.676 m)   Wt 158 lb 4 oz (71.8 kg)   SpO2 98%   BMI 25.54 kg/m  General: Well developed, NAD, BMI noted Neck: No  thyromegaly  HEENT:  Normocephalic . Face symmetric, atraumatic Lungs:  CTA B Normal respiratory effort, no intercostal retractions, no accessory muscle use. Chest wall: Minimal TTP at the right lateral aspect of the chest wall, no particular point is tender Heart: RRR,  no murmur.  Abdomen:  Not distended, soft, non-tender. No rebound or rigidity.   Lower  extremities: no pretibial edema bilaterally  Skin: Exposed areas without rash. Not pale. Not jaundice Neurologic:  alert & oriented X3.  Speech normal, gait appropriate for age and unassisted Strength symmetric and appropriate for age.  Psych: Cognition and judgment appear intact.  Cooperative with normal attention span and concentration.  Behavior appropriate. No anxious or depressed appearing.     Assessment    ASSESSMENT Hyperlipidemia  Depression , insomnia --onset 2008, lost a daughter Neurology: -Essential tremor- Dr Tat -Hyper reflexion Osteopenia dexa: 2012, 2014, 01/14/2016: T score -0.7. Rosacea   Sees dermatology Allergic rhinitis Chronic Diarrhea: Saw GI, celiac w/u negative,  colonoscopy 01-2018, had polyps, biopsy was negative for colitis, trial with Colestid failed.    PLAN: Here for CPX Hyperlipidemia: On Vytorin, checking labs Depression, insomnia: Well-controlled, on Zoloft, rarely uses Xanax, Rx sent Osteopenia: Currently on  calcium and vitamin D. DEXA 07-2018 show increase T score from (-)  0.7 to (-) 1.8, took Fosamax temporarily without apparent problems but then stopped, no particular reason, not very interested on treatment.  We agreed to recheck a DEXA when she comes back. Rosacea: On MetroGel as needed Chronic diarrhea: Doing well with a single dose of Imodium daily RTC 6 months   This visit occurred during the SARS-CoV-2 public health emergency.  Safety protocols were in place, including screening questions prior to the visit, additional usage of staff PPE, and extensive cleaning of exam room while observing appropriate contact time as indicated for disinfecting solutions.

## 2020-02-20 NOTE — Assessment & Plan Note (Signed)
-  Td 07-2017 -Pneumonia shot - 2012 -prevnar 2015 - shingles shot :completed -Covid vaccination: Has not proceeded, have many doubts, discussed pros >> cons -Cervical cancer screening: no further PAPs per guidelines, pt agrees -Breast cancer screening:  MMG 09/2019 -CCS: no FH; colonoscopy 2002 (-) ;   Colonoscopy 05/2011, colonoscopy 02/17/2018, next per GI -Labs:  CMP, FLP, CBC -Diet and exercise : Discussed, doing well Fall prevention discussed

## 2020-02-20 NOTE — Assessment & Plan Note (Signed)
Here for CPX Hyperlipidemia: On Vytorin, checking labs Depression, insomnia: Well-controlled, on Zoloft, rarely uses Xanax, Rx sent Osteopenia: Currently on calcium and vitamin D. DEXA 07-2018 show increase T score from (-)  0.7 to (-) 1.8, took Fosamax temporarily without apparent problems but then stopped, no particular reason, not very interested on treatment.  We agreed to recheck a DEXA when she comes back. Rosacea: On MetroGel as needed Chronic diarrhea: Doing well with a single dose of Imodium daily RTC 6 months

## 2020-06-19 DIAGNOSIS — L821 Other seborrheic keratosis: Secondary | ICD-10-CM | POA: Diagnosis not present

## 2020-06-19 DIAGNOSIS — D485 Neoplasm of uncertain behavior of skin: Secondary | ICD-10-CM | POA: Diagnosis not present

## 2020-06-19 DIAGNOSIS — L719 Rosacea, unspecified: Secondary | ICD-10-CM | POA: Diagnosis not present

## 2020-07-01 ENCOUNTER — Other Ambulatory Visit: Payer: Self-pay | Admitting: Internal Medicine

## 2020-07-01 MED ORDER — PROPRANOLOL HCL 10 MG PO TABS: 10.0000 mg | ORAL_TABLET | Freq: Every day | ORAL | 1 refills | Status: DC

## 2020-07-01 NOTE — Telephone Encounter (Signed)
Patient notified that rx has been sent in. 

## 2020-07-01 NOTE — Telephone Encounter (Signed)
Medication: propranolol (INDERAL) 10 MG tablet [[id number]]       Has the patient contacted their pharmacy?  (If no, request that the patient contact the pharmacy for the refill.) (If yes, when and what did the pharmacy advise?)     Preferred Pharmacy (with phone number or street name): [facility], [address], [place], [place], [place] [address]  Phone:  [phone] Fax:  [phone]      Agent: Please be advised that RX refills may take up to 3 business days. We ask that you follow-up with your pharmacy.

## 2020-07-30 ENCOUNTER — Other Ambulatory Visit: Payer: Self-pay

## 2020-07-30 MED ORDER — PROPRANOLOL HCL 10 MG PO TABS: 10.0000 mg | ORAL_TABLET | Freq: Every day | ORAL | 1 refills | Status: DC

## 2020-07-30 MED ORDER — SERTRALINE HCL 100 MG PO TABS: 100.0000 mg | ORAL_TABLET | Freq: Every day | ORAL | 1 refills | Status: DC

## 2020-07-30 MED ORDER — EZETIMIBE-SIMVASTATIN 10-40 MG PO TABS: 1.0000 | ORAL_TABLET | Freq: Every day | ORAL | 1 refills | Status: DC

## 2020-08-04 ENCOUNTER — Telehealth: Payer: Self-pay | Admitting: Internal Medicine

## 2020-08-04 MED ORDER — PROPRANOLOL HCL 10 MG PO TABS: 10.0000 mg | ORAL_TABLET | Freq: Every day | ORAL | 1 refills | Status: DC

## 2020-08-04 NOTE — Telephone Encounter (Signed)
Rx resent.

## 2020-08-04 NOTE — Telephone Encounter (Signed)
Patient states her pharmacy needs the propranolol RX re-faxed to them at [phone].

## 2020-08-05 MED ORDER — PROPRANOLOL HCL 10 MG PO TABS: 10.0000 mg | ORAL_TABLET | Freq: Every day | ORAL | 1 refills | Status: DC

## 2020-08-05 NOTE — Telephone Encounter (Signed)
Pt has been requesting it to [facility] instead of [facility] mail order. Will resend to [facility].

## 2020-08-05 NOTE — Addendum Note (Signed)
Addended byDamita Dunnings D on: 08/05/2020 04:55 PM   Modules accepted: Orders

## 2020-08-05 NOTE — Telephone Encounter (Signed)
Brendell with [facility] states they did not receive propranalol RX. Please fax again to [phone].

## 2020-08-20 ENCOUNTER — Ambulatory Visit: Payer: Medicare PPO | Admitting: Internal Medicine

## 2020-08-28 ENCOUNTER — Encounter: Payer: Self-pay | Admitting: Internal Medicine

## 2020-08-28 ENCOUNTER — Ambulatory Visit: Payer: Medicare PPO | Admitting: Internal Medicine

## 2020-08-28 ENCOUNTER — Other Ambulatory Visit: Payer: Self-pay

## 2020-08-28 VITALS — BP 117/71 | HR 59 | Temp 98.4°F | Resp 16 | Ht 66.0 in | Wt 155.2 lb

## 2020-08-28 DIAGNOSIS — M8580 Other specified disorders of bone density and structure, unspecified site: Secondary | ICD-10-CM | POA: Diagnosis not present

## 2020-08-28 DIAGNOSIS — G250 Essential tremor: Secondary | ICD-10-CM | POA: Diagnosis not present

## 2020-08-28 DIAGNOSIS — Z780 Asymptomatic menopausal state: Secondary | ICD-10-CM

## 2020-08-28 DIAGNOSIS — Z23 Encounter for immunization: Secondary | ICD-10-CM | POA: Diagnosis not present

## 2020-08-28 NOTE — Progress Notes (Signed)
Pre visit review using our clinic review tool, if applicable. No additional management support is needed unless otherwise documented below in the visit note. 

## 2020-08-28 NOTE — Patient Instructions (Signed)
    GO TO THE FRONT DESK, PLEASE SCHEDULE YOUR APPOINTMENTS Come back for   a physical exam by March 2022.      STOP BY THE FIRST FLOOR: Schedule your bone density test

## 2020-08-28 NOTE — Progress Notes (Signed)
Subjective:    Patient ID: Carol Wilson, female    DOB: 08-Mar-1946, 74 y.o.   MRN: [id number]  DOS:  08/28/2020 Type of visit - description: Routine visit Feels well, has no major concerns. Good compliance with medication. She does take calcium and vitamin D. Has not proceed with COVID vaccination or flu shot.  Review of Systems See above   Past Medical History:  Diagnosis Date  . Anxiety   . Colon polyps   . Depression    started ~ 2008 after she lost a daughter  . Familial tremor   . HTN (hypertension)    well controlled with propranolol  . Hyperlipidemia   . Osteopenia   . Rosacea    sees derm    Past Surgical History:  Procedure Laterality Date  . APPENDECTOMY    . lenses implant B 2018 Bilateral 2018  . TUBAL LIGATION      Allergies as of 08/28/2020      Reactions   Sulfa Antibiotics Rash   RASH      Medication List       Accurate as of August 28, 2020 11:59 PM. If you have any questions, ask your nurse or doctor.        ALPRAZolam 0.5 MG tablet Commonly known as: XANAX Take 0.5-1 tablets (0.25-0.5 mg total) by mouth at bedtime as needed for sleep.   ezetimibe-simvastatin 10-40 MG tablet Commonly known as: VYTORIN Take 1 tablet by mouth daily.   fluticasone 50 MCG/ACT nasal spray Commonly known as: FLONASE Place 2 sprays into both nostrils daily.   loperamide 2 MG capsule Commonly known as: IMODIUM Take 2 mg by mouth daily.   metroNIDAZOLE 1 % gel Commonly known as: METROGEL Apply 1 application topically daily.   primidone 50 MG tablet Commonly known as: MYSOLINE Take 0.5 tablets (25 mg total) by mouth at bedtime.   propranolol 10 MG tablet Commonly known as: INDERAL Take 1 tablet (10 mg total) by mouth daily.   sertraline 100 MG tablet Commonly known as: ZOLOFT Take 1 tablet (100 mg total) by mouth daily.          Objective:   Physical Exam BP 117/71 (BP Location: Left Arm, Patient Position: Sitting, Cuff Size: Small)    Pulse (!) 59   Temp 98.4 F (36.9 C) (Oral)   Resp 16   Ht 5\' 6"  (1.676 m)   Wt 155 lb 4 oz (70.4 kg)   SpO2 96%   BMI 25.06 kg/m  General:   Well developed, NAD, BMI noted. HEENT:  Normocephalic . Face symmetric, atraumatic Lungs:  CTA B Normal respiratory effort, no intercostal retractions, no accessory muscle use. Heart: RRR,  no murmur.  Lower extremities: no pretibial edema bilaterally  Skin: Not pale. Not jaundice Neurologic:  alert & oriented X3.  Speech normal, gait appropriate for age and unassisted Psych--  Cognition and judgment appear intact.  Cooperative with normal attention span and concentration.  Behavior appropriate. No anxious or depressed appearing.      Assessment    ASSESSMENT Hyperlipidemia  Depression , insomnia --onset 2008, lost a daughter Neurology: -Essential tremor- Dr Tat -Hyper reflexion Osteopenia dexa: 2012, 2014, 01/14/2016: T score -0.7. Rosacea   Sees dermatology Allergic rhinitis Chronic Diarrhea: Saw GI, celiac w/u negative,  colonoscopy 01-2018, had polyps, biopsy was negative for colitis, trial with Colestid failed.    PLAN: Essential tremor: On Inderal. High cholesterol: On Vytorin, last FLP very good. Osteopenia: On calcium, vitamin D,  recommend to stay active, check a bone density test. Preventive care: Hesitant to get the flu shot, benefits discussed, she decided to proceed. Hesitant to get Covid vaccination, extensive discussion about pros and cons, encouraged to get her own information from reliable sources such as [place], [facility], [place].  She will consider it. RTC CPX 01-2021   This visit occurred during the SARS-CoV-2 public health emergency.  Safety protocols were in place, including screening questions prior to the visit, additional usage of staff PPE, and extensive cleaning of exam room while observing appropriate contact time as indicated for disinfecting solutions.

## 2020-08-30 NOTE — Assessment & Plan Note (Addendum)
Essential tremor: On Inderal. High cholesterol: On Vytorin, last FLP very good. Osteopenia: On calcium, vitamin D, recommend to stay active, check a bone density test. Preventive care: Hesitant to get the flu shot, benefits discussed, she decided to proceed. Hesitant to get Covid vaccination, extensive discussion about pros and cons, encouraged to get her own information from reliable sources such as [place], [facility], [place].  She will consider it. RTC CPX 01-2021

## 2020-09-26 ENCOUNTER — Other Ambulatory Visit: Payer: Self-pay | Admitting: Internal Medicine

## 2020-09-26 DIAGNOSIS — Z1231 Encounter for screening mammogram for malignant neoplasm of breast: Secondary | ICD-10-CM

## 2020-10-07 ENCOUNTER — Ambulatory Visit (HOSPITAL_BASED_OUTPATIENT_CLINIC_OR_DEPARTMENT_OTHER)
Admission: RE | Admit: 2020-10-07 | Discharge: 2020-10-07 | Disposition: A | Discharge status: Home / Self Care | Payer: Medicare PPO | Source: Ambulatory Visit | Attending: Internal Medicine | Admitting: Internal Medicine

## 2020-10-07 ENCOUNTER — Other Ambulatory Visit: Payer: Self-pay

## 2020-10-07 DIAGNOSIS — Z780 Asymptomatic menopausal state: Secondary | ICD-10-CM | POA: Insufficient documentation

## 2020-10-07 DIAGNOSIS — R29890 Loss of height: Secondary | ICD-10-CM | POA: Diagnosis not present

## 2020-10-07 DIAGNOSIS — M8580 Other specified disorders of bone density and structure, unspecified site: Secondary | ICD-10-CM | POA: Diagnosis not present

## 2020-10-07 DIAGNOSIS — M85852 Other specified disorders of bone density and structure, left thigh: Secondary | ICD-10-CM | POA: Diagnosis not present

## 2020-10-09 MED ORDER — ALENDRONATE SODIUM 70 MG PO TABS: 70.0000 mg | ORAL_TABLET | ORAL | 3 refills | Status: DC

## 2020-10-09 NOTE — Addendum Note (Signed)
Addended byDamita Dunnings D on: 10/09/2020 03:18 PM   Modules accepted: Orders

## 2020-11-04 ENCOUNTER — Ambulatory Visit
Admission: RE | Admit: 2020-11-04 | Discharge: 2020-11-04 | Disposition: A | Discharge status: Home / Self Care | Payer: Medicare PPO | Source: Ambulatory Visit | Attending: Internal Medicine | Admitting: Internal Medicine

## 2020-11-04 ENCOUNTER — Other Ambulatory Visit: Payer: Self-pay

## 2020-11-04 DIAGNOSIS — Z1231 Encounter for screening mammogram for malignant neoplasm of breast: Secondary | ICD-10-CM

## 2020-12-19 ENCOUNTER — Other Ambulatory Visit: Payer: Self-pay | Admitting: Internal Medicine

## 2021-01-19 ENCOUNTER — Ambulatory Visit: Payer: Medicare PPO | Admitting: *Deleted

## 2021-02-19 ENCOUNTER — Ambulatory Visit (INDEPENDENT_AMBULATORY_CARE_PROVIDER_SITE_OTHER): Payer: Medicare PPO | Admitting: Internal Medicine

## 2021-02-19 ENCOUNTER — Encounter: Payer: Self-pay | Admitting: Internal Medicine

## 2021-02-19 ENCOUNTER — Other Ambulatory Visit: Payer: Self-pay

## 2021-02-19 ENCOUNTER — Ambulatory Visit (INDEPENDENT_AMBULATORY_CARE_PROVIDER_SITE_OTHER): Payer: Medicare PPO

## 2021-02-19 VITALS — BP 126/68 | HR 69 | Temp 98.4°F | Resp 16 | Ht 66.0 in | Wt 156.2 lb

## 2021-02-19 DIAGNOSIS — Z0001 Encounter for general adult medical examination with abnormal findings: Secondary | ICD-10-CM

## 2021-02-19 DIAGNOSIS — Z0000 Encounter for general adult medical examination without abnormal findings: Secondary | ICD-10-CM | POA: Diagnosis not present

## 2021-02-19 DIAGNOSIS — E785 Hyperlipidemia, unspecified: Secondary | ICD-10-CM

## 2021-02-19 DIAGNOSIS — R002 Palpitations: Secondary | ICD-10-CM | POA: Diagnosis not present

## 2021-02-19 LAB — CBC WITH DIFFERENTIAL/PLATELET
Basophils Absolute: 0 10*3/uL (ref 0.0–0.1)
Basophils Relative: 0.6 % (ref 0.0–3.0)
Eosinophils Absolute: 0.1 10*3/uL (ref 0.0–0.7)
Eosinophils Relative: 2.1 % (ref 0.0–5.0)
HCT: 37.9 % (ref 36.0–46.0)
Hemoglobin: 12.5 g/dL (ref 12.0–15.0)
Lymphocytes Relative: 24.5 % (ref 12.0–46.0)
Lymphs Abs: 1.5 10*3/uL (ref 0.7–4.0)
MCHC: 32.9 g/dL (ref 30.0–36.0)
MCV: 82.5 fl (ref 78.0–100.0)
Monocytes Absolute: 0.5 10*3/uL (ref 0.1–1.0)
Monocytes Relative: 8.3 % (ref 3.0–12.0)
Neutro Abs: 4 10*3/uL (ref 1.4–7.7)
Neutrophils Relative %: 64.5 % (ref 43.0–77.0)
Platelets: 300 10*3/uL (ref 150.0–400.0)
RBC: 4.6 Mil/uL (ref 3.87–5.11)
RDW: 13.6 % (ref 11.5–15.5)
WBC: 6.2 10*3/uL (ref 4.0–10.5)

## 2021-02-19 LAB — LIPID PANEL
Cholesterol: 181 mg/dL (ref 0–200)
HDL: 65.3 mg/dL (ref >39.00)
LDL Cholesterol: 97 mg/dL (ref 0–99)
NonHDL: 115.6
Total CHOL/HDL Ratio: 3
Triglycerides: 91 mg/dL (ref 0.0–149.0)
VLDL: 18.2 mg/dL (ref 0.0–40.0)

## 2021-02-19 LAB — COMPREHENSIVE METABOLIC PANEL
ALT: 13 U/L (ref 0–35)
AST: 19 U/L (ref 0–37)
Albumin: 4.6 g/dL (ref 3.5–5.2)
Alkaline Phosphatase: 79 U/L (ref 39–117)
BUN: 18 mg/dL (ref 6–23)
CO2: 32 mEq/L (ref 19–32)
Calcium: 10.6 mg/dL — ABNORMAL HIGH (ref 8.4–10.5)
Chloride: 104 mEq/L (ref 96–112)
Creatinine, Ser: 0.87 mg/dL (ref 0.40–1.20)
GFR: 65.41 mL/min (ref >60.00)
Glucose, Bld: 102 mg/dL — ABNORMAL HIGH (ref 70–99)
Potassium: 4.5 mEq/L (ref 3.5–5.1)
Sodium: 140 mEq/L (ref 135–145)
Total Bilirubin: 0.4 mg/dL (ref 0.2–1.2)
Total Protein: 7.1 g/dL (ref 6.0–8.3)

## 2021-02-19 LAB — TSH: TSH: 5.03 u[IU]/mL — ABNORMAL HIGH (ref 0.35–4.50)

## 2021-02-19 NOTE — Progress Notes (Signed)
Subjective:    Patient ID: Carol Wilson, female    DOB: [birth year], 75 y.o.   MRN: [id number]  DOS:  02/19/2021 Type of visit - description: CPX In addition to CPX, she reports episodes of palpitations: Symptoms started 6 months ago, happening approximately twice a week, associated with some lightheadedness tiredness.  Not associated with syncope, chest pain, shortness of breath. She calls these episodes "panic attacks" however her stress is at baseline. Exercises regularly w/o problems   Occasionally neck pain, left-sided, no radiation to the arms, no paresthesias. No recent falls   Review of Systems  Other than above, a 14 point review of systems is negative      Past Medical History:  Diagnosis Date  . Anxiety   . Colon polyps   . Depression    started ~ 2008 after she lost a daughter  . Familial tremor   . HTN (hypertension)    well controlled with propranolol  . Hyperlipidemia   . Osteopenia   . Rosacea    sees derm    Past Surgical History:  Procedure Laterality Date  . APPENDECTOMY    . lenses implant B 2018 Bilateral 2018  . TUBAL LIGATION      Allergies as of 02/19/2021      Reactions   Sulfa Antibiotics Rash   RASH      Medication List       Accurate as of February 19, 2021  1:47 PM. If you have any questions, ask your nurse or doctor.        alendronate 70 MG tablet Commonly known as: FOSAMAX Take 1 tablet (70 mg total) by mouth every 7 (seven) days. Take with a full glass of water on an empty stomach. Remain upright for 30-45 mins.   ALPRAZolam 0.5 MG tablet Commonly known as: XANAX Take 0.5-1 tablets (0.25-0.5 mg total) by mouth at bedtime as needed for sleep.   ezetimibe-simvastatin 10-40 MG tablet Commonly known as: VYTORIN Take 1 tablet by mouth daily.   fluticasone 50 MCG/ACT nasal spray Commonly known as: FLONASE Place 2 sprays into both nostrils daily.   loperamide 2 MG capsule Commonly known as: IMODIUM Take 2 mg by mouth  daily.   metroNIDAZOLE 1 % gel Commonly known as: METROGEL Apply 1 application topically daily.   primidone 50 MG tablet Commonly known as: MYSOLINE Take 0.5 tablets (25 mg total) by mouth at bedtime.   propranolol 10 MG tablet Commonly known as: INDERAL Take 1 tablet (10 mg total) by mouth daily.   sertraline 100 MG tablet Commonly known as: ZOLOFT Take 1 tablet (100 mg total) by mouth daily.          Objective:   Physical Exam BP 126/68 (BP Location: Left Arm, Patient Position: Sitting, Cuff Size: Small)   Pulse 69   Temp 98.4 F (36.9 C) (Oral)   Resp 16   Ht 5\' 6"  (1.676 m)   Wt 156 lb 4 oz (70.9 kg)   SpO2 98%   BMI 25.22 kg/m  General: Well developed, NAD, BMI noted Neck: No  thyromegaly  HEENT:  Normocephalic . Face symmetric, atraumatic Lungs:  CTA B Normal respiratory effort, no intercostal retractions, no accessory muscle use. Heart: RRR,  no murmur.  Abdomen:  Not distended, soft, non-tender. No rebound or rigidity.   Lower extremities: no pretibial edema bilaterally  Skin: Exposed areas without rash. Not pale. Not jaundice Neurologic:  alert & oriented X3.  Speech normal, gait appropriate for  age and unassisted.  Mild tremors noted Psych: Cognition and judgment appear intact.  Cooperative with normal attention span and concentration.  Behavior appropriate. No anxious or depressed appearing.     Assessment      ASSESSMENT Hyperlipidemia  Depression , insomnia --onset 2008, lost a daughter Neurology: -Essential tremor- Dr Tat -Hyper reflexion Osteoporosis dexa: 2012, 2014, 01/14/2016: T score -0.7. 11/-2021 T score  (-) 2.4, rx fosamax  Rosacea   Sees dermatology Allergic rhinitis Chronic Diarrhea: Saw GI, celiac w/u negative,  colonoscopy 01-2018, had polyps, biopsy was negative for colitis, trial with Colestid failed.   +FH CAD  PLAN: Here for CPX Hyperlipidemia, + FH CAD, on Vytorin, checking labs Depression, insomnia: On Xanax  and Zoloft.  Overall seems well controlled except for palpitations.  See next Palpitations: As described above, patient calls sxs "panic attacks" but she does not feel particularly anxious EKG today: NSR.  Options discussed: 7-day monitor versus cardiology referral.  Elected 7-day monitor, will arrange, if symptoms severe or persistent: ER. Osteoporosis: Bone density T score November 2021 (-) 2.4, started Fosamax, no apparent side effects, last vitamin D level  was very good. RTC 4 months   In addition to CPX, we addressed an new problem (palpitations) and her chronic medical issues  This visit occurred during the SARS-CoV-2 public health emergency.  Safety protocols were in place, including screening questions prior to the visit, additional usage of staff PPE, and extensive cleaning of exam room while observing appropriate contact time as indicated for disinfecting solutions.

## 2021-02-19 NOTE — Patient Instructions (Addendum)
Consider started your Covid vaccine series  Consider Shingrix, vaccine against shingles, you can get it at your pharmacy   [facility] : Get the blood work     [place], [place] back for   for a checkup in 4 months     Fall Prevention in the Home, Adult Falls can cause injuries and can affect people from all age groups. There are many simple things that you can do to make your home safe and to help prevent falls. Ask for help when making these changes, if needed. What actions can I take to prevent falls? General instructions  Use good lighting in all rooms. Replace any light bulbs that burn out.  Turn on lights if it is dark. Use night-lights.  Place frequently used items in easy-to-reach places. Lower the shelves around your home if necessary.  Set up furniture so that there are clear paths around it. Avoid moving your furniture around.  Remove throw rugs and other tripping hazards from the floor.  Avoid walking on wet floors.  Fix any uneven floor surfaces.  Add color or contrast paint or tape to grab bars and handrails in your home. Place contrasting color strips on the first and last steps of stairways.  When you use a stepladder, make sure that it is completely opened and that the sides are firmly locked. Have someone hold the ladder while you are using it. Do not climb a closed stepladder.  Be aware of any and all pets. What can I do in the bathroom?  Keep the floor dry. Immediately clean up any water that spills onto the floor.  Remove soap buildup in the tub or shower on a regular basis.  Use non-skid mats or decals on the floor of the tub or shower.  Attach bath mats securely with double-sided, non-slip rug tape.  If you need to sit down while you are in the shower, use a plastic, non-slip stool.  Install grab bars by the toilet and in the tub and shower. Do not use towel bars as grab bars.      What can I do in  the bedroom?  Make sure that a bedside light is easy to reach.  Do not use oversized bedding that drapes onto the floor.  Have a firm chair that has side arms to use for getting dressed. What can I do in the kitchen?  Clean up any spills right away.  If you need to reach for something above you, use a sturdy step stool that has a grab bar.  Keep electrical cables out of the way.  Do not use floor polish or wax that makes floors slippery. If you must use wax, make sure that it is non-skid floor wax. What can I do in the stairways?  Do not leave any items on the stairs.  Make sure that you have a light switch at the top of the stairs and the bottom of the stairs. Have them installed if you do not have them.  Make sure that there are handrails on both sides of the stairs. Fix handrails that are broken or loose. Make sure that handrails are as long as the stairways.  Install non-slip stair treads on all stairs in your home.  Avoid having throw rugs at the top or bottom of stairways, or secure the rugs with carpet tape to prevent them from moving.  Choose a Loss adjuster, chartered that does not hide  the edge of steps on the stairway.  Check any carpeting to make sure that it is firmly attached to the stairs. Fix any carpet that is loose or worn. What can I do on the outside of my home?  Use bright outdoor lighting.  Regularly repair the edges of walkways and driveways and fix any cracks.  Remove high doorway thresholds.  Trim any shrubbery on the main path into your home.  Regularly check that handrails are securely fastened and in good repair. Both sides of any steps should have handrails.  Install guardrails along the edges of any raised decks or porches.  Clear walkways of debris and clutter, including tools and rocks.  Have leaves, snow, and ice cleared regularly.  Use sand or salt on walkways during winter months.  In the garage, clean up any spills right away, including  grease or oil spills. What other actions can I take?  Wear closed-toe shoes that fit well and support your feet. Wear shoes that have rubber soles or low heels.  Use mobility aids as needed, such as canes, walkers, scooters, and crutches.  Review your medicines with your health care provider. Some medicines can cause dizziness or changes in blood pressure, which increase your risk of falling. Talk with your health care provider about other ways that you can decrease your risk of falls. This may include working with a physical therapist or trainer to improve your strength, balance, and endurance. Where to find more information  Centers for Disease Control and Prevention, [employer]AD[employer]: WebmailGuide.co.za  [employer] on Aging: BrainJudge.co.uk Contact a health care provider if:  You are afraid of falling at home.  You feel weak, drowsy, or dizzy at home.  You fall at home. Summary  There are many simple things that you can do to make your home safe and to help prevent falls.  Ways to make your home safe include removing tripping hazards and installing grab bars in the bathroom.  Ask for help when making these changes in your home. This information is not intended to replace advice given to you by your health care provider. Make sure you discuss any questions you have with your health care provider. Document Revised: 10/21/2017 Document Reviewed: 06/23/2017 Elsevier Patient Education  2021 [employer].

## 2021-02-19 NOTE — Assessment & Plan Note (Signed)
-  [id number] -Pneumonia shot - 2012; prevnar 2015 - zostavax completed, rec shingrex -Covid vaccination: Declines so far, benefits discussed, rec to think about it - Had a flu shot -Cervical cancer screening:no further PAPs per guidelines, pt agrees -Breast cancer screening:MMG 10/2020 ([facility]) -CCS: no FH; colonoscopy 2002 (-) ; Colonoscopy 05/2011, colonoscopy 02/17/2018, next per GI -Labs:  CMP, FLP, CBC, TSH, -Diet and exercise : Discussed, doing well -Fall prevention discussed -Advance directives: POA on file

## 2021-02-19 NOTE — Assessment & Plan Note (Signed)
Here for CPX Hyperlipidemia, + FH CAD, on Vytorin, checking labs Depression, insomnia: On Xanax and Zoloft.  Overall seems well controlled except for palpitations.  See next Palpitations: As described above, patient calls sxs "panic attacks" but she does not feel particularly anxious EKG today: NSR.  Options discussed: 7-day monitor versus cardiology referral.  Elected 7-day monitor, will arrange, if symptoms severe or persistent: ER. Osteoporosis: Bone density T score November 2021 (-) 2.4, started Fosamax, no apparent side effects, last vitamin D level  was very good. RTC 4 months

## 2021-02-20 ENCOUNTER — Encounter: Payer: Self-pay | Admitting: Radiology

## 2021-02-20 NOTE — Progress Notes (Signed)
Enrolled patient for a 14 day Zio XT  monitor to be mailed to patients home  °

## 2021-02-24 DIAGNOSIS — R002 Palpitations: Secondary | ICD-10-CM | POA: Diagnosis not present

## 2021-03-06 DIAGNOSIS — R002 Palpitations: Secondary | ICD-10-CM | POA: Diagnosis not present

## 2021-06-02 ENCOUNTER — Telehealth: Payer: Self-pay

## 2021-06-02 ENCOUNTER — Other Ambulatory Visit: Payer: Self-pay

## 2021-06-02 MED ORDER — PROPRANOLOL HCL 10 MG PO TABS: 10.0000 mg | ORAL_TABLET | Freq: Every day | ORAL | 1 refills | Status: DC

## 2021-06-02 MED ORDER — SERTRALINE HCL 100 MG PO TABS: 100.0000 mg | ORAL_TABLET | Freq: Every day | ORAL | 1 refills | Status: DC

## 2021-06-02 NOTE — Telephone Encounter (Signed)
Requesting: alprazolam 0.5mg  Contract: 04/19/2018 UDS: 04/19/2018 Last Visit: 02/19/2021 Next Visit: 06/22/2021 Last Refill: 02/19/2020 #90 and 1RF Pt sig: 1 tab qhs prn  Please Advise

## 2021-06-03 MED ORDER — ALPRAZOLAM 0.5 MG PO TABS: 0.2500 mg | ORAL_TABLET | Freq: Every evening | ORAL | 0 refills | Status: DC | PRN

## 2021-06-03 NOTE — Telephone Encounter (Signed)
PDMP okay, prescription sent 

## 2021-06-04 DIAGNOSIS — L821 Other seborrheic keratosis: Secondary | ICD-10-CM | POA: Diagnosis not present

## 2021-06-04 DIAGNOSIS — L719 Rosacea, unspecified: Secondary | ICD-10-CM | POA: Diagnosis not present

## 2021-06-16 ENCOUNTER — Other Ambulatory Visit: Payer: Self-pay

## 2021-06-16 ENCOUNTER — Encounter: Payer: Self-pay | Admitting: Internal Medicine

## 2021-06-16 ENCOUNTER — Ambulatory Visit (HOSPITAL_BASED_OUTPATIENT_CLINIC_OR_DEPARTMENT_OTHER)
Admission: RE | Admit: 2021-06-16 | Discharge: 2021-06-16 | Disposition: A | Discharge status: Home / Self Care | Payer: Medicare PPO | Source: Ambulatory Visit | Attending: Internal Medicine | Admitting: Internal Medicine

## 2021-06-16 ENCOUNTER — Ambulatory Visit: Payer: Medicare PPO | Admitting: Internal Medicine

## 2021-06-16 VITALS — BP 120/70 | HR 57 | Temp 98.3°F | Resp 16 | Ht 66.0 in | Wt 155.0 lb

## 2021-06-16 DIAGNOSIS — R0609 Other forms of dyspnea: Secondary | ICD-10-CM

## 2021-06-16 DIAGNOSIS — R7989 Other specified abnormal findings of blood chemistry: Secondary | ICD-10-CM | POA: Diagnosis not present

## 2021-06-16 DIAGNOSIS — R0600 Dyspnea, unspecified: Secondary | ICD-10-CM | POA: Insufficient documentation

## 2021-06-16 DIAGNOSIS — Z7185 Encounter for immunization safety counseling: Secondary | ICD-10-CM | POA: Diagnosis not present

## 2021-06-16 LAB — CBC WITH DIFFERENTIAL/PLATELET
Basophils Absolute: 0 10*3/uL (ref 0.0–0.1)
Basophils Relative: 0.8 % (ref 0.0–3.0)
Eosinophils Absolute: 0.1 10*3/uL (ref 0.0–0.7)
Eosinophils Relative: 2.4 % (ref 0.0–5.0)
HCT: 36.8 % (ref 36.0–46.0)
Hemoglobin: 11.8 g/dL — ABNORMAL LOW (ref 12.0–15.0)
Lymphocytes Relative: 31 % (ref 12.0–46.0)
Lymphs Abs: 1.4 10*3/uL (ref 0.7–4.0)
MCHC: 32.2 g/dL (ref 30.0–36.0)
MCV: 80.9 fl (ref 78.0–100.0)
Monocytes Absolute: 0.4 10*3/uL (ref 0.1–1.0)
Monocytes Relative: 9.4 % (ref 3.0–12.0)
Neutro Abs: 2.5 10*3/uL (ref 1.4–7.7)
Neutrophils Relative %: 56.4 % (ref 43.0–77.0)
Platelets: 290 10*3/uL (ref 150.0–400.0)
RBC: 4.55 Mil/uL (ref 3.87–5.11)
RDW: 16.3 % — ABNORMAL HIGH (ref 11.5–15.5)
WBC: 4.4 10*3/uL (ref 4.0–10.5)

## 2021-06-16 LAB — TSH: TSH: 3.21 u[IU]/mL (ref 0.35–5.50)

## 2021-06-16 LAB — T4, FREE: Free T4: 0.81 ng/dL (ref 0.60–1.60)

## 2021-06-16 NOTE — Patient Instructions (Signed)
We are referring you to the heart doctor   GO TO THE LAB : Get the blood work     [place], [place] back for a checkup in 6 months   STOP BY THE FIRST FLOOR:  get the XR

## 2021-06-16 NOTE — Progress Notes (Signed)
Subjective:    Patient ID: Carol Wilson, female    DOB: September 14, 1946, 75 y.o.   MRN: [id number]  DOS:  06/16/2021 Type of visit - description: Follow-up On the last 6 months, she has noted difference on her stamina. Reported that she gets DOE when goes to pick up the mail or go upstairs.  Definitely different compared to few months ago.  No associated chest pain, palpitations. No change in medicines. Denies any viral syndrome prior to the onset of this symptoms.  Review of Systems No fever chills No lower extremity edema No cough or wheezing No weight loss or headaches.     Past Medical History:  Diagnosis Date   Anxiety    Colon polyps    Depression    started ~ 2008 after she lost a daughter   Familial tremor    HTN (hypertension)    well controlled with propranolol   Hyperlipidemia    Osteopenia    Rosacea    sees derm    Past Surgical History:  Procedure Laterality Date   APPENDECTOMY     lenses implant B 2018 Bilateral 2018   TUBAL LIGATION      Allergies as of 06/16/2021       Reactions   Sulfa Antibiotics Rash   RASH        Medication List        Accurate as of June 16, 2021 11:59 PM. If you have any questions, ask your nurse or doctor.          alendronate 70 MG tablet Commonly known as: FOSAMAX Take 1 tablet (70 mg total) by mouth every 7 (seven) days. Take with a full glass of water on an empty stomach. Remain upright for 30-45 mins.   ALPRAZolam 0.5 MG tablet Commonly known as: XANAX Take 0.5-1 tablets (0.25-0.5 mg total) by mouth at bedtime as needed for sleep.   ezetimibe-simvastatin 10-40 MG tablet Commonly known as: VYTORIN Take 1 tablet by mouth daily.   fluticasone 50 MCG/ACT nasal spray Commonly known as: FLONASE Place 2 sprays into both nostrils daily.   loperamide 2 MG capsule Commonly known as: IMODIUM Take 2 mg by mouth daily.   metroNIDAZOLE 1 % gel Commonly known as: METROGEL Apply 1 application topically  daily.   primidone 50 MG tablet Commonly known as: MYSOLINE Take 0.5 tablets (25 mg total) by mouth at bedtime.   propranolol 10 MG tablet Commonly known as: INDERAL Take 1 tablet (10 mg total) by mouth daily.   sertraline 100 MG tablet Commonly known as: ZOLOFT Take 1 tablet (100 mg total) by mouth daily.           Objective:   Physical Exam BP 120/70 (BP Location: Left Arm, Patient Position: Sitting, Cuff Size: Small)   Pulse (!) 57   Temp 98.3 F (36.8 C) (Oral)   Resp 16   Ht '5\' 6"'$  (1.676 m)   Wt 155 lb (70.3 kg)   SpO2 95%   BMI 25.02 kg/m  General:   Well developed, NAD, BMI noted. HEENT:  Normocephalic . Face symmetric, atraumatic Neck: No JVD Lungs:  CTA B Normal respiratory effort, no intercostal retractions, no accessory muscle use. Heart: RRR,  no murmur.  Lower extremities: no pretibial edema bilaterally  Skin: Not pale. Not jaundice Neurologic:  alert & oriented X3.  Speech normal, gait appropriate for age and unassisted Psych--  Cognition and judgment appear intact.  Cooperative with normal attention span and concentration.  Behavior appropriate. No anxious or depressed appearing.      Assessment     ASSESSMENT Hyperlipidemia  Depression , insomnia --onset 2008, lost a daughter Neurology: -Essential tremor- Dr Tat -Hyper reflexion Osteoporosis dexa: 2012, 2014, 01/14/2016: T score -0.7. 11/-2021 T score  (-) 2.4, rx fosamax  Rosacea   Sees dermatology Allergic rhinitis  Chronic Diarrhea: Saw GI, celiac w/u negative,  colonoscopy 01-2018, had polyps, biopsy was negative for colitis, trial with Colestid failed.   +FH CAD  PLAN: DOE: For the last 6 months, has noted a different on her stamina, gets dyspnea on exertion with task that typically she was able to do without problems. She does not seem volume overloaded, no chest pain or palpitations, no cough or fever. Last EKG: Sinus bradycardia. This could be multifactorial, possibly  related to bradycardia versus others.   Plan: Refer to cardiology, further eval?.   Chest x-ray, CBC. I am reluctant to stop the low-dose of propanolol because that helped significantly her essential tremor. Increased TSH: TFTs Depression insomnia: Well-controlled Vaccination: Has not changed her mind about COVID vaccines. RTC 6 months   This visit occurred during the SARS-CoV-2 public health emergency.  Safety protocols were in place, including screening questions prior to the visit, additional usage of staff PPE, and extensive cleaning of exam room while observing appropriate contact time as indicated for disinfecting solutions.

## 2021-06-17 NOTE — Assessment & Plan Note (Signed)
DOE: For the last 6 months, has noted a different on her stamina, gets dyspnea on exertion with task that typically she was able to do without problems. She does not seem volume overloaded, no chest pain or palpitations, no cough or fever. Last EKG: Sinus bradycardia. This could be multifactorial, possibly related to bradycardia versus others.   Plan: Refer to cardiology, further eval?.   Chest x-ray, CBC. I am reluctant to stop the low-dose of propanolol because that helped significantly her essential tremor. Increased TSH: TFTs Depression insomnia: Well-controlled Vaccination: Has not changed her mind about COVID vaccines. RTC 6 months

## 2021-06-22 ENCOUNTER — Ambulatory Visit: Payer: Medicare PPO | Admitting: Internal Medicine

## 2021-07-04 DIAGNOSIS — I10 Essential (primary) hypertension: Secondary | ICD-10-CM | POA: Diagnosis not present

## 2021-07-04 DIAGNOSIS — G252 Other specified forms of tremor: Secondary | ICD-10-CM | POA: Diagnosis not present

## 2021-07-04 DIAGNOSIS — F325 Major depressive disorder, single episode, in full remission: Secondary | ICD-10-CM | POA: Diagnosis not present

## 2021-07-04 DIAGNOSIS — E663 Overweight: Secondary | ICD-10-CM | POA: Diagnosis not present

## 2021-07-04 DIAGNOSIS — F419 Anxiety disorder, unspecified: Secondary | ICD-10-CM | POA: Diagnosis not present

## 2021-07-04 DIAGNOSIS — E785 Hyperlipidemia, unspecified: Secondary | ICD-10-CM | POA: Diagnosis not present

## 2021-07-04 DIAGNOSIS — J309 Allergic rhinitis, unspecified: Secondary | ICD-10-CM | POA: Diagnosis not present

## 2021-07-04 DIAGNOSIS — M8580 Other specified disorders of bone density and structure, unspecified site: Secondary | ICD-10-CM | POA: Diagnosis not present

## 2021-07-04 DIAGNOSIS — L719 Rosacea, unspecified: Secondary | ICD-10-CM | POA: Diagnosis not present

## 2021-07-28 ENCOUNTER — Other Ambulatory Visit: Payer: Self-pay

## 2021-07-28 MED ORDER — PRIMIDONE 50 MG PO TABS: 25.0000 mg | ORAL_TABLET | Freq: Every day | ORAL | 1 refills | Status: DC

## 2021-08-17 DIAGNOSIS — F419 Anxiety disorder, unspecified: Secondary | ICD-10-CM | POA: Insufficient documentation

## 2021-08-17 DIAGNOSIS — I10 Essential (primary) hypertension: Secondary | ICD-10-CM | POA: Insufficient documentation

## 2021-08-17 DIAGNOSIS — M8580 Other specified disorders of bone density and structure, unspecified site: Secondary | ICD-10-CM | POA: Insufficient documentation

## 2021-08-17 DIAGNOSIS — K635 Polyp of colon: Secondary | ICD-10-CM | POA: Insufficient documentation

## 2021-08-24 ENCOUNTER — Ambulatory Visit: Payer: Medicare PPO | Admitting: Cardiology

## 2021-08-24 ENCOUNTER — Other Ambulatory Visit: Payer: Self-pay

## 2021-08-24 ENCOUNTER — Encounter: Payer: Self-pay | Admitting: Cardiology

## 2021-08-24 VITALS — BP 124/76 | HR 60 | Ht 66.0 in | Wt 155.1 lb

## 2021-08-24 DIAGNOSIS — I10 Essential (primary) hypertension: Secondary | ICD-10-CM

## 2021-08-24 DIAGNOSIS — E782 Mixed hyperlipidemia: Secondary | ICD-10-CM | POA: Diagnosis not present

## 2021-08-24 DIAGNOSIS — R079 Chest pain, unspecified: Secondary | ICD-10-CM | POA: Diagnosis not present

## 2021-08-24 DIAGNOSIS — R0609 Other forms of dyspnea: Secondary | ICD-10-CM | POA: Diagnosis not present

## 2021-08-24 NOTE — Patient Instructions (Signed)
Medication Instructions:  Your physician recommends that you continue on your current medications as directed. Please refer to the Current Medication list given to you today.  *If you need a refill on your cardiac medications before your next appointment, please call your pharmacy*   Lab Work: None. If you have labs (blood work) drawn today and your tests are completely normal, you will receive your results only by: [place] (if you have MyChart) OR A paper copy in the mail If you have any lab test that is abnormal or we need to change your treatment, we will call you to review the results.   Testing/Procedures: Your physician has requested that you have an echocardiogram. Echocardiography is a painless test that uses sound waves to create images of your heart. It provides your doctor with information about the size and shape of your heart and how well your heart's chambers and valves are working. This procedure takes approximately one hour. There are no restrictions for this procedure.    [facility] at [facility] [address] [place], [place] [address] Phone: [phone]    Please arrive 15 minutes prior to your appointment time for registration and insurance purposes.  The test will take approximately 3 to 4 hours to complete; you may bring reading material.  If someone comes with you to your appointment, they will need to remain in the main lobby due to limited space in the testing area. **If you are pregnant or breastfeeding, please notify the nuclear lab prior to your appointment**  How to prepare for your Myocardial Perfusion Test: Do not eat or drink 3 hours prior to your test, except you may have water. Do not consume products containing caffeine (regular or decaffeinated) 12 hours prior to your test. (ex: coffee, chocolate, sodas, tea). Do bring a list of your current medications with you.  If not listed below, you may take  your medications as normal.  Do wear comfortable clothes (no dresses or overalls) and walking shoes, tennis shoes preferred (No heels or open toe shoes are allowed). Do NOT wear cologne, perfume, aftershave, or lotions (deodorant is allowed). If these instructions are not followed, your test will have to be rescheduled.  Please report to [address] for your test.  If you have questions or concerns about your appointment, you can call the Nuclear Lab at [phone].  If you cannot keep your appointment, please provide 24 hours notification to the Nuclear Lab, to avoid a possible $50 charge to your account.   Follow-Up: At [facility], you and your health needs are our priority.  As part of our continuing mission to provide you with exceptional heart care, we have created designated Provider Care Teams.  These Care Teams include your primary Cardiologist (physician) and Advanced Practice Providers (APPs -  Physician Assistants and Nurse Practitioners) who all work together to provide you with the care you need, when you need it.  We recommend signing up for the patient portal called "MyChart".  Sign up information is provided on this After Visit Summary.  MyChart is used to connect with patients for Virtual Visits (Telemedicine).  Patients are able to view lab/test results, encounter notes, upcoming appointments, etc.  Non-urgent messages can be sent to your provider as well.   To learn more about what you can do with MyChart, go to [email or web address].    Your next appointment:   2 month(s)  The format for your next appointment:  In Person  Provider:   Jenne Campus, MD   Other Instructions  Echocardiogram An echocardiogram is a test that uses sound waves (ultrasound) to produce images of the heart. Images from an echocardiogram can provide important information about: Heart size and shape. The size and thickness and movement of your heart's  walls. Heart muscle function and strength. Heart valve function or if you have stenosis. Stenosis is when the heart valves are too narrow. If blood is flowing backward through the heart valves (regurgitation). A tumor or infectious growth around the heart valves. Areas of heart muscle that are not working well because of poor blood flow or injury from a heart attack. Aneurysm detection. An aneurysm is a weak or damaged part of an artery wall. The wall bulges out from the normal force of blood pumping through the body. Tell a health care provider about: Any allergies you have. All medicines you are taking, including vitamins, herbs, eye drops, creams, and over-the-counter medicines. Any blood disorders you have. Any surgeries you have had. Any medical conditions you have. Whether you are pregnant or may be pregnant. What are the risks? Generally, this is a safe test. However, problems may occur, including an allergic reaction to dye (contrast) that may be used during the test. What happens before the test? No specific preparation is needed. You may eat and drink normally. What happens during the test?  You will take off your clothes from the waist up and put on a hospital gown. Electrodes or electrocardiogram (ECG)patches may be placed on your chest. The electrodes or patches are then connected to a device that monitors your heart rate and rhythm. You will lie down on a table for an ultrasound exam. A gel will be applied to your chest to help sound waves pass through your skin. A handheld device, called a transducer, will be pressed against your chest and moved over your heart. The transducer produces sound waves that travel to your heart and bounce back (or "echo" back) to the transducer. These sound waves will be captured in real-time and changed into images of your heart that can be viewed on a video monitor. The images will be recorded on a computer and reviewed by your health care  provider. You may be asked to change positions or hold your breath for a short time. This makes it easier to get different views or better views of your heart. In some cases, you may receive contrast through an IV in one of your veins. This can improve the quality of the pictures from your heart. The procedure may vary among health care providers and hospitals. What can I expect after the test? You may return to your normal, everyday life, including diet, activities, and medicines, unless your health care provider tells you not to do that. Follow these instructions at home: It is up to you to get the results of your test. Ask your health care provider, or the department that is doing the test, when your results will be ready. Keep all follow-up visits. This is important. Summary An echocardiogram is a test that uses sound waves (ultrasound) to produce images of the heart. Images from an echocardiogram can provide important information about the size and shape of your heart, heart muscle function, heart valve function, and other possible heart problems. You do not need to do anything to prepare before this test. You may eat and drink normally. After the echocardiogram is completed, you may return to your normal, everyday life,  unless your health care provider tells you not to do that. This information is not intended to replace advice given to you by your health care provider. Make sure you discuss any questions you have with your health care provider. Document Revised: 07/01/2020 Document Reviewed: 07/01/2020 Elsevier Patient Education  2022 [place].  Cardiac Nuclear Scan A cardiac nuclear scan is a test that measures blood flow to the heart when a person is resting and when he or she is exercising. The test looks for problems such as: Not enough blood reaching a portion of the heart. The heart muscle not working normally. You may need this test if: You have heart disease. You have had  abnormal lab results. You have had heart surgery or a balloon procedure to open up blocked arteries (angioplasty). You have chest pain. You have shortness of breath. In this test, a radioactive dye (tracer) is injected into your bloodstream. After the tracer has traveled to your heart, an imaging device is used to measure how much of the tracer is absorbed by or distributed to various areas of your heart. This procedure is usually done at a hospital and takes 2-4 hours. Tell a health care provider about: Any allergies you have. All medicines you are taking, including vitamins, herbs, eye drops, creams, and over-the-counter medicines. Any problems you or family members have had with anesthetic medicines. Any blood disorders you have. Any surgeries you have had. Any medical conditions you have. Whether you are pregnant or may be pregnant. What are the risks? Generally, this is a safe procedure. However, problems may occur, including: Serious chest pain and heart attack. This is only a risk if the stress portion of the test is done. Rapid heartbeat. Sensation of warmth in your chest. This usually passes quickly. Allergic reaction to the tracer. What happens before the procedure? Ask your health care provider about changing or stopping your regular medicines. This is especially important if you are taking diabetes medicines or blood thinners. Follow instructions from your health care provider about eating or drinking restrictions. Remove your jewelry on the day of the procedure. What happens during the procedure? An IV will be inserted into one of your veins. Your health care provider will inject a small amount of radioactive tracer through the IV. You will wait for 20-40 minutes while the tracer travels through your bloodstream. Your heart activity will be monitored with an electrocardiogram (ECG). You will lie down on an exam table. Images of your heart will be taken for about 15-20  minutes. You may also have a stress test. For this test, one of the following may be done: You will exercise on a treadmill or stationary bike. While you exercise, your heart's activity will be monitored with an ECG, and your blood pressure will be checked. You will be given medicines that will increase blood flow to parts of your heart. This is done if you are unable to exercise. When blood flow to your heart has peaked, a tracer will again be injected through the IV. After 20-40 minutes, you will get back on the exam table and have more images taken of your heart. Depending on the type of tracer used, scans may need to be repeated 3-4 hours later. Your IV line will be removed when the procedure is over. The procedure may vary among health care providers and hospitals. What happens after the procedure? Unless your health care provider tells you otherwise, you may return to your normal schedule, including diet, activities,  and medicines. Unless your health care provider tells you otherwise, you may increase your fluid intake. This will help to flush the contrast dye from your body. Drink enough fluid to keep your urine pale yellow. Ask your health care provider, or the department that is doing the test: When will my results be ready? How will I get my results? Summary A cardiac nuclear scan measures the blood flow to the heart when a person is resting and when he or she is exercising. Tell your health care provider if you are pregnant. Before the procedure, ask your health care provider about changing or stopping your regular medicines. This is especially important if you are taking diabetes medicines or blood thinners. After the procedure, unless your health care provider tells you otherwise, increase your fluid intake. This will help flush the contrast dye from your body. After the procedure, unless your health care provider tells you otherwise, you may return to your normal schedule, including  diet, activities, and medicines. This information is not intended to replace advice given to you by your health care provider. Make sure you discuss any questions you have with your health care provider. Document Revised: 04/24/2018 Document Reviewed: 04/24/2018 Elsevier Patient Education  [place].

## 2021-08-24 NOTE — Progress Notes (Signed)
Cardiology Consultation:    Date:  08/24/2021   ID:  ASANI DENISTON, DOB [birth year], MRN [id number]  PCP:  Colon Branch, MD  Cardiologist:  Jenne Campus, MD   Referring MD: Colon Branch, MD   No chief complaint on file. I am short of breath  History of Present Illness:    Carol Wilson is a 75 y.o. female who is being seen today for the evaluation of dyspnea on exertion at the request of Colon Branch, MD. with past medical history significant for essential hypertension, dyslipidemia, treated, essential tremor, family history of premature coronary artery disease.  She comes today to my office to be establish as a patient and also talk about her symptomatology.  She has been complaining of having shortness of breath that been progressive problem going for about 6 months.  She said when she walk upstairs she gets short of breath also when she is doing her chores at home meaning vacuuming she will get short of breath it was not the case before.  Still trying to be active does do some senior exercises twice a week denies have any chest pain tightness squeezing pressure burning chest jaw shortness of breath.  There is no swelling of lower extremities there is no proximal nocturnal dyspnea.  She snores at night however she is not sure if she stop breathing at night.  She never smoked she does take cholesterol medication she is not on any special diet she try to exercise twice a week in senior center does have family history of multiple family members with coronary artery disease premature.  That is why she is worried about this symptomatology.  Past Medical History:  Diagnosis Date   Anxiety    Colon polyps    Depression    started ~ 2008 after she lost a daughter   Familial tremor    HTN (hypertension)    well controlled with propranolol   Hyperlipidemia    Osteopenia    Rosacea    sees derm    Past Surgical History:  Procedure Laterality Date   APPENDECTOMY     lenses implant B  2018 Bilateral 2018   TUBAL LIGATION      Current Medications: Current Meds  Medication Sig   alendronate (FOSAMAX) 70 MG tablet Take 1 tablet (70 mg total) by mouth every 7 (seven) days. Take with a full glass of water on an empty stomach. Remain upright for 30-45 mins.   ALPRAZolam (XANAX) 0.5 MG tablet Take 0.5-1 tablets (0.25-0.5 mg total) by mouth at bedtime as needed for sleep.   ezetimibe-simvastatin (VYTORIN) 10-40 MG tablet Take 1 tablet by mouth daily.   fluticasone (FLONASE) 50 MCG/ACT nasal spray Place 2 sprays into both nostrils daily. (Patient taking differently: 1 spray daily as needed for allergies.)   loperamide (IMODIUM) 2 MG capsule Take 2 mg by mouth daily.   metroNIDAZOLE (METROGEL) 1 % gel Apply 1 application topically daily.   primidone (MYSOLINE) 50 MG tablet Take 0.5 tablets (25 mg total) by mouth at bedtime.   propranolol (INDERAL) 10 MG tablet Take 1 tablet (10 mg total) by mouth daily.   sertraline (ZOLOFT) 100 MG tablet Take 1 tablet (100 mg total) by mouth daily.     Allergies:   Sulfa antibiotics   Social History   Socioeconomic History   Marital status: Married    Spouse name: Not on file   Number of children: 2   Years of education: Not  on file   Highest education level: Not on file  Occupational History   Occupation: retired-- cooks for the school    Employer: [employer]  Tobacco Use   Smoking status: Never   Smokeless tobacco: Never  Substance and Sexual Activity   Alcohol use: Yes    Comment: once a month   Drug use: No   Sexual activity: Not Currently  Other Topics Concern   Not on file  Social History Narrative   Very active   Household: pt, husband, g-son lives there part time      lost her only daughter to lung cancer ~ 2008, lost a 72 old month baby years ago , no living children, takes care of  his [place], 75 y/o   Social Determinants of Radio broadcast assistant Strain: Not on file  Food Insecurity: Not on file   Transportation Needs: Not on file  Physical Activity: Not on file  Stress: Not on file  Social Connections: Not on file     Family History: The patient's family history includes Alcohol abuse in her brother; Coronary artery disease in her mother; Heart attack in her father; Lung cancer in her daughter; Tuberculosis in her daughter. There is no history of Diabetes, Hypertension, Stroke, Colon cancer, or Breast cancer. ROS:   Please see the history of present illness.    All 14 point review of systems negative except as described per history of present illness.  EKGs/Labs/Other Studies Reviewed:    The following studies were reviewed today:   EKG:  EKG is  ordered today.  The ekg ordered today demonstrates normal sinus rhythm, normal P interval, normal QS complex duration fulgent no ST segment changes  Recent Labs: 02/19/2021: ALT 13; BUN 18; Creatinine, Ser 0.87; Potassium 4.5; Sodium 140 06/16/2021: Hemoglobin 11.8; Platelets 290.0; TSH 3.21  Recent Lipid Panel    Component Value Date/Time   CHOL 181 02/19/2021 0858   TRIG 91.0 02/19/2021 0858   HDL 65.30 02/19/2021 0858   CHOLHDL 3 02/19/2021 0858   VLDL 18.2 02/19/2021 0858   LDLCALC 97 02/19/2021 0858   LDLDIRECT 124.3 05/09/2013 0817    Physical Exam:    VS:  BP 124/76 (BP Location: Right Arm, Patient Position: Sitting, Cuff Size: Normal)   Pulse 60   Ht 5\' 6"  (1.676 m)   Wt 155 lb 1.9 oz (70.4 kg)   SpO2 99%   BMI 25.04 kg/m     Wt Readings from Last 3 Encounters:  08/24/21 155 lb 1.9 oz (70.4 kg)  06/16/21 155 lb (70.3 kg)  02/19/21 156 lb 4 oz (70.9 kg)     GEN:  Well nourished, well developed in no acute distress HEENT: Normal NECK: No JVD; No carotid bruits LYMPHATICS: No lymphadenopathy CARDIAC: RRR, no murmurs, no rubs, no gallops RESPIRATORY:  Clear to auscultation without rales, wheezing or rhonchi  ABDOMEN: Soft, non-tender, non-distended MUSCULOSKELETAL:  No edema; No deformity  SKIN: Warm and  dry NEUROLOGIC:  Alert and oriented x 3 PSYCHIATRIC:  Normal affect   ASSESSMENT:    1. Primary hypertension   2. Dyspnea on exertion   3. Mixed hyperlipidemia    PLAN:    In order of problems listed above:  Dyspnea on exertion which is concerning.  I will schedule him to have echocardiogram to assess left ventricular systolic as well as diastolic function.  Obviously we will also look at the pulmonary pressure.  I am worried that she may have angina equivalent, therefore,  I will schedule her to have a stress test.  Also asked her to start taking 1 baby aspirin every single day we did calculated her 10 years predicted risk for having heart problem which is 17.3 this is intermediate.  In this situation aspirin is advisable. Dyslipidemia she is taking Vytorin 10/40 I did review K PN which show me data from March with LDL of 97 HDL 65.  We will make future recommendation regarding that issue after we do stress test. Essential hypertension seems to well controlled continue present management. Essential tremor that be follow-up by neurology.  She suspect may be some symptomatology is related to medication for her tremor. We did talk about healthy lifestyle need to exercise on the regular basis which she understand tolerated.   Medication Adjustments/Labs and Tests Ordered: Current medicines are reviewed at length with the patient today.  Concerns regarding medicines are outlined above.  No orders of the defined types were placed in this encounter.  No orders of the defined types were placed in this encounter.   Signed, Park Liter, MD, [facility]. 08/24/2021 9:11 AM    [facility]

## 2021-08-26 ENCOUNTER — Telehealth (HOSPITAL_COMMUNITY): Payer: Self-pay

## 2021-08-26 NOTE — Telephone Encounter (Signed)
Spoke with the patient, detailed instructions given. She stated that she would be here for her test. Asked to call back with any questions. S.Takeila Thayne EMTP 

## 2021-09-01 ENCOUNTER — Other Ambulatory Visit: Payer: Self-pay

## 2021-09-01 ENCOUNTER — Ambulatory Visit (HOSPITAL_COMMUNITY): Payer: Medicare PPO | Attending: Cardiology

## 2021-09-01 DIAGNOSIS — I10 Essential (primary) hypertension: Secondary | ICD-10-CM | POA: Diagnosis not present

## 2021-09-01 DIAGNOSIS — R0609 Other forms of dyspnea: Secondary | ICD-10-CM | POA: Diagnosis not present

## 2021-09-01 DIAGNOSIS — R079 Chest pain, unspecified: Secondary | ICD-10-CM | POA: Diagnosis not present

## 2021-09-01 DIAGNOSIS — E782 Mixed hyperlipidemia: Secondary | ICD-10-CM | POA: Diagnosis not present

## 2021-09-01 LAB — MYOCARDIAL PERFUSION IMAGING
Estimated workload: 7.8
Exercise duration (min): 6 min
LV dias vol: 63 mL (ref 46–106)
LV sys vol: 15 mL
MPHR: 145 {beats}/min
Nuc Stress EF: 76 %
Peak HR: 123 {beats}/min
Percent HR: 85 %
RPE: 20
Rest HR: 60 {beats}/min
Rest Nuclear Isotope Dose: 10.9 mCi
SDS: 0
SRS: 0
SSS: 0
ST Depression (mm): 0 mm
Stress Nuclear Isotope Dose: 31 mCi
TID: 1

## 2021-09-01 MED ORDER — TECHNETIUM TC 99M TETROFOSMIN IV KIT
31.0000 | PACK | Freq: Once | INTRAVENOUS | Status: AC | PRN
  Administered 2021-09-01: 31 via INTRAVENOUS
  Filled 2021-09-01: qty 31

## 2021-09-01 MED ORDER — REGADENOSON 0.4 MG/5ML IV SOLN
0.4000 mg | Freq: Once | INTRAVENOUS | Status: AC
  Administered 2021-09-01: 0.4 mg via INTRAVENOUS

## 2021-09-01 MED ORDER — TECHNETIUM TC 99M TETROFOSMIN IV KIT
10.9000 | PACK | Freq: Once | INTRAVENOUS | Status: AC | PRN
  Administered 2021-09-01: 10.9 via INTRAVENOUS
  Filled 2021-09-01: qty 11

## 2021-09-04 ENCOUNTER — Other Ambulatory Visit: Payer: Self-pay

## 2021-09-04 ENCOUNTER — Ambulatory Visit (HOSPITAL_BASED_OUTPATIENT_CLINIC_OR_DEPARTMENT_OTHER)
Admission: RE | Admit: 2021-09-04 | Discharge: 2021-09-04 | Disposition: A | Discharge status: Home / Self Care | Payer: Medicare PPO | Source: Ambulatory Visit | Attending: Cardiology | Admitting: Cardiology

## 2021-09-04 DIAGNOSIS — R0609 Other forms of dyspnea: Secondary | ICD-10-CM | POA: Diagnosis not present

## 2021-09-04 DIAGNOSIS — E782 Mixed hyperlipidemia: Secondary | ICD-10-CM

## 2021-09-04 DIAGNOSIS — I10 Essential (primary) hypertension: Secondary | ICD-10-CM | POA: Diagnosis not present

## 2021-09-04 DIAGNOSIS — R079 Chest pain, unspecified: Secondary | ICD-10-CM

## 2021-09-04 LAB — ECHOCARDIOGRAM COMPLETE
AR max vel: 2.12 cm2
AV Area VTI: 2.02 cm2
AV Area mean vel: 1.96 cm2
AV Mean grad: 6 mmHg
AV Peak grad: 11 mmHg
Ao pk vel: 1.66 m/s
Area-P 1/2: 2.81 cm2
Calc EF: 61 %
P 1/2 time: 717 msec
S' Lateral: 3.2 cm
Single Plane A2C EF: 62.6 %
Single Plane A4C EF: 61.6 %

## 2021-09-08 ENCOUNTER — Telehealth: Payer: Self-pay

## 2021-09-08 NOTE — Telephone Encounter (Signed)
-----   Message from Park Liter, MD sent at 09/08/2021  2:27 PM EDT ----- Echocardiogram showed preserved left ventricle ejection fraction, mild left ventricle hypertrophy, overall looks good

## 2021-09-08 NOTE — Telephone Encounter (Signed)
Called patient. Patient made aware of the results. Verbalized understanding. No questions or concerns expressed at this time.

## 2021-09-30 ENCOUNTER — Telehealth: Payer: Self-pay | Admitting: Internal Medicine

## 2021-09-30 NOTE — Telephone Encounter (Signed)
Left message for patient to call back and schedule Medicare Annual Wellness Visit (AWV) in office.   If not able to come in office, please offer to do virtually or by telephone.  Left office number and my jabber [phone].  Last AWV:02/08/2020  Please schedule at anytime with Nurse Health Advisor.

## 2021-10-08 ENCOUNTER — Other Ambulatory Visit: Payer: Self-pay | Admitting: Internal Medicine

## 2021-10-13 ENCOUNTER — Other Ambulatory Visit: Payer: Self-pay | Admitting: Internal Medicine

## 2021-10-13 DIAGNOSIS — Z1231 Encounter for screening mammogram for malignant neoplasm of breast: Secondary | ICD-10-CM

## 2021-11-10 ENCOUNTER — Ambulatory Visit: Payer: Medicare PPO | Admitting: Cardiology

## 2021-11-10 ENCOUNTER — Other Ambulatory Visit: Payer: Self-pay

## 2021-11-10 ENCOUNTER — Encounter: Payer: Self-pay | Admitting: Cardiology

## 2021-11-10 VITALS — BP 108/58 | HR 64 | Ht 66.0 in | Wt 158.0 lb

## 2021-11-10 DIAGNOSIS — R0609 Other forms of dyspnea: Secondary | ICD-10-CM | POA: Diagnosis not present

## 2021-11-10 DIAGNOSIS — E782 Mixed hyperlipidemia: Secondary | ICD-10-CM | POA: Diagnosis not present

## 2021-11-10 DIAGNOSIS — G250 Essential tremor: Secondary | ICD-10-CM | POA: Diagnosis not present

## 2021-11-10 NOTE — Patient Instructions (Signed)
Medication Instructions:  Your physician recommends that you continue on your current medications as directed. Please refer to the Current Medication list given to you today.  *If you need a refill on your cardiac medications before your next appointment, please call your pharmacy*   Lab Work: None ordered If you have labs (blood work) drawn today and your tests are completely normal, you will receive your results only by: [place] (if you have MyChart) OR A paper copy in the mail If you have any lab test that is abnormal or we need to change your treatment, we will call you to review the results.   Testing/Procedures: None ordered   Follow-Up: At [facility], you and your health needs are our priority.  As part of our continuing mission to provide you with exceptional heart care, we have created designated Provider Care Teams.  These Care Teams include your primary Cardiologist (physician) and Advanced Practice Providers (APPs -  Physician Assistants and Nurse Practitioners) who all work together to provide you with the care you need, when you need it.  We recommend signing up for the patient portal called "MyChart".  Sign up information is provided on this After Visit Summary.  MyChart is used to connect with patients for Virtual Visits (Telemedicine).  Patients are able to view lab/test results, encounter notes, upcoming appointments, etc.  Non-urgent messages can be sent to your provider as well.   To learn more about what you can do with MyChart, go to [email or web address].    Your next appointment:   3-4 month(s)  The format for your next appointment:   In Person  Provider:   Jenne Campus, MD   Other Instructions NA

## 2021-11-10 NOTE — Progress Notes (Signed)
Cardiology Office Note:    Date:  11/10/2021   ID:  Carol Wilson, DOB Feb 13, 1946, MRN [id number]  PCP:  Colon Branch, MD  Cardiologist:  Jenne Campus, MD    Referring MD: Colon Branch, MD   Chief Complaint  Patient presents with   Follow-up  Still have shortness of breath  History of Present Illness:    Carol Wilson is a 75 y.o. female with past medical history significant for essential hypertension, anxiety, depression, dyslipidemia, tremor.  She was referred to [place] because of exertional shortness of breath.  She did have echocardiogram performed which showed preserved left ventricle ejection fraction, she had grade 1 diastolic dysfunctions, pulmonary pressure was normal.  She also had a stress test which shows no evidence of ischemia still have no cardiac explanation for her dyspnea on exertion.  We had a long discussion about what to do with the situation that her neurologist told her it may be related to medication she takes for her tremor which is Inderal as well as primidone.  I told her she may try to stop this medication one by one for about a week each and see if that helps any.  In the meantime I will refer her to pulmonary.  She probably required CT of the chest as well as pulmonary function test.  Past Medical History:  Diagnosis Date   Anxiety    Colon polyps    Depression    started ~ 2008 after she lost a daughter   Familial tremor    HTN (hypertension)    well controlled with propranolol   Hyperlipidemia    Osteopenia    Rosacea    sees derm    Past Surgical History:  Procedure Laterality Date   APPENDECTOMY     lenses implant B 2018 Bilateral 2018   TUBAL LIGATION      Current Medications: Current Meds  Medication Sig   alendronate (FOSAMAX) 70 MG tablet Take 1 tablet (70 mg total) by mouth every 7 (seven) days. Take with a full glass of water on an empty stomach. Remain upright for 30-45 mins.   ALPRAZolam (XANAX) 0.5 MG tablet Take 0.5-1  tablets (0.25-0.5 mg total) by mouth at bedtime as needed for sleep.   ezetimibe-simvastatin (VYTORIN) 10-40 MG tablet Take 1 tablet by mouth daily.   fluticasone (FLONASE) 50 MCG/ACT nasal spray Place 2 sprays into both nostrils daily.   loperamide (IMODIUM) 2 MG capsule Take 2 mg by mouth daily.   metroNIDAZOLE (METROGEL) 1 % gel Apply 1 application topically daily.   primidone (MYSOLINE) 50 MG tablet Take 0.5 tablets (25 mg total) by mouth at bedtime.   propranolol (INDERAL) 10 MG tablet Take 10 mg by mouth daily.   sertraline (ZOLOFT) 100 MG tablet Take 100 mg by mouth daily.   [DISCONTINUED] propranolol (INDERAL) 10 MG tablet TAKE 1 TABLET EVERY DAY     Allergies:   Sulfa antibiotics   Social History   Socioeconomic History   Marital status: Married    Spouse name: Not on file   Number of children: 2   Years of education: Not on file   Highest education level: Not on file  Occupational History   Occupation: retired-- cooks for the school    Employer: [employer]  Tobacco Use   Smoking status: Never   Smokeless tobacco: Never  Substance and Sexual Activity   Alcohol use: Yes    Comment: once a month   Drug use:  No   Sexual activity: Not Currently  Other Topics Concern   Not on file  Social History Narrative   Very active   Household: pt, husband, g-son lives there part time      lost her only daughter to lung cancer ~ 2008, lost a 44 old month baby years ago , no living children, takes care of  his [place], 74 y/o   Social Determinants of Radio broadcast assistant Strain: Not on file  Food Insecurity: Not on file  Transportation Needs: Not on file  Physical Activity: Not on file  Stress: Not on file  Social Connections: Not on file     Family History: The patient's family history includes Alcohol abuse in her brother; Coronary artery disease in her mother; Heart attack in her father; Lung cancer in her daughter; Tuberculosis in her daughter. There is no  history of Diabetes, Hypertension, Stroke, Colon cancer, or Breast cancer. ROS:   Please see the history of present illness.    All 14 point review of systems negative except as described per history of present illness  EKGs/Labs/Other Studies Reviewed:      Recent Labs: 02/19/2021: ALT 13; BUN 18; Creatinine, Ser 0.87; Potassium 4.5; Sodium 140 06/16/2021: Hemoglobin 11.8; Platelets 290.0; TSH 3.21  Recent Lipid Panel    Component Value Date/Time   CHOL 181 02/19/2021 0858   TRIG 91.0 02/19/2021 0858   HDL 65.30 02/19/2021 0858   CHOLHDL 3 02/19/2021 0858   VLDL 18.2 02/19/2021 0858   LDLCALC 97 02/19/2021 0858   LDLDIRECT 124.3 05/09/2013 0817    Physical Exam:    VS:  BP (!) 108/58 (BP Location: Right Arm, Patient Position: Sitting)    Pulse 64    Ht 5\' 6"  (1.676 m)    Wt 158 lb (71.7 kg)    SpO2 98%    BMI 25.50 kg/m     Wt Readings from Last 3 Encounters:  11/10/21 158 lb (71.7 kg)  09/01/21 155 lb (70.3 kg)  08/24/21 155 lb 1.9 oz (70.4 kg)     GEN:  Well nourished, well developed in no acute distress HEENT: Normal NECK: No JVD; No carotid bruits LYMPHATICS: No lymphadenopathy CARDIAC: RRR, no murmurs, no rubs, no gallops RESPIRATORY:  Clear to auscultation without rales, wheezing or rhonchi  ABDOMEN: Soft, non-tender, non-distended MUSCULOSKELETAL:  No edema; No deformity  SKIN: Warm and dry LOWER EXTREMITIES: no swelling NEUROLOGIC:  Alert and oriented x 3 PSYCHIATRIC:  Normal affect   ASSESSMENT:    1. Dyspnea on exertion   2. Mixed hyperlipidemia   3. Essential tremor    PLAN:    In order of problems listed above:  Dyspnea and exertion still unclear etiology.  So far cardiac work-up is negative.  She will be referred to pulmonary however if pulmonary work-up is negative we will may return to looking at her heart more carefully.  She may require coronary CT angio may be even cardiac catheterization with measurements of the pressure on the right side of  her heart.  However echocardiogram did not show any evidence of pulm hypertension. Mixed dyslipidemia I did review K PN which show me LDL 97 HDL 65.  We will continue present management. Essential tremor that being follow-up by neurology.   Medication Adjustments/Labs and Tests Ordered: Current medicines are reviewed at length with the patient today.  Concerns regarding medicines are outlined above.  No orders of the defined types were placed in this encounter.  Medication changes:  No orders of the defined types were placed in this encounter.   Signed, Park Liter, MD, [facility] 11/10/2021 8:58 AM    [place]

## 2021-11-17 ENCOUNTER — Ambulatory Visit
Admission: RE | Admit: 2021-11-17 | Discharge: 2021-11-17 | Disposition: A | Discharge status: Home / Self Care | Payer: Medicare PPO | Source: Ambulatory Visit | Attending: Internal Medicine | Admitting: Internal Medicine

## 2021-11-17 DIAGNOSIS — Z1231 Encounter for screening mammogram for malignant neoplasm of breast: Secondary | ICD-10-CM

## 2021-11-30 ENCOUNTER — Encounter: Payer: Self-pay | Admitting: Internal Medicine

## 2021-11-30 ENCOUNTER — Other Ambulatory Visit: Payer: Self-pay

## 2021-11-30 ENCOUNTER — Ambulatory Visit (INDEPENDENT_AMBULATORY_CARE_PROVIDER_SITE_OTHER): Payer: Medicare HMO | Admitting: Internal Medicine

## 2021-11-30 VITALS — BP 140/70 | HR 65 | Temp 98.1°F | Ht 66.0 in | Wt 160.4 lb

## 2021-11-30 DIAGNOSIS — R0602 Shortness of breath: Secondary | ICD-10-CM

## 2021-11-30 DIAGNOSIS — Z7722 Contact with and (suspected) exposure to environmental tobacco smoke (acute) (chronic): Secondary | ICD-10-CM

## 2021-11-30 NOTE — Progress Notes (Signed)
Carol Wilson    [id number]    [birth year]  Primary Care Physician:Paz, Alda Berthold, MD  Referring Physician: Park Liter, MD [address] [place],  [place] [address] Reason for Consultation: shortness of breath Date of Consultation: 11/30/2021  Chief complaint:   Chief Complaint  Patient presents with   Consult    Carol Wilson      HPI: Carol Wilson is a 76 y.o. woman with past medical history of essential tremor, osteoporosis, HTN and HLD.  She is here for shortness of breath with exertion for the last the last 6-9 months and has been staying about the same since then. She is short of breath going up stairs in her house, as well as going out to her mailbox, vacuuming. No prior history of dyspnea. No childhood respiratory disease.   One daughter died at 2 months from tuberculosis.  Another daughter died at 35 from lung cancer, was a smoker  She denies prior history of pna or bronchitis.   She does cough sometimes which is dry and worse at night when she wakes up coughing. No wheezing.   Never been prescribed any inhalers.   She exercises twice a week where they walk two miles at a brisk pace (in 45 minutes) and she is able to do just fine.   She has seen cardiology and has had echocardiogram which showed mild LVH and garde 1 diastolic dysfunction but otherwise is unremarkable.   She is on propranolol and primidone for essential tremor for the last 2-3 years. The propranolol she has been on for 20-30 years for migraine and tremor.   She has frequent nocturnal awakenings for nocturia. She thinks she has anxiety attacks which seem to worsen this. Generalized anxiety about life in general.    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   Exposures: lives at home with husband, no pets.  Smoking history: never smoker, passive smoke exposure in spouse and still ongoing. Also in parents  as a child.   Social History   Occupational History   Occupation: retired-- cooks for the school    Employer: [employer]  Tobacco Use   Smoking status: Never   Smokeless tobacco: Never  Substance and Sexual Activity   Alcohol use: Yes    Comment: once a month   Drug use: No   Sexual activity: Not Currently    Relevant family history:  Family History  Problem Relation Age of Onset   Lung cancer Daughter    Coronary artery disease Mother        M 58y/o and F 68y/o   Heart attack Father    Alcohol abuse Brother    Tuberculosis Daughter    Diabetes Neg Hx    Hypertension Neg Hx    Stroke Neg Hx    Colon cancer Neg Hx    Breast cancer Neg Hx     Past Medical History:  Diagnosis Date   Anxiety    Colon polyps    Depression    started ~ 2008 after she lost a daughter   Familial tremor    HTN (hypertension)    well controlled with propranolol   Hyperlipidemia    Osteopenia    Rosacea    sees derm    Past Surgical History:  Procedure Laterality Date   APPENDECTOMY     lenses  implant B 2018 Bilateral 2018   TUBAL LIGATION       Physical Exam: Blood pressure 140/70, pulse 65, temperature 98.1 F (36.7 C), temperature source Oral, height 5\' 6"  (1.676 m), weight 160 lb 6.4 oz (72.8 kg), SpO2 97 %. Gen:      No acute distress ENT:  no nasal polyps, mucus membranes moist Lungs:    No increased respiratory effort, symmetric chest wall excursion, clear to auscultation bilaterally, no wheezes or crackles CV:         Regular rate and rhythm; no murmurs, rubs, or gallops.  No pedal edema Abd:      + bowel sounds; soft, non-tender; no distension MSK: no acute synovitis of DIP or PIP joints, no mechanics hands.  Skin:      Warm and dry; no rashes Neuro: normal speech, no focal facial asymmetry, resting tremor Psych: alert and oriented x3, normal mood and affect   Data Reviewed/Medical Decision Making:  Independent interpretation of tests: Imaging:  Review of  patient's chest xray images July 2022 revealed no acute process. The patient's images have been independently reviewed by me.    PFTs: No flowsheet data found.  Labs:  Lab Results  Component Value Date   WBC 4.4 06/16/2021   HGB 11.8 (L) 06/16/2021   HCT 36.8 06/16/2021   MCV 80.9 06/16/2021   PLT 290.0 06/16/2021   Lab Results  Component Value Date   NA 140 02/19/2021   K 4.5 02/19/2021   CL 104 02/19/2021   CO2 32 02/19/2021     Immunization status:  Immunization History  Administered Date(s) Administered   Fluad Quad(high Dose 65+) 08/28/2020   H1N1 11/07/2008   Influenza Whole 08/22/2009   Influenza, High Dose Seasonal PF 08/16/2013, 08/13/2016, 08/18/2017, 08/16/2018, 08/22/2019   Influenza,inj,Quad PF,6+ Mos 08/06/2014, 08/13/2015   Pneumococcal Conjugate-13 08/06/2014   Pneumococcal Polysaccharide-23 06/08/2011   Td 02/22/2007, 08/18/2017   Zoster, Live 12/15/2009     I reviewed prior external note(s) from cardiology, pcp  I reviewed the result(s) of the labs and imaging as noted above.   I have ordered PFT  Assessment:  Carol Wilson is a 76 y.o. woman who presents with:  Shortness of breath Essential Tremor Passive smoke exposure  Plan/Recommendations: Differential diagnosis includes dyspnea secondary to passive smoke exposure causing asthma/copd symptoms. Also consideration for side effects of BB, but she has been on this for many years so seems less likely.  Less likely deconditioning given her daily activity level. Will obtain PFTs and follow up afterwards. No need for additional chest imgaing at this time given reassuring chest xray.    We discussed disease management and progression at length today.    Return to Care: Return in about 4 weeks (around 12/28/2021).  Lenice Llamas, MD Pulmonary and [place]  CC: Park Liter, MD

## 2021-11-30 NOTE — Patient Instructions (Addendum)
Please schedule follow up scheduled with myself in 1 months.  If my schedule is not open yet, we will contact you with a reminder closer to that time. Please call [phone] if you haven't heard from [place] a month before.   Before your next visit I would like you to have:  Full set of PFTs - 1 hour, follow up with me afterwards same day.

## 2021-12-18 ENCOUNTER — Ambulatory Visit: Payer: Medicare PPO | Admitting: Internal Medicine

## 2021-12-21 ENCOUNTER — Other Ambulatory Visit: Payer: Self-pay | Admitting: Internal Medicine

## 2022-01-04 ENCOUNTER — Ambulatory Visit (INDEPENDENT_AMBULATORY_CARE_PROVIDER_SITE_OTHER): Payer: Medicare HMO

## 2022-01-04 VITALS — Ht 66.0 in | Wt 160.0 lb

## 2022-01-04 DIAGNOSIS — Z0000 Encounter for general adult medical examination without abnormal findings: Secondary | ICD-10-CM | POA: Diagnosis not present

## 2022-01-04 NOTE — Patient Instructions (Signed)
Carol Wilson , Thank you for taking time to complete  your Medicare Wellness Visit. I appreciate your ongoing commitment to your health goals. Please review the following plan we discussed and let me know if I can assist you in the future.   Screening recommendations/referrals: Colonoscopy: Due-Per our conversation, you will call GI to schedule. Mammogram: Completed 11/17/2021-Due 11/17/2022 Bone Density: Completed 10/07/2020-Due 10/07/2022 Recommended yearly ophthalmology/optometry visit for glaucoma screening and checkup Recommended yearly dental visit for hygiene and checkup  Vaccinations: Influenza vaccine: Declined Pneumococcal vaccine: Up to date Tdap vaccine: Up to date Shingles vaccine: May obtain vaccine at your local pharmacy. Covid-19:Declined  Advanced directives: Copy in chart  Conditions/risks identified: See problem list  Next appointment: Follow up in one year for your annual wellness visit 01/10/2023 @ 10:20   Preventive Care 65 Years and Older, Female Preventive care refers to lifestyle choices and visits with your health care provider that can promote health and wellness. What does preventive care include? A yearly physical exam. This is also called an annual well check. Dental exams once or twice a year. Routine eye exams. Ask your health care provider how often you should have your eyes checked. Personal lifestyle choices, including: Daily care of your teeth and gums. Regular physical activity. Eating a healthy diet. Avoiding tobacco and drug use. Limiting alcohol use. Practicing safe sex. Taking low-dose aspirin every day. Taking vitamin and mineral supplements as recommended by your health care provider. What happens during an annual well check? The services and screenings done by your health care provider during your annual well check will depend on your age, overall health, lifestyle risk factors, and family history of disease. Counseling  Your health  care provider may ask you questions about your: Alcohol use. Tobacco use. Drug use. Emotional well-being. Home and relationship well-being. Sexual activity. Eating habits. History of falls. Memory and ability to understand (cognition). Work and work Statistician. Reproductive health. Screening  You may have the following tests or measurements: Height, weight, and BMI. Blood pressure. Lipid and cholesterol levels. These may be checked every 5 years, or more frequently if you are over 13 years old. Skin check. Lung cancer screening. You may have this screening every year starting at age 38 if you have a 30-pack-year history of smoking and currently smoke or have quit within the past 15 years. Fecal occult blood test (FOBT) of the stool. You may have this test every year starting at age 29. Flexible sigmoidoscopy or colonoscopy. You may have a sigmoidoscopy every 5 years or a colonoscopy every 10 years starting at age 20. Hepatitis C blood test. Hepatitis B blood test. Sexually transmitted disease (STD) testing. Diabetes screening. This is done by checking your blood sugar (glucose) after you have not eaten for a while (fasting). You may have this done every 1-3 years. Bone density scan. This is done to screen for osteoporosis. You may have this done starting at age 45. Mammogram. This may be done every 1-2 years. Talk to your health care provider about how often you should have regular mammograms. Talk with your health care provider about your test results, treatment options, and if necessary, the need for more tests. Vaccines  Your health care provider may recommend certain vaccines, such as: Influenza vaccine. This is recommended every year. Tetanus, diphtheria, and acellular pertussis (Tdap, Td) vaccine. You may need a Td booster every 10 years. Zoster vaccine. You may need this after age 72. Pneumococcal 13-valent conjugate (PCV13) vaccine. One dose is recommended  after age  36. Pneumococcal polysaccharide (PPSV23) vaccine. One dose is recommended after age 28. Talk to your health care provider about which screenings and vaccines you need and how often you need them. This information is not intended to replace advice given to you by your health care provider. Make sure you discuss any questions you have with your health care provider. Document Released: 12/05/2015 Document Revised: 07/28/2016 Document Reviewed: 09/09/2015 Elsevier Interactive Patient Education  2017 [place] Prevention in the Home Falls can cause injuries. They can happen to people of all ages. There are many things you can do to make your home safe and to help prevent falls. What can I do on the outside of my home? Regularly fix the edges of walkways and driveways and fix any cracks. Remove anything that might make you trip as you walk through a door, such as a raised step or threshold. Trim any bushes or trees on the path to your home. Use bright outdoor lighting. Clear any walking paths of anything that might make someone trip, such as rocks or tools. Regularly check to see if handrails are loose or broken. Make sure that both sides of any steps have handrails. Any raised decks and porches should have guardrails on the edges. Have any leaves, snow, or ice cleared regularly. Use sand or salt on walking paths during winter. Clean up any spills in your garage right away. This includes oil or grease spills. What can I do in the bathroom? Use night lights. Install grab bars by the toilet and in the tub and shower. Do not use towel bars as grab bars. Use non-skid mats or decals in the tub or shower. If you need to sit down in the shower, use a plastic, non-slip stool. Keep the floor dry. Clean up any water that spills on the floor as soon as it happens. Remove soap buildup in the tub or shower regularly. Attach bath mats securely with double-sided non-slip rug tape. Do not have throw  rugs and other things on the floor that can make you trip. What can I do in the bedroom? Use night lights. Make sure that you have a light by your bed that is easy to reach. Do not use any sheets or blankets that are too big for your bed. They should not hang down onto the floor. Have a firm chair that has side arms. You can use this for support while you get dressed. Do not have throw rugs and other things on the floor that can make you trip. What can I do in the kitchen? Clean up any spills right away. Avoid walking on wet floors. Keep items that you use a lot in easy-to-reach places. If you need to reach something above you, use a strong step stool that has a grab bar. Keep electrical cords out of the way. Do not use floor polish or wax that makes floors slippery. If you must use wax, use non-skid floor wax. Do not have throw rugs and other things on the floor that can make you trip. What can I do with my stairs? Do not leave any items on the stairs. Make sure that there are handrails on both sides of the stairs and use them. Fix handrails that are broken or loose. Make sure that handrails are as long as the stairways. Check any carpeting to make sure that it is firmly attached to the stairs. Fix any carpet that is loose or worn. Avoid having throw  rugs at the top or bottom of the stairs. If you do have throw rugs, attach them to the floor with carpet tape. Make sure that you have a light switch at the top of the stairs and the bottom of the stairs. If you do not have them, ask someone to add them for you. What else can I do to help prevent falls? Wear shoes that: Do not have high heels. Have rubber bottoms. Are comfortable and fit you well. Are closed at the toe. Do not wear sandals. If you use a stepladder: Make sure that it is fully opened. Do not climb a closed stepladder. Make sure that both sides of the stepladder are locked into place. Ask someone to hold it for you, if  possible. Clearly mark and make sure that you can see: Any grab bars or handrails. First and last steps. Where the edge of each step is. Use tools that help you move around (mobility aids) if they are needed. These include: Canes. Walkers. Scooters. Crutches. Turn on the lights when you go into a dark area. Replace any light bulbs as soon as they burn out. Set up your furniture so you have a clear path. Avoid moving your furniture around. If any of your floors are uneven, fix them. If there are any pets around you, be aware of where they are. Review your medicines with your doctor. Some medicines can make you feel dizzy. This can increase your chance of falling. Ask your doctor what other things that you can do to help prevent falls. This information is not intended to replace advice given to you by your health care provider. Make sure you discuss any questions you have with your health care provider. Document Released: 09/04/2009 Document Revised: 04/15/2016 Document Reviewed: 12/13/2014 Elsevier Interactive Patient Education  2017 [employer].

## 2022-01-04 NOTE — Progress Notes (Signed)
Subjective:   Carol Wilson is a 76 y.o. female who presents for Medicare Annual (Subsequent) preventive examination.  I connected with Zenna today by telephone and verified that I am speaking with the correct person using two identifiers. Location patient: home Location provider: work Persons participating in the virtual visit: patient, [name].    I discussed the limitations, risks, security and privacy concerns of performing an evaluation and management service by telephone and the availability of in person appointments. I also discussed with the patient that there may be a patient responsible charge related to this service. The patient expressed understanding and verbally consented to this telephonic visit.    Interactive audio and video telecommunications were attempted between this provider and patient, however failed, due to patient having technical difficulties OR patient did not have access to video capability.  We continued and completed visit with audio only.  Some vital signs may be absent or patient reported.   Time Spent with patient on telephone encounter: 20 minutes   Review of Systems     Cardiac Risk Factors include: advanced age (>70men, >35 women);dyslipidemia;hypertension     Objective:    Today's Vitals   01/04/22 1421 01/04/22 1422  Weight: 160 lb (72.6 kg)   Height: 5\' 6"  (1.676 m)   PainSc:  7    Body mass index is 25.82 kg/m.  Advanced Directives 01/04/2022 01/17/2020 01/15/2019 02/17/2018 01/12/2018 01/10/2017  Does Patient Have a Medical Advance Directive? Yes Yes Yes No Yes Yes  Type of Paramedic of [place];Living will [place];Living will [place];Living will - [place];Living will [place];Living will  Does patient want to make changes to medical advance directive? - No - Patient declined No - Patient declined - - -  Copy of [place] in Chart? Yes - validated most recent copy scanned in chart (See row information) Yes - validated most recent copy scanned in chart (See row information) Yes - validated most recent copy scanned in chart (See row information) - No - copy requested No - copy requested  Would patient like information on creating a medical advance directive? - - - No - Patient declined - -    Current Medications (verified) Outpatient Encounter Medications as of 01/04/2022  Medication Sig   alendronate (FOSAMAX) 70 MG tablet Take 1 tablet (70 mg total) by mouth every 7 (seven) days. Take with a full glass of water on an empty stomach. Remain upright for 30-45 mins.   ALPRAZolam (XANAX) 0.5 MG tablet Take 0.5-1 tablets (0.25-0.5 mg total) by mouth at bedtime as needed for sleep.   ezetimibe-simvastatin (VYTORIN) 10-40 MG tablet Take 1 tablet by mouth daily.   fluticasone (FLONASE) 50 MCG/ACT nasal spray Place 2 sprays into both nostrils daily.   loperamide (IMODIUM) 2 MG capsule Take 2 mg by mouth daily.   metroNIDAZOLE (METROGEL) 1 % gel Apply 1 application topically daily.   primidone (MYSOLINE) 50 MG tablet TAKE 1/2 TABLET AT BEDTIME   propranolol (INDERAL) 10 MG tablet Take 10 mg by mouth daily.   sertraline (ZOLOFT) 100 MG tablet Take 100 mg by mouth daily.   No facility-administered encounter medications on file as of 01/04/2022.    Allergies (verified) Sulfa antibiotics   History: Past Medical History:  Diagnosis Date   Anxiety    Colon polyps    Depression    started ~ 2008 after she lost a daughter  Familial tremor    HTN (hypertension)    well controlled with propranolol   Hyperlipidemia    Osteopenia    Rosacea    sees derm   Past Surgical History:  Procedure Laterality Date   APPENDECTOMY     lenses implant B 2018 Bilateral 2018   TUBAL LIGATION     Family History  Problem Relation Age of Onset   Lung cancer Daughter    Coronary artery disease Mother        M 58y/o and F  68y/o   Heart attack Father    Alcohol abuse Brother    Tuberculosis Daughter    Diabetes Neg Hx    Hypertension Neg Hx    Stroke Neg Hx    Colon cancer Neg Hx    Breast cancer Neg Hx    Social History   Socioeconomic History   Marital status: Married    Spouse name: Not on file   Number of children: 2   Years of education: Not on file   Highest education level: Not on file  Occupational History   Occupation: retired-- cooks for the school    Employer: [employer]  Tobacco Use   Smoking status: Never   Smokeless tobacco: Never  Substance and Sexual Activity   Alcohol use: Yes    Comment: once a month   Drug use: No   Sexual activity: Not Currently  Other Topics Concern   Not on file  Social History Narrative   Very active   Household: pt, husband, g-son lives there part time      lost her only daughter to lung cancer ~ 2008, lost a 90 old month baby years ago , no living children, takes care of  his [place], 76 y/o   Social Determinants of Radio broadcast assistant Strain: Low Risk    Difficulty of Paying Living Expenses: Not hard at all  Food Insecurity: No Food Insecurity   Worried About Charity fundraiser in the Last Year: Never true   Arboriculturist in the Last Year: Never true  Transportation Needs: No Transportation Needs   Lack of Transportation (Medical): No   Lack of Transportation (Non-Medical): No  Physical Activity: Insufficiently Active   Days of Exercise per Week: 2 days   Minutes of Exercise per Session: 60 min  Stress: No Stress Concern Present   Feeling of Stress : Not at all  Social Connections: Socially Integrated   Frequency of Communication with Friends and Family: More than three times a week   Frequency of Social Gatherings with Friends and Family: Three times a week   Attends Religious Services: More than 4 times per year   Active Member of Clubs or Organizations: Yes   Attends Music therapist: More than 4 times per  year   Marital Status: Married    Tobacco Counseling Counseling given: Not Answered   Clinical Intake:  Pre-visit preparation completed: Yes  Pain : 0-10 Pain Score: 7  Pain Type: Chronic pain Pain Location: Head Pain Onset: More than a month ago Pain Frequency: Intermittent     BMI - recorded: 25.82 Nutritional Status: BMI 25 -29 Overweight Nutritional Risks: None Diabetes: No  How often do you need to have someone help you when you read instructions, pamphlets, or other written materials from your doctor or pharmacy?: 1 - Never  Diabetic?No  Interpreter Needed?: No  Information entered by :: Caroleen Hamman LPN   Activities of  Daily Living In your present state of health, do you have any difficulty performing the following activities: 01/04/2022 02/19/2021  Hearing? N N  Vision? N N  Difficulty concentrating or making decisions? N N  Walking or climbing stairs? Y N  Comment stairs -  Dressing or bathing? N N  Doing errands, shopping? N N  Preparing Food and eating ? N -  Using the Toilet? N -  In the past six months, have you accidently leaked urine? N -  Do you have problems with loss of bowel control? N -  Managing your Medications? N -  Managing your Finances? N -  Housekeeping or managing your Housekeeping? N -  Some recent data might be hidden    Patient Care Team: Colon Branch, MD as PCP - General (Internal Medicine) Tat, Eustace Quail, DO as Consulting Physician (Neurology)  Indicate any recent Medical Services you may have received from other than [facility] providers in the past year (date may be approximate).     Assessment:   This is a routine wellness examination for Deania.  Hearing/Vision screen Hearing Screening - Comments:: No issues Vision Screening - Comments:: Last eye exam-2 years ago-Has an appt in March with Dr. Katy Fitch  Dietary issues and exercise activities discussed: Current Exercise Habits: Structured exercise class, Type of exercise:  walking (aerobics), Time (Minutes): 60, Frequency (Times/Week): 2, Weekly Exercise (Minutes/Week): 120, Intensity: Mild   Goals Addressed             This Visit's Progress    DIET - REDUCE SUGAR INTAKE   Not on track    Maintain healthy active lifestyle.   On track      Depression Screen PHQ 2/9 Scores 01/04/2022 06/16/2021 02/19/2021 01/17/2020 01/15/2019 01/12/2018 01/10/2017  PHQ - 2 Score 0 0 0 0 0 0 0  PHQ- 9 Score - 3 4 - - - -    Fall Risk Fall Risk  01/04/2022 06/16/2021 02/19/2021 01/17/2020 01/15/2019  Falls in the past year? 0 0 0 0 0  Number falls in past yr: 0 0 0 0 -  Injury with Fall? 0 0 0 0 -  Follow up Falls prevention discussed Falls evaluation completed Falls evaluation completed Education provided;Falls prevention discussed -    FALL RISK PREVENTION PERTAINING TO THE HOME:  Any stairs in or around the home? Yes  If so, are there any without handrails? No  Home free of loose throw rugs in walkways, pet beds, electrical cords, etc? Yes  Adequate lighting in your home to reduce risk of falls? Yes   ASSISTIVE DEVICES UTILIZED TO PREVENT FALLS:  Life alert? No  Use of a cane, walker or w/c? No  Grab bars in the bathroom? No  Shower chair or bench in shower? No  Elevated toilet seat or a handicapped toilet? No   TIMED UP AND GO:  Was the test performed? No . Phone visit   Cognitive Function:Normal cognitive status assessed by this Nurse Health Advisor. No abnormalities found.   MMSE - Mini Mental State Exam 01/12/2018 01/10/2017  Orientation to time 5 5  Orientation to Place 5 5  Registration 3 3  Attention/ Calculation 5 5  Recall 3 1  Language- name 2 objects 2 2  Language- repeat 1 1  Language- follow 3 step command 3 3  Language- read & follow direction 1 1  Write a sentence 1 1  Copy design 1 1  Total score 30 28  Immunizations Immunization History  Administered Date(s) Administered   Fluad Quad(high Dose 65+) 08/28/2020   H1N1  11/07/2008   Influenza Whole 08/22/2009   Influenza, High Dose Seasonal PF 08/16/2013, 08/13/2016, 08/18/2017, 08/16/2018, 08/22/2019   Influenza,inj,Quad PF,6+ Mos 08/06/2014, 08/13/2015   Pneumococcal Conjugate-13 08/06/2014   Pneumococcal Polysaccharide-23 06/08/2011   Td 02/22/2007, 08/18/2017   Zoster, Live 12/15/2009    TDAP status: Up to date  Flu Vaccine status: Declined, Education has been provided regarding the importance of this vaccine but patient still declined. Advised may receive this vaccine at local pharmacy or Health Dept. Aware to provide a copy of the vaccination record if obtained from local pharmacy or Health Dept. Verbalized acceptance and understanding.  Pneumococcal vaccine status: Up to date  Covid-19 vaccine status: Declined, Education has been provided regarding the importance of this vaccine but patient still declined. Advised may receive this vaccine at local pharmacy or Health Dept.or vaccine clinic. Aware to provide a copy of the vaccination record if obtained from local pharmacy or Health Dept. Verbalized acceptance and understanding.  Qualifies for Shingles Vaccine? Yes   Zostavax completed Yes   Shingrix Completed?: No.    Education has been provided regarding the importance of this vaccine. Patient has been advised to call insurance company to determine out of pocket expense if they have not yet received this vaccine. Advised may also receive vaccine at local pharmacy or Health Dept. Verbalized acceptance and understanding.  Screening Tests Health Maintenance  Topic Date Due   Zoster Vaccines- Shingrix (1 of 2) Never done   COLONOSCOPY (Pts 45-62yrs Insurance coverage will need to be confirmed)  02/17/2021   COVID-19 Vaccine (1) 02/03/2022 (Originally 09/28/1946)   INFLUENZA VACCINE  02/19/2022 (Originally 06/22/2021)   MAMMOGRAM  11/17/2022   TETANUS/TDAP  08/19/2027   Pneumonia Vaccine 103+ Years old  Completed   DEXA SCAN  Completed   Hepatitis C  Screening  Completed   HPV VACCINES  Aged Out    Health Maintenance  Health Maintenance Due  Topic Date Due   Zoster Vaccines- Shingrix (1 of 2) Never done   COLONOSCOPY (Pts 45-16yrs Insurance coverage will need to be confirmed)  02/17/2021    Colorectal cancer screening: Type of screening: Colonoscopy. Completed 02/17/2018. Repeat every 3 years  Mammogram status: Completed bilateral 11/17/2021. Repeat every year  Bone Density status: Completed 10/07/2020. Results reflect: Bone density results: OSTEOPENIA. Repeat every 2 years.  Lung Cancer Screening: (Low Dose CT Chest recommended if Age 60-80 years, 30 pack-year currently smoking OR have quit w/in 15years.) does not qualify.     Additional Screening:  Hepatitis C Screening: Completed 08/13/2015  Vision Screening: Recommended annual ophthalmology exams for early detection of glaucoma and other disorders of the eye. Is the patient up to date with their annual eye exam?  Yes  Who is the provider or what is the name of the office in which the patient attends annual eye exams? Dr. Katy Fitch   Dental Screening: Recommended annual dental exams for proper oral hygiene  Community Resource Referral / Chronic Care Management: CRR required this visit?  No   CCM required this visit?  No      Plan:     I have personally reviewed and noted the following in the patients chart:   Medical and social history Use of alcohol, tobacco or illicit drugs  Current medications and supplements including opioid prescriptions.  Functional ability and status Nutritional status Physical activity Advanced directives List of other physicians Hospitalizations, surgeries,  and ER visits in previous 12 months Vitals Screenings to include cognitive, depression, and falls Referrals and appointments  In addition, I have reviewed and discussed with patient certain preventive protocols, quality metrics, and best practice recommendations. A written  personalized care plan for preventive services as well as general preventive health recommendations were provided to patient.   Due to this being a telephonic visit, the after visit summary with patients personalized plan was offered to patient via mail or my-chart.  Patient would like to access on my-chart.   Marta Antu, LPN   [id number]  Nurse Health Advisor  Nurse Notes: None

## 2022-01-08 ENCOUNTER — Other Ambulatory Visit: Payer: Self-pay | Admitting: Internal Medicine

## 2022-01-09 LAB — SARS CORONAVIRUS 2 (TAT 6-24 HRS): SARS Coronavirus 2: NEGATIVE

## 2022-01-11 ENCOUNTER — Ambulatory Visit (INDEPENDENT_AMBULATORY_CARE_PROVIDER_SITE_OTHER): Payer: Medicare HMO | Admitting: Internal Medicine

## 2022-01-11 ENCOUNTER — Ambulatory Visit: Payer: Medicare HMO | Admitting: Internal Medicine

## 2022-01-11 ENCOUNTER — Other Ambulatory Visit: Payer: Self-pay

## 2022-01-11 ENCOUNTER — Encounter: Payer: Self-pay | Admitting: Internal Medicine

## 2022-01-11 VITALS — BP 122/60 | HR 75 | Temp 97.8°F | Ht 66.0 in | Wt 157.0 lb

## 2022-01-11 DIAGNOSIS — R5383 Other fatigue: Secondary | ICD-10-CM

## 2022-01-11 DIAGNOSIS — R0602 Shortness of breath: Secondary | ICD-10-CM | POA: Diagnosis not present

## 2022-01-11 DIAGNOSIS — G250 Essential tremor: Secondary | ICD-10-CM

## 2022-01-11 NOTE — Progress Notes (Signed)
Carol Wilson    [id number]    [birth year]  Primary Care Physician:Paz, Alda Berthold, MD Date of Appointment: 01/11/2022 Established Patient Visit  Chief complaint:   Chief Complaint  Patient presents with   Follow-up    F/u after PFT     HPI: Carol Wilson is a .age woman with history of essential tremor who presented with shortness of breath  Interval Updates: Here for follow up after PFTs. Normal pulmonary function.  Has been on inderal for over 40 years. Fatigue did improve when she stopped it.  Primidone, also stopped this and her fatigue improved. She did notice a worsening of her tremor when she did this.   No wheezing coughing or chest tightness.    I have reviewed the patient's family social and past medical history and updated as appropriate.   Past Medical History:  Diagnosis Date   Anxiety    Colon polyps    Depression    started ~ 2008 after she lost a daughter   Familial tremor    HTN (hypertension)    well controlled with propranolol   Hyperlipidemia    Osteopenia    Rosacea    sees derm    Past Surgical History:  Procedure Laterality Date   APPENDECTOMY     lenses implant B 2018 Bilateral 2018   TUBAL LIGATION      Family History  Problem Relation Age of Onset   Lung cancer Daughter    Coronary artery disease Mother        M 58y/o and F 68y/o   Heart attack Father    Alcohol abuse Brother    Tuberculosis Daughter    Diabetes Neg Hx    Hypertension Neg Hx    Stroke Neg Hx    Colon cancer Neg Hx    Breast cancer Neg Hx     Social History   Occupational History   Occupation: retired-- cooks for the school    Employer: [employer]  Tobacco Use   Smoking status: Never   Smokeless tobacco: Never  Substance and Sexual Activity   Alcohol use: Yes    Comment: once a month   Drug use: No   Sexual activity: Not Currently     Physical Exam: Blood pressure 122/60, pulse 75, temperature 97.8 F (36.6 C), temperature  source Oral, height 5\' 6"  (1.676 m), weight 157 lb (71.2 kg), SpO2 95 %.  Gen:      No acute distress, essential tremor Lungs:    No increased respiratory effort, symmetric chest wall excursion, clear to auscultation bilaterally, no wheezes or crackles CV:         Regular rate and rhythm; no murmurs, rubs, or gallops.  No pedal edema   Data Reviewed: Imaging: I have personally reviewed the chest xray July 2022 = no acute process  PFTs:  PFT Results Latest Ref Rng & Units 01/11/2022  FVC-Pre L 2.52  FVC-Predicted Pre % 82  FVC-Post L 2.49  FVC-Predicted Post % 81  Pre FEV1/FVC % % 75  Post FEV1/FCV % % 80  FEV1-Pre L 1.88  FEV1-Predicted Pre % 81  FEV1-Post L 1.99  DLCO uncorrected ml/min/mmHg 16.58  DLCO UNC% % 81  DLCO corrected ml/min/mmHg 16.58  DLCO COR %Predicted % 81  DLVA Predicted % 88  TLC L 5.36  TLC % Predicted % 100  RV % Predicted % 115   I have personally reviewed the patient's PFTs  and normal pulmonary function.   Labs: Lab Results  Component Value Date   WBC 4.4 06/16/2021   HGB 11.8 (L) 06/16/2021   HCT 36.8 06/16/2021   MCV 80.9 06/16/2021   PLT 290.0 06/16/2021   Lab Results  Component Value Date   NA 140 02/19/2021   K 4.5 02/19/2021   CL 104 02/19/2021   CO2 32 02/19/2021     Immunization status: Immunization History  Administered Date(s) Administered   Fluad Quad(high Dose 65+) 08/28/2020   H1N1 11/07/2008   Influenza Whole 08/22/2009   Influenza, High Dose Seasonal PF 08/16/2013, 08/13/2016, 08/18/2017, 08/16/2018, 08/22/2019   Influenza,inj,Quad PF,6+ Mos 08/06/2014, 08/13/2015   Pneumococcal Conjugate-13 08/06/2014   Pneumococcal Polysaccharide-23 06/08/2011   Td 02/22/2007, 08/18/2017   Zoster, Live 12/15/2009    External Records Personally Reviewed: pcp, cardiology  Assessment:  Shortness of breath Essential Tremor  Plan/Recommendations: Pulmonary function testing is normal. Exam and imaging is reassuring.  She is  reassured by this as well.  On further discussion it seems her shortness of breath really may be more fatigue. She is able to exercise regularly at a brisk pace and has no functional limitations from these symptoms. She denies sleep disturbances or apneas. I suspect the fact that she gets better with stopping beta blocker means we are dealing with medication side effect. Encouraged her to speak with her neurologist regarding optimizing treatment for her tremors. May need to titrate down to an efficacious dose without side effects.     Return to Care: Return if symptoms worsen or fail to improve.   Lenice Llamas, MD Pulmonary and [place]

## 2022-01-11 NOTE — Progress Notes (Signed)
Full PFT performed today. °

## 2022-01-11 NOTE — Patient Instructions (Addendum)
I am happy to see you back as needed.  Hopefully coming down on your tremor medications, especially the inderal and the primadone, will help with your symptoms of fatigue.

## 2022-01-11 NOTE — Progress Notes (Deleted)
Full PFT performed today. °

## 2022-01-11 NOTE — Patient Instructions (Signed)
Full PFT performed today. °

## 2022-01-12 LAB — PULMONARY FUNCTION TEST
DL/VA % pred: 88 %
DL/VA: 3.57 ml/min/mmHg/L
DLCO cor % pred: 81 %
DLCO cor: 16.58 ml/min/mmHg
DLCO unc % pred: 81 %
DLCO unc: 16.58 ml/min/mmHg
FEF 25-75 Post: 1.85 L/sec
FEF 25-75 Pre: 1.76 L/sec
FEF2575-%Change-Post: 5 %
FEF2575-%Pred-Post: 104 %
FEF2575-%Pred-Pre: 99 %
FEV1-%Change-Post: 5 %
FEV1-%Pred-Post: 86 %
FEV1-%Pred-Pre: 81 %
FEV1-Post: 1.99 L
FEV1-Pre: 1.88 L
FEV1FVC-%Change-Post: 6 %
FEV1FVC-%Pred-Pre: 99 %
FEV6-%Change-Post: 1 %
FEV6-%Pred-Post: 85 %
FEV6-%Pred-Pre: 84 %
FEV6-Post: 2.49 L
FEV6-Pre: 2.46 L
FEV6FVC-%Pred-Post: 104 %
FEV6FVC-%Pred-Pre: 104 %
FVC-%Change-Post: -1 %
FVC-%Pred-Post: 81 %
FVC-%Pred-Pre: 82 %
FVC-Post: 2.49 L
FVC-Pre: 2.52 L
Post FEV1/FVC ratio: 80 %
Post FEV6/FVC ratio: 100 %
Pre FEV1/FVC ratio: 75 %
Pre FEV6/FVC Ratio: 100 %
RV % pred: 115 %
RV: 2.77 L
TLC % pred: 100 %
TLC: 5.36 L

## 2022-01-19 ENCOUNTER — Telehealth: Payer: Self-pay

## 2022-01-19 NOTE — Telephone Encounter (Signed)
Called Glenice back advising Dr. Larose Kells is willing to accept her sister, Kaysa as a new pt and left a detailed message on her VM asking her to please call me back and I will go over with her what we need to get (records) and what Chong Sicilian will need to sign so we can get them.

## 2022-01-19 NOTE — Telephone Encounter (Signed)
Please advise 

## 2022-01-19 NOTE — Telephone Encounter (Signed)
Okay to schedule NP appt - please do so several weeks out. Please ask Pt to come by and sign ROI from previous PCP- we would like to have records at time of NP appt.

## 2022-01-19 NOTE — Telephone Encounter (Signed)
Pt called asking if Dr. Larose Kells would consider taking on her twin sister as a new patient.  She stated she has never been seen in the [facility] system before and she accompanies her to all of her appointments.  I advised I would ask and let her know.  Sister's name is Surveyor, quantity.

## 2022-01-19 NOTE — Telephone Encounter (Signed)
Yes, that is okay.  Please bring records from previous doctors

## 2022-02-05 ENCOUNTER — Encounter: Payer: Self-pay | Admitting: Internal Medicine

## 2022-02-09 DIAGNOSIS — H5213 Myopia, bilateral: Secondary | ICD-10-CM | POA: Diagnosis not present

## 2022-02-12 ENCOUNTER — Ambulatory Visit: Payer: Medicare HMO | Admitting: Cardiology

## 2022-02-12 ENCOUNTER — Other Ambulatory Visit: Payer: Self-pay

## 2022-02-12 ENCOUNTER — Encounter: Payer: Self-pay | Admitting: Cardiology

## 2022-02-12 VITALS — BP 132/70 | HR 65 | Ht 66.0 in | Wt 158.0 lb

## 2022-02-12 DIAGNOSIS — E782 Mixed hyperlipidemia: Secondary | ICD-10-CM | POA: Diagnosis not present

## 2022-02-12 DIAGNOSIS — F419 Anxiety disorder, unspecified: Secondary | ICD-10-CM

## 2022-02-12 DIAGNOSIS — R0609 Other forms of dyspnea: Secondary | ICD-10-CM

## 2022-02-12 DIAGNOSIS — G250 Essential tremor: Secondary | ICD-10-CM

## 2022-02-12 NOTE — Progress Notes (Signed)
?Cardiology Office Note:   ? ?Date:  02/12/2022  ? ?ID:  Carol Wilson, DOB 02-27-1946, MRN [id number] ? ?PCP:  Colon Branch, MD  ?Cardiologist:  Jenne Campus, MD   ? ?Referring MD: Colon Branch, MD  ? ?Chief Complaint  ?Patient presents with  ? Follow-up  ?I am feeling much better ? ?History of Present Illness:   ? ?Carol Wilson is a 76 y.o. female with past medical history significant for essential hypertension, anxiety, depression, dyslipidemia, tremor.  She was referred to [place] because of dyspnea on exertion, quite extensive evaluation has been performed both from pulmonary standpoint review as well as cardiology gall[place] point review.  She did have echocardiogram done which showed preserved left ventricle ejection fraction, she did have grade 1 diastolic dysfunction, pulmonary pressure was normal.  She did have a stress test which showed no evidence of ischemia after that she was referred to pulmonary who did pulmonary function test and everything was normal.  Eventually feeling was that maybe his symptomatology is related to some deconditioning, also usage of beta-blocker to control her tremor.  She is stop beta-blocker use it only for weekend when she goes to [place] because seems to be helping quite significantly she is much more optimistic and happy today she says she is feeling well. ? ?Past Medical History:  ?Diagnosis Date  ? Anxiety   ? Colon polyps   ? Depression   ? started ~ 2008 after she lost a daughter  ? Familial tremor   ? HTN (hypertension)   ? well controlled with propranolol  ? Hyperlipidemia   ? Osteopenia   ? Rosacea   ? sees derm  ? ? ?Past Surgical History:  ?Procedure Laterality Date  ? APPENDECTOMY    ? lenses implant B 2018 Bilateral 2018  ? TUBAL LIGATION    ? ? ?Current Medications: ?Current Meds  ?Medication Sig  ? alendronate (FOSAMAX) 70 MG tablet Take 1 tablet (70 mg total) by mouth every 7 (seven) days. Take with a full glass of water on an empty stomach. Remain upright for  30-45 mins.  ? ALPRAZolam (XANAX) 0.5 MG tablet Take 0.5-1 tablets (0.25-0.5 mg total) by mouth at bedtime as needed for sleep.  ? ezetimibe-simvastatin (VYTORIN) 10-40 MG tablet Take 1 tablet by mouth daily.  ? fluticasone (FLONASE) 50 MCG/ACT nasal spray Place 2 sprays into both nostrils daily.  ? loperamide (IMODIUM) 2 MG capsule Take 2 mg by mouth daily.  ? metroNIDAZOLE (METROGEL) 1 % gel Apply 1 application topically daily.  ? primidone (MYSOLINE) 50 MG tablet TAKE 1/2 TABLET AT BEDTIME (Patient taking differently: Take 25 mg by mouth at bedtime.)  ? propranolol (INDERAL) 10 MG tablet Take 10 mg by mouth daily.  ? sertraline (ZOLOFT) 100 MG tablet Take 100 mg by mouth daily.  ?  ? ?Allergies:   Sulfa antibiotics  ? ?Social History  ? ?Socioeconomic History  ? Marital status: Married  ?  Spouse name: Not on file  ? Number of children: 2  ? Years of education: Not on file  ? Highest education level: Not on file  ?Occupational History  ? Occupation: retired-- cooks for the school  ?  Employer: [employer]  ?Tobacco Use  ? Smoking status: Never  ? Smokeless tobacco: Never  ?Substance and Sexual Activity  ? Alcohol use: Yes  ?  Comment: once a month  ? Drug use: No  ? Sexual activity: Not Currently  ?Other Topics Concern  ?  Not on file  ?Social History Narrative  ? Very active  ? Household: pt, husband, g-son lives there part time  ?   ? lost her only daughter to lung cancer ~ 2008, lost a 43 old month baby years ago , no living children, takes care of  his [name], 76 y/o  ? ?Social Determinants of Health  ? ?Financial Resource Strain: Low Risk   ? Difficulty of Paying Living Expenses: Not hard at all  ?Food Insecurity: No Food Insecurity  ? Worried About Charity fundraiser in the Last Year: Never true  ? Ran Out of Food in the Last Year: Never true  ?Transportation Needs: No Transportation Needs  ? Lack of Transportation (Medical): No  ? Lack of Transportation (Non-Medical): No  ?Physical Activity:  Insufficiently Active  ? Days of Exercise per Week: 2 days  ? Minutes of Exercise per Session: 60 min  ?Stress: No Stress Concern Present  ? Feeling of Stress : Not at all  ?Social Connections: Socially Integrated  ? Frequency of Communication with Friends and Family: More than three times a week  ? Frequency of Social Gatherings with Friends and Family: Three times a week  ? Attends Religious Services: More than 4 times per year  ? Active Member of Clubs or Organizations: Yes  ? Attends Archivist Meetings: More than 4 times per year  ? Marital Status: Married  ?  ? ?Family History: ?The patient's family history includes Alcohol abuse in her brother; Coronary artery disease in her mother; Heart attack in her father; Lung cancer in her daughter; Tuberculosis in her daughter. There is no history of Diabetes, Hypertension, Stroke, Colon cancer, or Breast cancer. ?ROS:   ?Please see the history of present illness.    ?All 14 point review of systems negative except as described per history of present illness ? ?EKGs/Labs/Other Studies Reviewed:   ? ? ? ?Recent Labs: ?02/19/2021: ALT 13; BUN 18; Creatinine, Ser 0.87; Potassium 4.5; Sodium 140 ?06/16/2021: Hemoglobin 11.8; Platelets 290.0; TSH 3.21  ?Recent Lipid Panel ?   ?Component Value Date/Time  ? CHOL 181 02/19/2021 0858  ? TRIG 91.0 02/19/2021 0858  ? HDL 65.30 02/19/2021 0858  ? CHOLHDL 3 02/19/2021 0858  ? VLDL 18.2 02/19/2021 0858  ? [place] 97 02/19/2021 0858  ? LDLDIRECT 124.3 05/09/2013 0817  ? ? ?Physical Exam:   ? ?VS:  BP 132/70 (BP Location: Right Arm, Patient Position: Sitting)   Pulse 65   Ht '5\' 6"'$  (1.676 m)   Wt 158 lb (71.7 kg)   SpO2 95%   BMI 25.50 kg/m?    ? ?Wt Readings from Last 3 Encounters:  ?02/12/22 158 lb (71.7 kg)  ?01/11/22 157 lb (71.2 kg)  ?01/04/22 160 lb (72.6 kg)  ?  ? ?GEN:  Well nourished, well developed in no acute distress ?HEENT: Normal ?NECK: No JVD; No carotid bruits ?LYMPHATICS: No lymphadenopathy ?CARDIAC: RRR,  no murmurs, no rubs, no gallops ?RESPIRATORY:  Clear to auscultation without rales, wheezing or rhonchi  ?ABDOMEN: Soft, non-tender, non-distended ?MUSCULOSKELETAL:  No edema; No deformity  ?SKIN: Warm and dry ?LOWER EXTREMITIES: no swelling ?NEUROLOGIC:  Alert and oriented x 3 ?PSYCHIATRIC:  Normal affect  ? ?ASSESSMENT:   ? ?1. Dyspnea on exertion   ?2. Anxiety   ?3. Essential tremor   ?4. Mixed hyperlipidemia   ? ?PLAN:   ? ?In order of problems listed above: ? ?Dyspnea on exertion.  Cardiac and pulmonary work-up negative.  I  encouraged her to be all be more active and exercise on the regular basis and build up stamina.  She is already doing it.  And she is already feeling better. ?Anxiety after all test done and all test came negative she feels much better and doing well. ?Essential tremor still a problem however she takes medication on as-needed basis typically for weekend.  [place]. ?Dyslipidemia cholesterol done by primary care physician reviewed.  Her HDL was 65 LDL 97.  That is acceptable cholesterol profile.  We will continue present management ? ? ?Medication Adjustments/Labs and Tests Ordered: ?Current medicines are reviewed at length with the patient today.  Concerns regarding medicines are outlined above.  ?No orders of the defined types were placed in this encounter. ? ?Medication changes: No orders of the defined types were placed in this encounter. ? ? ?Signed, ?Park Liter, MD, [facility] ?02/12/2022 8:35 AM    ?[place] ?

## 2022-02-12 NOTE — Patient Instructions (Signed)

## 2022-03-29 ENCOUNTER — Encounter: Payer: Self-pay | Admitting: Gastroenterology

## 2022-03-30 ENCOUNTER — Ambulatory Visit (AMBULATORY_SURGERY_CENTER): Payer: Medicare HMO | Admitting: *Deleted

## 2022-03-30 VITALS — Ht 66.0 in | Wt 158.0 lb

## 2022-03-30 DIAGNOSIS — Z86010 Personal history of colonic polyps: Secondary | ICD-10-CM

## 2022-03-30 MED ORDER — PEG 3350-KCL-NA BICARB-NACL 420 G PO SOLR: 4000.0000 mL | Freq: Once | ORAL | 0 refills | Status: AC

## 2022-03-30 NOTE — Progress Notes (Signed)
Patient's pre-visit was done today over the phone with the patient. Name,DOB and address verified. Patient denies any allergies to Eggs and Soy. Patient denies any problems with anesthesia/sedation. Patient is not taking any diet pills or blood thinners. No home Oxygen. Insurance confirmed with patient. ? ?Prep instructions sent to pt's MyChart (if available) & mailed to pt-pt is aware. Patient understands to call us back with any questions or concerns. Patient is aware of our care-partner policy.  ? ?EMMI education assigned to the patient for the procedure, sent to [place].  ? ? ?

## 2022-04-02 ENCOUNTER — Encounter: Payer: Medicare HMO | Admitting: Internal Medicine

## 2022-04-15 ENCOUNTER — Encounter: Payer: Medicare HMO | Admitting: Gastroenterology

## 2022-04-21 ENCOUNTER — Ambulatory Visit (INDEPENDENT_AMBULATORY_CARE_PROVIDER_SITE_OTHER): Payer: Medicare HMO | Admitting: Internal Medicine

## 2022-04-21 ENCOUNTER — Encounter: Payer: Self-pay | Admitting: Internal Medicine

## 2022-04-21 VITALS — BP 126/80 | HR 66 | Temp 98.3°F | Resp 16 | Ht 66.0 in | Wt 157.1 lb

## 2022-04-21 DIAGNOSIS — R0609 Other forms of dyspnea: Secondary | ICD-10-CM

## 2022-04-21 DIAGNOSIS — Z79899 Other long term (current) drug therapy: Secondary | ICD-10-CM

## 2022-04-21 DIAGNOSIS — G4700 Insomnia, unspecified: Secondary | ICD-10-CM | POA: Diagnosis not present

## 2022-04-21 DIAGNOSIS — R7989 Other specified abnormal findings of blood chemistry: Secondary | ICD-10-CM | POA: Diagnosis not present

## 2022-04-21 DIAGNOSIS — G250 Essential tremor: Secondary | ICD-10-CM

## 2022-04-21 DIAGNOSIS — Z0001 Encounter for general adult medical examination with abnormal findings: Secondary | ICD-10-CM | POA: Diagnosis not present

## 2022-04-21 DIAGNOSIS — E782 Mixed hyperlipidemia: Secondary | ICD-10-CM

## 2022-04-21 DIAGNOSIS — Z0000 Encounter for general adult medical examination without abnormal findings: Secondary | ICD-10-CM

## 2022-04-21 LAB — CBC WITH DIFFERENTIAL/PLATELET
Basophils Absolute: 0 10*3/uL (ref 0.0–0.1)
Basophils Relative: 0.9 % (ref 0.0–3.0)
Eosinophils Absolute: 0.1 10*3/uL (ref 0.0–0.7)
Eosinophils Relative: 2.3 % (ref 0.0–5.0)
HCT: 39.4 % (ref 36.0–46.0)
Hemoglobin: 12.8 g/dL (ref 12.0–15.0)
Lymphocytes Relative: 30.8 % (ref 12.0–46.0)
Lymphs Abs: 1.3 10*3/uL (ref 0.7–4.0)
MCHC: 32.6 g/dL (ref 30.0–36.0)
MCV: 82.4 fl (ref 78.0–100.0)
Monocytes Absolute: 0.4 10*3/uL (ref 0.1–1.0)
Monocytes Relative: 9.4 % (ref 3.0–12.0)
Neutro Abs: 2.4 10*3/uL (ref 1.4–7.7)
Neutrophils Relative %: 56.6 % (ref 43.0–77.0)
Platelets: 238 10*3/uL (ref 150.0–400.0)
RBC: 4.78 Mil/uL (ref 3.87–5.11)
RDW: 15.2 % (ref 11.5–15.5)
WBC: 4.2 10*3/uL (ref 4.0–10.5)

## 2022-04-21 LAB — COMPREHENSIVE METABOLIC PANEL
ALT: 11 U/L (ref 0–35)
AST: 19 U/L (ref 0–37)
Albumin: 4.3 g/dL (ref 3.5–5.2)
Alkaline Phosphatase: 64 U/L (ref 39–117)
BUN: 16 mg/dL (ref 6–23)
CO2: 29 mEq/L (ref 19–32)
Calcium: 10.5 mg/dL (ref 8.4–10.5)
Chloride: 106 mEq/L (ref 96–112)
Creatinine, Ser: 0.82 mg/dL (ref 0.40–1.20)
GFR: 69.65 mL/min (ref >60.00)
Glucose, Bld: 87 mg/dL (ref 70–99)
Potassium: 4.3 mEq/L (ref 3.5–5.1)
Sodium: 141 mEq/L (ref 135–145)
Total Bilirubin: 0.5 mg/dL (ref 0.2–1.2)
Total Protein: 6.6 g/dL (ref 6.0–8.3)

## 2022-04-21 LAB — LIPID PANEL
Cholesterol: 158 mg/dL (ref 0–200)
HDL: 62.1 mg/dL (ref >39.00)
LDL Cholesterol: 82 mg/dL (ref 0–99)
NonHDL: 96.08
Total CHOL/HDL Ratio: 3
Triglycerides: 72 mg/dL (ref 0.0–149.0)
VLDL: 14.4 mg/dL (ref 0.0–40.0)

## 2022-04-21 LAB — T4, FREE: Free T4: 0.89 ng/dL (ref 0.60–1.60)

## 2022-04-21 LAB — TSH: TSH: 2.99 u[IU]/mL (ref 0.35–5.50)

## 2022-04-21 MED ORDER — SHINGRIX 50 MCG/0.5ML IM SUSR: 0.5000 mL | Freq: Once | INTRAMUSCULAR | 1 refills | Status: AC

## 2022-04-21 NOTE — Progress Notes (Unsigned)
Subjective:    Patient ID: Jon Gills, female    DOB: 19-Aug-1946, 76 y.o.   MRN: [id number]  DOS:  04/21/2022 Type of visit - description: Here for CPX  Since the last office visit few months ago is feeling well. Saw cardiology and pulmonary for DOE, chart reviewed. She is actually feeling well, DOE resolved, she is more active.   Review of Systems See above   Past Medical History:  Diagnosis Date   Anxiety    Colon polyps    Depression    started ~ 2008 after she lost a daughter   Familial tremor    HTN (hypertension)    well controlled with propranolol   Hyperlipidemia    Osteopenia    Rosacea    sees derm    Past Surgical History:  Procedure Laterality Date   APPENDECTOMY     COLONOSCOPY  02/17/2018   Dr.Armbruster   lenses implant B 2018 Bilateral 2018   POLYPECTOMY     TUBAL LIGATION      Current Outpatient Medications  Medication Instructions   alendronate (FOSAMAX) 70 mg, Oral, Every 7 days, Take with a full glass of water on an empty stomach. Remain upright for 30-45 mins.   ALPRAZolam (XANAX) 0.25-0.5 mg, Oral, At bedtime PRN   ezetimibe-simvastatin (VYTORIN) 10-40 MG tablet 1 tablet, Oral, Daily   fluticasone (FLONASE) 50 MCG/ACT nasal spray 2 sprays, Each Nare, Daily   loperamide (IMODIUM) 2 mg, Oral, Daily   metroNIDAZOLE (METROGEL) 1 % gel 1 application., Topical, Daily   Multiple Vitamins-Minerals (ONE-A-DAY 50 PLUS PO) Oral   primidone (MYSOLINE) 50 MG tablet TAKE 1/2 TABLET AT BEDTIME   propranolol (INDERAL) 10 mg, Oral, Daily   sertraline (ZOLOFT) 100 mg, Oral, Daily       Objective:   Physical Exam BP 126/80   Pulse 66   Temp 98.3 F (36.8 C) (Oral)   Resp 16   Ht '5\' 6"'$  (1.676 m)   Wt 157 lb 2 oz (71.3 kg)   SpO2 97%   BMI 25.36 kg/m  General: Well developed, NAD, BMI noted Neck: No  thyromegaly  HEENT:  Normocephalic . Face symmetric, atraumatic Lungs:  CTA B Normal respiratory effort, no intercostal retractions, no  accessory muscle use. Heart: RRR,  no murmur.  Abdomen:  Not distended, soft, non-tender. No rebound or rigidity.   Lower extremities: no pretibial edema bilaterally  Skin: Exposed areas without rash. Not pale. Not jaundice Neurologic:  alert & oriented X3.  Speech normal, gait appropriate for age and unassisted Strength symmetric and appropriate for age. Mild tremor noted Psych: Cognition and judgment appear intact.  Cooperative with normal attention span and concentration.  Behavior appropriate. No anxious or depressed appearing.     Assessment     ASSESSMENT Hyperlipidemia  Depression , insomnia --onset 2008, lost a daughter Neurology: -Essential tremor- Dr Tat -Hyper reflexion Osteoporosis dexa: 2012, 2014, 01/14/2016: T score -0.7. 11/-2021 T score  (-) 2.4, rx fosamax  Rosacea   Sees dermatology Allergic rhinitis  Chronic Diarrhea: Saw GI, celiac w/u negative,  colonoscopy 01-2018, had polyps, biopsy was negative for colitis, trial with Colestid failed.   +FH CAD  PLAN: Here  for CPX Hyperlipidemia: On Vytorin, checking labs Depression insomnia: On sertraline, seems to be doing well.  On Xanax, UDS and contract today Essential tremor: Currently on low-dose of Inderal and half tablet of primidone. Osteoporosis: On Fosamax, recommend vitamin D supplements DOE: Since last visit saw pulmonary: PFTs  were normal, stopping Inderal and primidone helped the fatigue, side-effect?. She is now back on BB and still feeling ok . No suspicion for OSA. Saw cardiology, echo: Grade 1 diastolic dysfunction, pulmonary pressures normal.  Stress test with no ischemia.  Felt to be deconditioning. Feels better, more active Slight increased TSH: Check TFTs RTC 1 year, sooner if needed  -Td 07-2017 -Pneumonia shot - 2012; prevnar 2015 - zostavax completed, rec  - shingrex -Covid vaccination: Declines   -Cervical cancer screening: no further PAPs per guidelines, pt agrees -Breast cancer  screening:  MMG 10/2021 ([facility]) -CCS: no FH; colonoscopy 2002 (-) ;   Colonoscopy 05/2011, colonoscopy 02/17/2018, next scheduled for June 20223 -Labs:    CMP, FLP, CBC, TSH, T4, UDS -Diet and exercise: Counseling -Fall prevention: Information provided-Advance directives: POA on file        DOE: For the last 6 months, has noted a different on her stamina, gets dyspnea on exertion with task that typically she was able to do without problems. She does not seem volume overloaded, no chest pain or palpitations, no cough or fever. Last EKG: Sinus bradycardia. This could be multifactorial, possibly related to bradycardia versus others.   Plan: Refer to cardiology, further eval?.   Chest x-ray, CBC. I am reluctant to stop the low-dose of propanolol because that helped significantly her essential tremor. Increased TSH: TFTs Depression insomnia: Well-controlled Vaccination: Has not changed her mind about COVID vaccines. RTC 6 months

## 2022-04-21 NOTE — Patient Instructions (Addendum)
Recommend to proceed with Shingrix (shingles) vaccine at your pharmacy- see printed prescription.     GO TO THE LAB : Get the blood work     Watkins, Rattan back for   a physical exam in 1 year    Fall Prevention in the Home, Adult Falls can cause injuries and affect people of all ages. There are many simple things that you can do to make your home safe and to help prevent falls. Ask for help when making these changes, if needed. What actions can I take to prevent falls? General instructions Use good lighting in all rooms. Replace any light bulbs that burn out, turn on lights if it is dark, and use night-lights. Place frequently used items in easy-to-reach places. Lower the shelves around your home if necessary. Set up furniture so that there are clear paths around it. Avoid moving your furniture around. Remove throw rugs and other tripping hazards from the floor. Avoid walking on wet floors. Fix any uneven floor surfaces. Add color or contrast paint or tape to grab bars and handrails in your home. Place contrasting color strips on the first and last steps of staircases. When you use a stepladder, make sure that it is completely opened and that the sides and supports are firmly locked. Have someone hold the ladder while you are using it. Do not climb a closed stepladder. Know where your pets are when moving through your home. What can I do in the bathroom?     Keep the floor dry. Immediately clean up any water that is on the floor. Remove soap buildup in the tub or shower regularly. Use nonskid mats or decals on the floor of the tub or shower. Attach bath mats securely with double-sided, nonslip rug tape. If you need to sit down while you are in the shower, use a plastic, nonslip stool. Install grab bars by the toilet and in the tub and shower. Do not use towel bars as grab bars. What can I do in the bedroom? Make sure that a bedside  light is easy to reach. Do not use oversized bedding that reaches the floor. Have a firm chair that has side arms to use for getting dressed. What can I do in the kitchen? Clean up any spills right away. If you need to reach for something above you, use a sturdy step stool that has a grab bar. Keep electrical cables out of the way. Do not use floor polish or wax that makes floors slippery. If you must use wax, make sure that it is non-skid floor wax. What can I do with my stairs? Do not leave any items on the stairs. Make sure that you have a light switch at the top and the bottom of the stairs. Have them installed if you do not have them. Make sure that there are handrails on both sides of the stairs. Fix handrails that are broken or loose. Make sure that handrails are as long as the staircases. Install non-slip stair treads on all stairs in your home. Avoid having throw rugs at the top or bottom of stairs, or secure the rugs with carpet tape to prevent them from moving. Choose a carpet design that does not hide the edge of steps on the stairs. Check any carpeting to make sure that it is firmly attached to the stairs. Fix any carpet that is loose or worn. What can I do on the outside of my  home? Use bright outdoor lighting. Regularly repair the edges of walkways and driveways and fix any cracks. Remove high doorway thresholds. Trim any shrubbery on the main path into your home. Regularly check that handrails are securely fastened and in good repair. Both sides of all steps should have handrails. Install guardrails along the edges of any raised decks or porches. Clear walkways of debris and clutter, including tools and rocks. Have leaves, snow, and ice cleared regularly. Use sand or salt on walkways during winter months. In the garage, clean up any spills right away, including grease or oil spills. What other actions can I take? Wear closed-toe shoes that fit well and support your feet.  Wear shoes that have rubber soles or low heels. Use mobility aids as needed, such as canes, walkers, scooters, and crutches. Review your medicines with your health care provider. Some medicines can cause dizziness or changes in blood pressure, which increase your risk of falling. Talk with your health care provider about other ways that you can decrease your risk of falls. This may include working with a physical therapist or trainer to improve your strength, balance, and endurance. Where to find more information Centers for Disease Control and Prevention, [employer]AD[employer]: http://www.wolf.info/ National Institute on Aging: http://kim-miller.com/ Contact a health care provider if: You are afraid of falling at home. You feel weak, drowsy, or dizzy at home. You fall at home. Summary There are many simple things that you can do to make your home safe and to help prevent falls. Ways to make your home safe include removing tripping hazards and installing grab bars in the bathroom. Ask for help when making these changes in your home. This information is not intended to replace advice given to you by your health care provider. Make sure you discuss any questions you have with your health care provider. Document Revised: 08/10/2021 Document Reviewed: 06/11/2020 Elsevier Patient Education  Carol Wilson.

## 2022-04-22 ENCOUNTER — Encounter: Payer: Self-pay | Admitting: Internal Medicine

## 2022-04-22 NOTE — Assessment & Plan Note (Signed)
-  Td 07-2017 -Pneumonia shot - 2012; prevnar 2015 - zostavax completed, rec  - shingrex -Covid vaccination: Declines   -Cervical cancer screening: no further PAPs per guidelines, pt agrees -Breast cancer screening:  MMG 10/2021 ([facility]) -CCS: no FH; colonoscopy 2002 (-) ;   Colonoscopy 05/2011, colonoscopy 02/17/2018, next scheduled for June 20223 -Labs:    CMP, FLP, CBC, TSH, T4, UDS -Diet and exercise: Counseling -Fall prevention: Information provided -Advance directives: POA on file

## 2022-04-22 NOTE — Assessment & Plan Note (Signed)
Here  for CPX Hyperlipidemia: On Vytorin, checking labs Depression insomnia: On sertraline, seems to be doing well.  On Xanax, UDS and contract today Essential tremor: Currently on low-dose of Inderal and half tablet of primidone. Osteoporosis: On Fosamax, recommend vitamin D supplements DOE: Since last visit saw pulmonary: PFTs were normal, stopping Inderal and primidone helped the fatigue, side-effect?.  She is now back on BB and still feeling ok . No suspicion for OSA. Saw cardiology, echo: Grade 1 diastolic dysfunction, pulmonary pressures normal.  Stress test with no ischemia.  Felt to be deconditioning. Feels better, more active Slight increased TSH: Check TFTs RTC 1 year, sooner if needed

## 2022-04-23 LAB — DRUG MONITORING PANEL 375977 , URINE

## 2022-04-23 LAB — DM TEMPLATE

## 2022-05-11 ENCOUNTER — Encounter: Payer: Self-pay | Admitting: Gastroenterology

## 2022-05-13 ENCOUNTER — Ambulatory Visit (AMBULATORY_SURGERY_CENTER): Payer: Medicare HMO | Admitting: Gastroenterology

## 2022-05-13 ENCOUNTER — Encounter: Payer: Self-pay | Admitting: Gastroenterology

## 2022-05-13 VITALS — BP 117/68 | HR 65 | Temp 98.6°F | Resp 14 | Ht 66.0 in | Wt 158.0 lb

## 2022-05-13 DIAGNOSIS — Z86010 Personal history of colonic polyps: Secondary | ICD-10-CM | POA: Diagnosis not present

## 2022-05-13 DIAGNOSIS — I10 Essential (primary) hypertension: Secondary | ICD-10-CM | POA: Diagnosis not present

## 2022-05-13 DIAGNOSIS — Z09 Encounter for follow-up examination after completed treatment for conditions other than malignant neoplasm: Secondary | ICD-10-CM

## 2022-05-13 DIAGNOSIS — D123 Benign neoplasm of transverse colon: Secondary | ICD-10-CM

## 2022-05-13 DIAGNOSIS — E785 Hyperlipidemia, unspecified: Secondary | ICD-10-CM | POA: Diagnosis not present

## 2022-05-13 DIAGNOSIS — F419 Anxiety disorder, unspecified: Secondary | ICD-10-CM | POA: Diagnosis not present

## 2022-05-13 MED ORDER — SODIUM CHLORIDE 0.9 % IV SOLN: 500.0000 mL | Freq: Once | INTRAVENOUS | Status: DC

## 2022-05-13 NOTE — Patient Instructions (Signed)
YOU HAD AN ENDOSCOPIC PROCEDURE TODAY AT [facility]:   Refer to the procedure report that was given to you for any specific questions about what was found during the examination.  If the procedure report does not answer your questions, please call your gastroenterologist to clarify.  If you requested that your care partner not be given the details of your procedure findings, then the procedure report has been included in a sealed envelope for you to review at your convenience later.  YOU SHOULD EXPECT: Some feelings of bloating in the abdomen. Passage of more gas than usual.  Walking can help get rid of the air that was put into your GI tract during the procedure and reduce the bloating. If you had a lower endoscopy (such as a colonoscopy or flexible sigmoidoscopy) you may notice spotting of blood in your stool or on the toilet paper. If you underwent a bowel prep for your procedure, you may not have a normal bowel movement for a few days.  Please Note:  You might notice some irritation and congestion in your nose or some drainage.  This is from the oxygen used during your procedure.  There is no need for concern and it should clear up in a day or so.  SYMPTOMS TO REPORT IMMEDIATELY:  Following lower endoscopy (colonoscopy or flexible sigmoidoscopy):  Excessive amounts of blood in the stool  Significant tenderness or worsening of abdominal pains  Swelling of the abdomen that is new, acute  Fever of 100F or higher   For urgent or emergent issues, a gastroenterologist can be reached at any hour by calling [phone]. Do not use MyChart messaging for urgent concerns.    DIET:  We do recommend a small meal at first, but then you may proceed to your regular diet.  Drink plenty of fluids but you should avoid alcoholic beverages for 24 hours.  MEDICATIONS: Continue present medications.  Please see handouts given to you by your recovery nurse.  Thank you for allowing [place] to  provide for your healthcare needs today.  ACTIVITY:  You should plan to take it easy for the rest of today and you should NOT DRIVE or use heavy machinery until tomorrow (because of the sedation medicines used during the test).    FOLLOW UP: Our staff will call the number listed on your records 24-72 hours following your procedure to check on you and address any questions or concerns that you may have regarding the information given to you following your procedure. If we do not reach you, we will leave a message.  We will attempt to reach you two times.  During this call, we will ask if you have developed any symptoms of COVID 19. If you develop any symptoms (ie: fever, flu-like symptoms, shortness of breath, cough etc.) before then, please call [phone].  If you test positive for Covid 19 in the 2 weeks post procedure, please call and report this information to [place].    If any biopsies were taken you will be contacted by phone or by letter within the next 1-3 weeks.  Please call us at [phone] if you have not heard about the biopsies in 3 weeks.    SIGNATURES/CONFIDENTIALITY: You and/or your care partner have signed paperwork which will be entered into your electronic medical record.  These signatures attest to the fact that that the information above on your After Visit Summary has been reviewed and is understood.  Full responsibility of the confidentiality  of this discharge information lies with you and/or your care-partner.  

## 2022-05-13 NOTE — Progress Notes (Signed)
Called to room to assist during endoscopic procedure.  Patient ID and intended procedure confirmed with present staff. Received instructions for my participation in the procedure from the performing physician.  

## 2022-05-13 NOTE — Progress Notes (Signed)
Pt's states no medical or surgical changes since previsit or office visit. 

## 2022-05-13 NOTE — Progress Notes (Signed)
[facility] History and Physical   Primary Care Physician:  Wanda Plump, MD   Reason for Procedure:   History of colon polyps  Plan:    colonoscopy     HPI: Carol Wilson is a 76 y.o. female  here for colonoscopy surveillance - 3 adenomas removed 01/2018. Patient has some chronic mild loose stools controlled with immodium, last exam showed no evidence of microscopic colitis. No family history of colon cancer known. Otherwise feels well without any cardiopulmonary symptoms.    Past Medical History:  Diagnosis Date   Anxiety    Colon polyps    Depression    started ~ 2008 after she lost a daughter   Familial tremor    HTN (hypertension)    well controlled with propranolol   Hyperlipidemia    Osteopenia    Rosacea    sees derm    Past Surgical History:  Procedure Laterality Date   APPENDECTOMY     COLONOSCOPY  02/17/2018   Dr.Marabelle Cushman   lenses implant B 2018 Bilateral 2018   POLYPECTOMY     TUBAL LIGATION      Prior to Admission medications   Medication Sig Start Date End Date Taking? Authorizing Provider  loperamide (IMODIUM) 2 MG capsule Take 2 mg by mouth daily.   Yes [provider]  metroNIDAZOLE (METROGEL) 1 % gel Apply 1 application topically daily. 12/29/12  Yes Paz, Nolon Rod, MD  Multiple Vitamins-Minerals (ONE-A-DAY 50 PLUS PO) Take by mouth.   Yes [provider]  primidone (MYSOLINE) 50 MG tablet TAKE 1/2 TABLET AT BEDTIME Patient taking differently: Take 25 mg by mouth at bedtime. 12/21/21  Yes Paz, Nolon Rod, MD  propranolol (INDERAL) 10 MG tablet Take 10 mg by mouth daily.   Yes [provider]  sertraline (ZOLOFT) 100 MG tablet Take 100 mg by mouth daily.   Yes [provider]  alendronate (FOSAMAX) 70 MG tablet Take 1 tablet (70 mg total) by mouth every 7 (seven) days. Take with a full glass of water on an empty stomach. Remain upright for 30-45 mins. 10/09/20   Wanda Plump, MD  ALPRAZolam Prudy Feeler) 0.5 MG  tablet Take 0.5-1 tablets (0.25-0.5 mg total) by mouth at bedtime as needed for sleep. 06/03/21   Wanda Plump, MD  ezetimibe-simvastatin (VYTORIN) 10-40 MG tablet Take 1 tablet by mouth daily. 07/30/20   Wanda Plump, MD  fluticasone [facility]) 50 MCG/ACT nasal spray Place 2 sprays into both nostrils daily. 02/10/18   Wanda Plump, MD    Current Outpatient Medications  Medication Sig Dispense Refill   loperamide (IMODIUM) 2 MG capsule Take 2 mg by mouth daily.     metroNIDAZOLE (METROGEL) 1 % gel Apply 1 application topically daily. 60 g 12   Multiple Vitamins-Minerals (ONE-A-DAY 50 PLUS PO) Take by mouth.     primidone (MYSOLINE) 50 MG tablet TAKE 1/2 TABLET AT BEDTIME (Patient taking differently: Take 25 mg by mouth at bedtime.) 45 tablet 1   propranolol (INDERAL) 10 MG tablet Take 10 mg by mouth daily.     sertraline (ZOLOFT) 100 MG tablet Take 100 mg by mouth daily.     alendronate (FOSAMAX) 70 MG tablet Take 1 tablet (70 mg total) by mouth every 7 (seven) days. Take with a full glass of water on an empty stomach. Remain upright for 30-45 mins. 12 tablet 3   ALPRAZolam (XANAX) 0.5 MG tablet Take 0.5-1 tablets (0.25-0.5 mg total) by mouth at bedtime  as needed for sleep. 90 tablet 0   ezetimibe-simvastatin (VYTORIN) 10-40 MG tablet Take 1 tablet by mouth daily. 90 tablet 1   fluticasone (FLONASE) 50 MCG/ACT nasal spray Place 2 sprays into both nostrils daily. 48 g 3   Current Facility-Administered Medications  Medication Dose Route Frequency Provider Last Rate Last Admin   0.9 %  sodium chloride infusion  500 mL Intravenous Once Isaih Bulger, Willaim Rayas, MD        Allergies as of 05/13/2022 - Review Complete 05/13/2022  Allergen Reaction Noted   Sulfa antibiotics Rash 06/08/2011    Family History  Problem Relation Age of Onset   Coronary artery disease Mother        M 58y/o and F 68y/o   Heart attack Father    Dementia Sister    Hyperlipidemia Sister    Osteoporosis Sister    Alcohol abuse  Brother    Lung cancer Daughter    Tuberculosis Daughter    Diabetes Neg Hx    Hypertension Neg Hx    Stroke Neg Hx    Colon cancer Neg Hx    Breast cancer Neg Hx    Stomach cancer Neg Hx     Social History   Socioeconomic History   Marital status: Married    Spouse name: Not on file   Number of children: 2   Years of education: Not on file   Highest education level: Not on file  Occupational History   Occupation: retired-- cooks for the school    Employer: [employer]  Tobacco Use   Smoking status: Never   Smokeless tobacco: Never  Vaping Use   Vaping Use: Never used  Substance and Sexual Activity   Alcohol use: Not Currently    Comment: occ wine   Drug use: No   Sexual activity: Not Currently  Other Topics Concern   Not on file  Social History Narrative   Very active   Household: pt, husband       lost her   daughter to lung cancer ~ 2008, lost a 84 old month baby years ago , no living children, takes care of  his Barnetta Hammersmith, 76 y/o   Social Determinants of Health   Financial Resource Strain: Low Risk  (01/04/2022)   Overall Financial Resource Strain (CARDIA)    Difficulty of Paying Living Expenses: Not hard at all  Food Insecurity: No Food Insecurity (01/04/2022)   Hunger Vital Sign    Worried About Running Out of Food in the Last Year: Never true    Ran Out of Food in the Last Year: Never true  Transportation Needs: No Transportation Needs (01/04/2022)   PRAPARE - Administrator, Civil Service (Medical): No    Lack of Transportation (Non-Medical): No  Physical Activity: Insufficiently Active (01/04/2022)   Exercise Vital Sign    Days of Exercise per Week: 2 days    Minutes of Exercise per Session: 60 min  Stress: No Stress Concern Present (01/04/2022)   [employer] of Occupational Health - Occupational Stress Questionnaire    Feeling of Stress : Not at all  Social Connections: Socially Integrated (01/04/2022)   Social Connection and  Isolation Panel [NHANES]    Frequency of Communication with Friends and Family: More than three times a week    Frequency of Social Gatherings with Friends and Family: Three times a week    Attends Religious Services: More than 4 times per year    Active Member of Clubs  or Organizations: Yes    Attends Banker Meetings: More than 4 times per year    Marital Status: Married  Catering manager Violence: Not At Risk (01/04/2022)   Humiliation, Afraid, Rape, and Kick questionnaire    Fear of Current or Ex-Partner: No    Emotionally Abused: No    Physically Abused: No    Sexually Abused: No    Review of Systems: All other review of systems negative except as mentioned in the HPI.  Physical Exam: Vital signs BP (!) 106/57   Pulse 75   Temp 98.6 F (37 C)   Ht 5\' 6"  (1.676 m)   Wt 158 lb (71.7 kg)   SpO2 94%   BMI 25.50 kg/m   General:   Alert,  Well-developed, pleasant and cooperative in NAD Lungs:  Clear throughout to auscultation.   Heart:  Regular rate and rhythm Abdomen:  Soft, nontender and nondistended.   Neuro/Psych:  Alert and cooperative. Normal mood and affect. A and O x 3  Harlin Rain, MD [facility]

## 2022-05-13 NOTE — Op Note (Signed)
[place] Patient Name: Carol Wilson Procedure Date: 05/13/2022 9:50 AM MRN: [id number] Endoscopist: Remo Lipps P. Havery Moros , MD Age: 76 Referring MD:  Date of Birth: 02/11/1946 Gender: Female Account #: [id number] Procedure:                Colonoscopy Indications:              High risk colon cancer surveillance: Personal                            history of colonic polyps - 3 adenomas 01/2018 Medicines:                Monitored Anesthesia Care Procedure:                Pre-Anesthesia Assessment:                           - Prior to the procedure, a History and Physical                            was performed, and patient medications and                            allergies were reviewed. The patient's tolerance of                            previous anesthesia was also reviewed. The risks                            and benefits of the procedure and the sedation                            options and risks were discussed with the patient.                            All questions were answered, and informed consent                            was obtained. Prior Anticoagulants: The patient has                            taken no previous anticoagulant or antiplatelet                            agents. ASA Grade Assessment: II - A patient with                            mild systemic disease. After reviewing the risks                            and benefits, the patient was deemed in                            satisfactory condition to undergo the procedure.  After obtaining informed consent, the colonoscope                            was passed under direct vision. Throughout the                            procedure, the patient's blood pressure, pulse, and                            oxygen saturations were monitored continuously. The                            Olympus PCF-H190DL [id number]) Colonoscope was                            introduced  through the anus and advanced to the the                            cecum, identified by appendiceal orifice and                            ileocecal valve. The colonoscopy was performed                            without difficulty. The patient tolerated the                            procedure well. The quality of the bowel                            preparation was good. The ileocecal valve,                            appendiceal orifice, and rectum were photographed. Scope In: 9:58:51 AM Scope Out: 10:20:41 AM Scope Withdrawal Time: 0 hours 16 minutes 1 second  Total Procedure Duration: 0 hours 21 minutes 50 seconds  Findings:                 The perianal and digital rectal examinations were                            normal.                           Four sessile and flat polyps were found in the                            transverse colon. The polyps were 2 to 4 mm in                            size. These polyps were removed with a cold snare.                            Resection and retrieval were complete.  The colon was tortuous.                           Internal hemorrhoids were found during retroflexion.                           The exam was otherwise without abnormality. Complications:            No immediate complications. Estimated blood loss:                            Minimal. Estimated Blood Loss:     Estimated blood loss was minimal. Impression:               - Four 2 to 4 mm polyps in the transverse colon,                            removed with a cold snare. Resected and retrieved.                           - Tortuous colon.                           - Internal hemorrhoids.                           - The examination was otherwise normal. Recommendation:           - Patient has a contact number available for                            emergencies. The signs and symptoms of potential                            delayed complications were  discussed with the                            patient. Return to normal activities tomorrow.                            Written discharge instructions were provided to the                            patient.                           - Resume previous diet.                           - Continue present medications.                           - Await pathology results. Given no high risk                            lesions on this exam, likely no further  colonoscopies are needed due to age Carlota Raspberry. Jaaron Oleson, MD 05/13/2022 10:25:09 AM This report has been signed electronically.

## 2022-05-13 NOTE — Progress Notes (Signed)
VSS, transported to PACU °

## 2022-05-14 ENCOUNTER — Telehealth: Payer: Self-pay | Admitting: Internal Medicine

## 2022-05-14 ENCOUNTER — Telehealth: Payer: Self-pay | Admitting: *Deleted

## 2022-05-14 NOTE — Telephone Encounter (Signed)
Requesting: alprazolam 0.5mg  Contract: 04/21/22 UDS: 04/21/22 Last Visit: 04/21/22 Next Visit:04/27/23 Last Refill: 06/03/21 #90 and 0RF   Please Advise

## 2022-06-02 ENCOUNTER — Other Ambulatory Visit: Payer: Self-pay | Admitting: Internal Medicine

## 2022-07-28 ENCOUNTER — Ambulatory Visit: Payer: Medicare HMO | Admitting: Cardiology

## 2022-08-14 ENCOUNTER — Other Ambulatory Visit: Payer: Self-pay | Admitting: Internal Medicine

## 2023-01-10 ENCOUNTER — Ambulatory Visit: Payer: Medicare HMO

## 2023-01-11 ENCOUNTER — Ambulatory Visit (INDEPENDENT_AMBULATORY_CARE_PROVIDER_SITE_OTHER): Payer: Medicare HMO | Admitting: *Deleted

## 2023-01-11 DIAGNOSIS — Z0000 Encounter for general adult medical examination without abnormal findings: Secondary | ICD-10-CM | POA: Diagnosis not present

## 2023-01-11 NOTE — Patient Instructions (Signed)
Carol Wilson , Thank you for taking time to come for your Medicare Wellness Visit. I appreciate your ongoing commitment to your health goals. Please review the following plan we discussed and let me know if I can assist you in the future.   These are the goals we discussed:  Goals      DIET - REDUCE SUGAR INTAKE     Maintain healthy active lifestyle.        This is a list of the screening recommended for you and due dates:  Health Maintenance  Topic Date Due   Zoster (Shingles) Vaccine (1 of 2) Never done   Mammogram  11/17/2022   Flu Shot  02/20/2023*   Medicare Annual Wellness Visit  01/12/2024   Colon Cancer Screening  05/13/2025   DTaP/Tdap/Td vaccine (3 - Tdap) 08/19/2027   Pneumonia Vaccine  Completed   DEXA scan (bone density measurement)  Completed   Hepatitis C Screening: USPSTF Recommendation to screen - Ages 53-79 yo.  Completed   HPV Vaccine  Aged Out   COVID-19 Vaccine  Discontinued  *Topic was postponed. The date shown is not the original due date.     Next appointment: Follow up in one year for your annual wellness visit.   Preventive Care 77 Years and Older, Female Preventive care refers to lifestyle choices and visits with your health care provider that can promote health and wellness. What does preventive care include? A yearly physical exam. This is also called an annual well check. Dental exams once or twice a year. Routine eye exams. Ask your health care provider how often you should have your eyes checked. Personal lifestyle choices, including: Daily care of your teeth and gums. Regular physical activity. Eating a healthy diet. Avoiding tobacco and drug use. Limiting alcohol use. Practicing safe sex. Taking low-dose aspirin every day. Taking vitamin and mineral supplements as recommended by your health care provider. What happens during an annual well check? The services and screenings done by your health care provider during your annual well  check will depend on your age, overall health, lifestyle risk factors, and family history of disease. Counseling  Your health care provider may ask you questions about your: Alcohol use. Tobacco use. Drug use. Emotional well-being. Home and relationship well-being. Sexual activity. Eating habits. History of falls. Memory and ability to understand (cognition). Work and work Statistician. Reproductive health. Screening  You may have the following tests or measurements: Height, weight, and BMI. Blood pressure. Lipid and cholesterol levels. These may be checked every 5 years, or more frequently if you are over 77 years old old. Skin check. Lung cancer screening. You may have this screening every year starting at age 77 if you have a 30-pack-year history of smoking and currently smoke or have quit within the past 15 years. Fecal occult blood test (FOBT) of the stool. You may have this test every year starting at age 77. Flexible sigmoidoscopy or colonoscopy. You may have a sigmoidoscopy every 5 years or a colonoscopy every 10 years starting at age 77. Hepatitis C blood test. Hepatitis B blood test. Sexually transmitted disease (STD) testing. Diabetes screening. This is done by checking your blood sugar (glucose) after you have not eaten for a while (fasting). You may have this done every 1-3 years. Bone density scan. This is done to screen for osteoporosis. You may have this done starting at age 77. Mammogram. This may be done every 1-2 years. Talk to your health care provider about how often you  should have regular mammograms. Talk with your health care provider about your test results, treatment options, and if necessary, the need for more tests. Vaccines  Your health care provider may recommend certain vaccines, such as: Influenza vaccine. This is recommended every year. Tetanus, diphtheria, and acellular pertussis (Tdap, Td) vaccine. You may need a Td booster every 10 years. Zoster  vaccine. You may need this after age 77. Pneumococcal 13-valent conjugate (PCV13) vaccine. One dose is recommended after age 77. Pneumococcal polysaccharide (PPSV23) vaccine. One dose is recommended after age 77. Talk to your health care provider about which screenings and vaccines you need and how often you need them. This information is not intended to replace advice given to you by your health care provider. Make sure you discuss any questions you have with your health care provider. Document Released: 12/05/2015 Document Revised: 07/28/2016 Document Reviewed: 09/09/2015 Elsevier Interactive Patient Education  2017 [place] Prevention in the Home Falls can cause injuries. They can happen to people of all ages. There are many things you can do to make your home safe and to help prevent falls. What can I do on the outside of my home? Regularly fix the edges of walkways and driveways and fix any cracks. Remove anything that might make you trip as you walk through a door, such as a raised step or threshold. Trim any bushes or trees on the path to your home. Use bright outdoor lighting. Clear any walking paths of anything that might make someone trip, such as rocks or tools. Regularly check to see if handrails are loose or broken. Make sure that both sides of any steps have handrails. Any raised decks and porches should have guardrails on the edges. Have any leaves, snow, or ice cleared regularly. Use sand or salt on walking paths during winter. Clean up any spills in your garage right away. This includes oil or grease spills. What can I do in the bathroom? Use night lights. Install grab bars by the toilet and in the tub and shower. Do not use towel bars as grab bars. Use non-skid mats or decals in the tub or shower. If you need to sit down in the shower, use a plastic, non-slip stool. Keep the floor dry. Clean up any water that spills on the floor as soon as it happens. Remove  soap buildup in the tub or shower regularly. Attach bath mats securely with double-sided non-slip rug tape. Do not have throw rugs and other things on the floor that can make you trip. What can I do in the bedroom? Use night lights. Make sure that you have a light by your bed that is easy to reach. Do not use any sheets or blankets that are too big for your bed. They should not hang down onto the floor. Have a firm chair that has side arms. You can use this for support while you get dressed. Do not have throw rugs and other things on the floor that can make you trip. What can I do in the kitchen? Clean up any spills right away. Avoid walking on wet floors. Keep items that you use a lot in easy-to-reach places. If you need to reach something above you, use a strong step stool that has a grab bar. Keep electrical cords out of the way. Do not use floor polish or wax that makes floors slippery. If you must use wax, use non-skid floor wax. Do not have throw rugs and other things on the  floor that can make you trip. What can I do with my stairs? Do not leave any items on the stairs. Make sure that there are handrails on both sides of the stairs and use them. Fix handrails that are broken or loose. Make sure that handrails are as long as the stairways. Check any carpeting to make sure that it is firmly attached to the stairs. Fix any carpet that is loose or worn. Avoid having throw rugs at the top or bottom of the stairs. If you do have throw rugs, attach them to the floor with carpet tape. Make sure that you have a light switch at the top of the stairs and the bottom of the stairs. If you do not have them, ask someone to add them for you. What else can I do to help prevent falls? Wear shoes that: Do not have high heels. Have rubber bottoms. Are comfortable and fit you well. Are closed at the toe. Do not wear sandals. If you use a stepladder: Make sure that it is fully opened. Do not climb a  closed stepladder. Make sure that both sides of the stepladder are locked into place. Ask someone to hold it for you, if possible. Clearly mark and make sure that you can see: Any grab bars or handrails. First and last steps. Where the edge of each step is. Use tools that help you move around (mobility aids) if they are needed. These include: Canes. Walkers. Scooters. Crutches. Turn on the lights when you go into a dark area. Replace any light bulbs as soon as they burn out. Set up your furniture so you have a clear path. Avoid moving your furniture around. If any of your floors are uneven, fix them. If there are any pets around you, be aware of where they are. Review your medicines with your doctor. Some medicines can make you feel dizzy. This can increase your chance of falling. Ask your doctor what other things that you can do to help prevent falls. This information is not intended to replace advice given to you by your health care provider. Make sure you discuss any questions you have with your health care provider. Document Released: 09/04/2009 Document Revised: 04/15/2016 Document Reviewed: 12/13/2014 Elsevier Interactive Patient Education  2017 [employer].

## 2023-01-11 NOTE — Progress Notes (Signed)
Subjective:   Carol Wilson is a 77 y.o. female who presents for Medicare Annual (Subsequent) preventive examination.  I connected with  JULLIE DESHOTELS on 01/11/23 by a audio enabled telemedicine application and verified that I am speaking with the correct person using two identifiers.  Patient Location: Home  Provider Location: Office/Clinic  I discussed the limitations of evaluation and management by telemedicine. The patient expressed understanding and agreed to proceed.   Review of Systems    Defer to PCP Cardiac Risk Factors include: advanced age (>2mn, >>15women);dyslipidemia     Objective:    There were no vitals filed for this visit. There is no height or weight on file to calculate BMI.     01/11/2023    2:20 PM 01/04/2022    2:26 PM 01/17/2020   11:02 AM 01/15/2019    8:11 AM 02/17/2018    8:40 AM 01/12/2018    9:13 AM 01/10/2017   10:08 AM  Advanced Directives  Does Patient Have a Medical Advance Directive? Yes Yes Yes Yes No Yes Yes  Type of AParamedicof APlattevilleLiving will HRoffLiving will HCedar CrestLiving will HClancyLiving will  HFort MontgomeryLiving will HHendricksLiving will  Does patient want to make changes to medical advance directive? No - Patient declined  No - Patient declined No - Patient declined     Copy of HCalumetin Chart? Yes - validated most recent copy scanned in chart (See row information) Yes - validated most recent copy scanned in chart (See row information) Yes - validated most recent copy scanned in chart (See row information) Yes - validated most recent copy scanned in chart (See row information)  No - copy requested No - copy requested  Would patient like information on creating a medical advance directive?     No - Patient declined      Current Medications (verified) Outpatient Encounter  Medications as of 01/11/2023  Medication Sig   alendronate (FOSAMAX) 70 MG tablet Take 1 tablet (70 mg total) by mouth every 7 (seven) days. Take with a full glass of water on an empty stomach. Remain upright for 30-45 mins.   ALPRAZolam (XANAX) 0.5 MG tablet TAKE 1/2 TO 1 TABLET AT BEDTIME AS NEEDED FOR SLEEP   ezetimibe-simvastatin (VYTORIN) 10-40 MG tablet Take 1 tablet by mouth daily.   fluticasone (FLONASE) 50 MCG/ACT nasal spray Place 2 sprays into both nostrils daily.   loperamide (IMODIUM) 2 MG capsule Take 2 mg by mouth daily.   metroNIDAZOLE (METROGEL) 1 % gel Apply 1 application topically daily.   Multiple Vitamins-Minerals (ONE-A-DAY 50 PLUS PO) Take by mouth.   primidone (MYSOLINE) 50 MG tablet Take 0.5 tablets (25 mg total) by mouth at bedtime.   propranolol (INDERAL) 10 MG tablet TAKE 1 TABLET EVERY DAY   sertraline (ZOLOFT) 100 MG tablet TAKE 1 TABLET EVERY DAY   Facility-Administered Encounter Medications as of 01/11/2023  Medication   0.9 %  sodium chloride infusion    Allergies (verified) Sulfa antibiotics   History: Past Medical History:  Diagnosis Date   Anxiety    Colon polyps    Depression    started ~ 2008 after she lost a daughter   Familial tremor    HTN (hypertension)    well controlled with propranolol   Hyperlipidemia    Osteopenia    Rosacea    sees derm  Past Surgical History:  Procedure Laterality Date   APPENDECTOMY     COLONOSCOPY  02/17/2018   Dr.Armbruster   lenses implant B 2018 Bilateral 2018   POLYPECTOMY     TUBAL LIGATION     Family History  Problem Relation Age of Onset   Coronary artery disease Mother        M 58y/o and F 68y/o   Heart attack Father    Dementia Sister    Hyperlipidemia Sister    Osteoporosis Sister    Alcohol abuse Brother    Lung cancer Daughter    Tuberculosis Daughter    Diabetes Neg Hx    Hypertension Neg Hx    Stroke Neg Hx    Colon cancer Neg Hx    Breast cancer Neg Hx    Stomach cancer Neg  Hx    Social History   Socioeconomic History   Marital status: Married    Spouse name: Not on file   Number of children: 2   Years of education: Not on file   Highest education level: Not on file  Occupational History   Occupation: retired-- cooks for the school    Employer: [employer]  Tobacco Use   Smoking status: Never   Smokeless tobacco: Never  Vaping Use   Vaping Use: Never used  Substance and Sexual Activity   Alcohol use: Not Currently    Comment: occ wine   Drug use: No   Sexual activity: Not Currently  Other Topics Concern   Not on file  Social History Narrative   Very active   Household: pt, husband       lost her   daughter to lung cancer ~ 2008, lost a 19 old month baby years ago , no living children, takes care of  his [name], 77 y/o   Social Determinants of Health   Financial Resource Strain: Low Risk  (01/04/2022)   Overall Financial Resource Strain (CARDIA)    Difficulty of Paying Living Expenses: Not hard at all  Food Insecurity: No Food Insecurity (01/11/2023)   Hunger Vital Sign    Worried About Running Out of Food in the Last Year: Never true    Ran Out of Food in the Last Year: Never true  Transportation Needs: No Transportation Needs (01/11/2023)   PRAPARE - Hydrologist (Medical): No    Lack of Transportation (Non-Medical): No  Physical Activity: Insufficiently Active (01/04/2022)   Exercise Vital Sign    Days of Exercise per Week: 2 days    Minutes of Exercise per Session: 60 min  Stress: No Stress Concern Present (01/04/2022)   [place]    Feeling of Stress : Not at all  Social Connections: [place] (01/04/2022)   Social Connection and Isolation Panel [NHANES]    Frequency of Communication with Friends and Family: More than three times a week    Frequency of Social Gatherings with Friends and Family: Three times a week    Attends  Religious Services: More than 4 times per year    Active Member of Clubs or Organizations: Yes    Attends Music therapist: More than 4 times per year    Marital Status: Married    Tobacco Counseling Counseling given: Not Answered   Clinical Intake:  Pre-visit preparation completed: Yes  Pain : No/denies pain  Diabetes: No  How often do you need to have someone help you when  you read instructions, pamphlets, or other written materials from your doctor or pharmacy?: 1 - Never   Activities of Daily Living    01/11/2023    2:24 PM  In your present state of health, do you have any difficulty performing the following activities:  Hearing? 0  Vision? 0  Difficulty concentrating or making decisions? 0  Walking or climbing stairs? 0  Dressing or bathing? 0  Doing errands, shopping? 0  Preparing Food and eating ? N  Using the Toilet? N  In the past six months, have you accidently leaked urine? N  Do you have problems with loss of bowel control? N  Managing your Medications? N  Managing your Finances? N  Housekeeping or managing your Housekeeping? N    Patient Care Team: Colon Branch, MD as PCP - General (Internal Medicine) Tat, Eustace Quail, DO as Consulting Physician (Neurology)  Indicate any recent Medical Services you may have received from other than [facility] providers in the past year (date may be approximate).     Assessment:   This is a routine wellness examination for Ceaira.  Hearing/Vision screen No results found.  Dietary issues and exercise activities discussed: Current Exercise Habits: Home exercise routine, Type of exercise: strength training/weights;calisthenics, Time (Minutes): 60, Frequency (Times/Week): 3, Weekly Exercise (Minutes/Week): 180, Intensity: Mild, Exercise limited by: None identified   Goals Addressed   None    Depression Screen    01/11/2023    2:23 PM 04/21/2022    9:54 AM 01/04/2022    2:29 PM 06/16/2021   10:41 AM 02/19/2021     8:44 AM 01/17/2020   11:06 AM 01/15/2019    8:11 AM  PHQ 2/9 Scores  PHQ - 2 Score 0 0 0 0 0 0 0  PHQ- 9 Score    3 4      Fall Risk    01/11/2023    2:21 PM 04/21/2022    9:54 AM 01/04/2022    2:27 PM 06/16/2021   10:06 AM 02/19/2021    8:10 AM  Fall Risk   Falls in the past year? 0 0 0 0 0  Number falls in past yr: 0 0 0 0 0  Injury with Fall? 0 0 0 0 0  Risk for fall due to : No Fall Risks      Follow up Falls evaluation completed Falls evaluation completed Falls prevention discussed Falls evaluation completed Falls evaluation completed    [place]:  Any stairs in or around the home? Yes  If so, are there any without handrails? No  Home free of loose throw rugs in walkways, pet beds, electrical cords, etc? Yes  Adequate lighting in your home to reduce risk of falls? Yes   ASSISTIVE DEVICES UTILIZED TO PREVENT FALLS:  Life alert? No  Use of a cane, walker or w/c? No  Grab bars in the bathroom? No  Shower chair or bench in shower? No  Elevated toilet seat or a handicapped toilet?  Comfort height  TIMED UP AND GO:  Was the test performed?  No, audio visit .    Cognitive Function:    01/12/2018    9:14 AM 01/10/2017   10:09 AM  MMSE - Mini Mental State Exam  Orientation to time 5 5  Orientation to Place 5 5  Registration 3 3  Attention/ Calculation 5 5  Recall 3 1  Language- name 2 objects 2 2  Language- repeat 1 1  Language-  follow 3 step command 3 3  Language- read & follow direction 1 1  Write a sentence 1 1  Copy design 1 1  Total score 30 28        01/11/2023    2:27 PM  6CIT Screen  What Year? 0 points  What month? 0 points  What time? 0 points  Count back from 20 0 points  Months in reverse 0 points  Repeat phrase 2 points  Total Score 2 points    Immunizations Immunization History  Administered Date(s) Administered   Fluad Quad(high Dose 65+) 08/28/2020   H1N1 11/07/2008   Influenza Whole 08/22/2009    Influenza, High Dose Seasonal PF 08/16/2013, 08/13/2016, 08/18/2017, 08/16/2018, 08/22/2019   Influenza,inj,Quad PF,6+ Mos 08/06/2014, 08/13/2015   Pneumococcal Conjugate-13 08/06/2014   Pneumococcal Polysaccharide-23 06/08/2011   Td 02/22/2007, 08/18/2017   Zoster, Live 12/15/2009    TDAP status: Up to date  Flu Vaccine status: Declined, Education has been provided regarding the importance of this vaccine but patient still declined. Advised may receive this vaccine at local pharmacy or Health Dept. Aware to provide a copy of the vaccination record if obtained from local pharmacy or Health Dept. Verbalized acceptance and understanding.  Pneumococcal vaccine status: Up to date  Covid-19 vaccine status: Declined, Education has been provided regarding the importance of this vaccine but patient still declined. Advised may receive this vaccine at local pharmacy or Health Dept.or vaccine clinic. Aware to provide a copy of the vaccination record if obtained from local pharmacy or Health Dept. Verbalized acceptance and understanding.  Qualifies for Shingles Vaccine? Yes   Zostavax completed Yes   Shingrix Completed?: No.    Education has been provided regarding the importance of this vaccine. Patient has been advised to call insurance company to determine out of pocket expense if they have not yet received this vaccine. Advised may also receive vaccine at local pharmacy or Health Dept. Verbalized acceptance and understanding.  Screening Tests Health Maintenance  Topic Date Due   Zoster Vaccines- Shingrix (1 of 2) Never done   MAMMOGRAM  11/17/2022   Medicare Annual Wellness (AWV)  01/04/2023   INFLUENZA VACCINE  02/20/2023 (Originally 06/22/2022)   COLONOSCOPY (Pts 45-10yr Insurance coverage will need to be confirmed)  05/13/2025   DTaP/Tdap/Td (3 - Tdap) 08/19/2027   Pneumonia Vaccine 77 Years old  Completed   DEXA SCAN  Completed   Hepatitis C Screening  Completed   HPV VACCINES  Aged Out    COVID-19 Vaccine  Discontinued    Health Maintenance  Health Maintenance Due  Topic Date Due   Zoster Vaccines- Shingrix (1 of 2) Never done   MAMMOGRAM  11/17/2022   Medicare Annual Wellness (AWV)  01/04/2023    Colorectal cancer screening: Type of screening: Colonoscopy. Completed 05/13/22. Repeat every 3 years  Mammogram status: Completed 11/17/21. Repeat every year  Bone Density status: Completed 10/07/20. Results reflect: Bone density results: OSTEOPENIA. Repeat every 2 years.  Lung Cancer Screening: (Low Dose CT Chest recommended if Age 77-80years, 30 pack-year currently smoking OR have quit w/in 15years.) does not qualify.   Additional Screening:  Hepatitis C Screening: does qualify; Completed 08/13/15  Vision Screening: Recommended annual ophthalmology exams for early detection of glaucoma and other disorders of the eye. Is the patient up to date with their annual eye exam?  Yes  Who is the provider or what is the name of the office in which the patient attends annual eye exams? Dr. GKaty FitchIf  pt is not established with a provider, would they like to be referred to a provider to establish care? No .   Dental Screening: Recommended annual dental exams for proper oral hygiene  Community Resource Referral / Chronic Care Management: CRR required this visit?  No   CCM required this visit?  No      Plan:     I have personally reviewed and noted the following in the patient's chart:   Medical and social history Use of alcohol, tobacco or illicit drugs  Current medications and supplements including opioid prescriptions. Patient is not currently taking opioid prescriptions. Functional ability and status Nutritional status Physical activity Advanced directives List of other physicians Hospitalizations, surgeries, and ER visits in previous 12 months Vitals Screenings to include cognitive, depression, and falls Referrals and appointments  In addition, I have  reviewed and discussed with patient certain preventive protocols, quality metrics, and best practice recommendations. A written personalized care plan for preventive services as well as general preventive health recommendations were provided to patient.   Due to this being a telephonic visit, the after visit summary with patients personalized plan was offered to patient via mail or my-chart. Patient would like to access on my-chart.  Beatris Ship, [place]   01/11/2023   Nurse Notes: None

## 2023-01-20 ENCOUNTER — Encounter: Payer: Self-pay | Admitting: Internal Medicine

## 2023-02-14 ENCOUNTER — Other Ambulatory Visit: Payer: Self-pay | Admitting: Internal Medicine

## 2023-02-14 DIAGNOSIS — Z139 Encounter for screening, unspecified: Secondary | ICD-10-CM

## 2023-02-15 ENCOUNTER — Ambulatory Visit
Admission: RE | Admit: 2023-02-15 | Discharge: 2023-02-15 | Disposition: A | Discharge status: Home / Self Care | Payer: Medicare HMO | Source: Ambulatory Visit | Attending: Internal Medicine | Admitting: Internal Medicine

## 2023-02-15 DIAGNOSIS — Z139 Encounter for screening, unspecified: Secondary | ICD-10-CM

## 2023-02-15 DIAGNOSIS — Z1231 Encounter for screening mammogram for malignant neoplasm of breast: Secondary | ICD-10-CM | POA: Diagnosis not present

## 2023-03-23 ENCOUNTER — Other Ambulatory Visit: Payer: Self-pay | Admitting: Internal Medicine

## 2023-03-26 IMAGING — DX DG CHEST 2V
2 series · 2 of 2 positions shown · non-contrast
Comparison: 05/09/2013

CLINICAL DATA: 75-year-old female with dyspnea on exertion

EXAM:
CHEST - 2 VIEW

[chest pa]
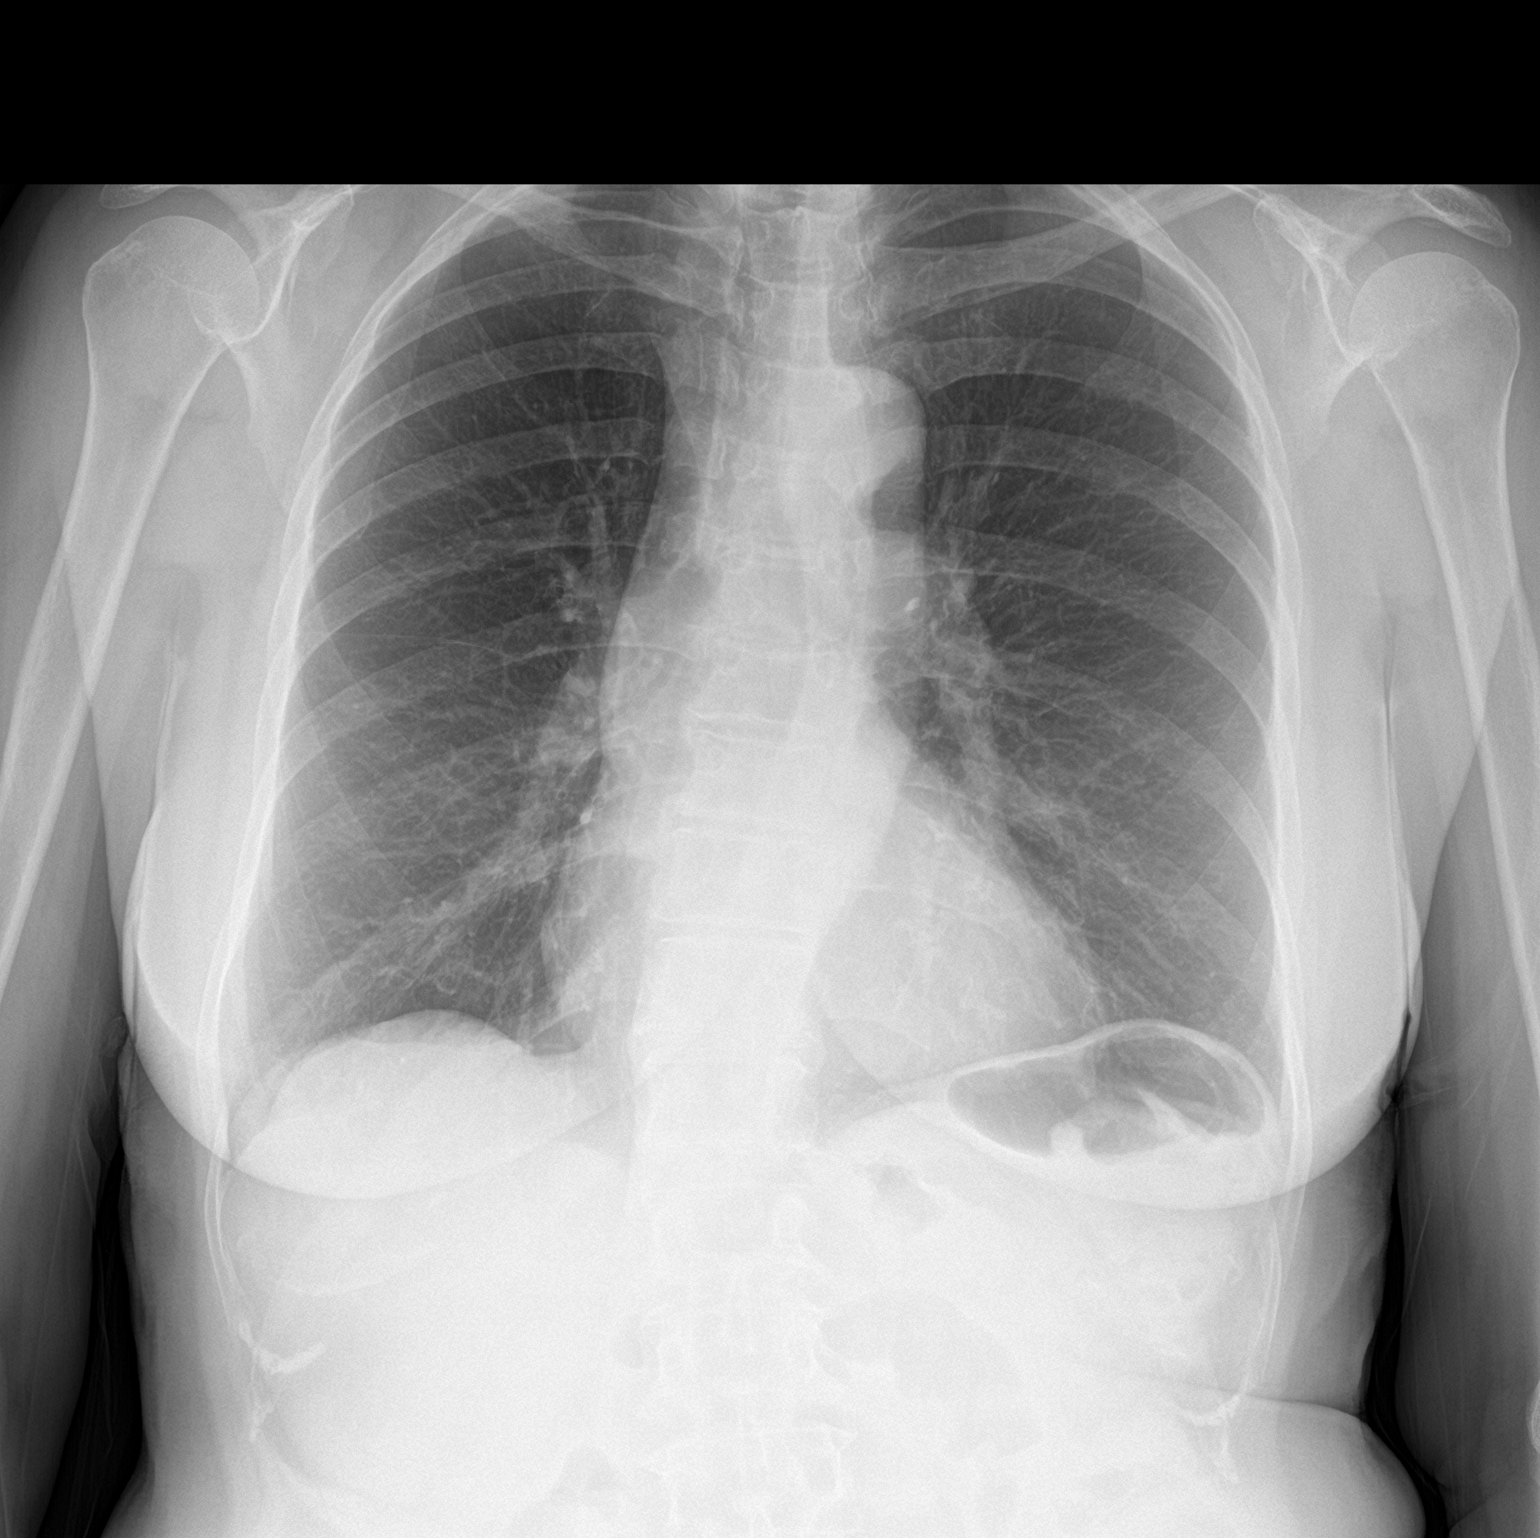

[chest lat]
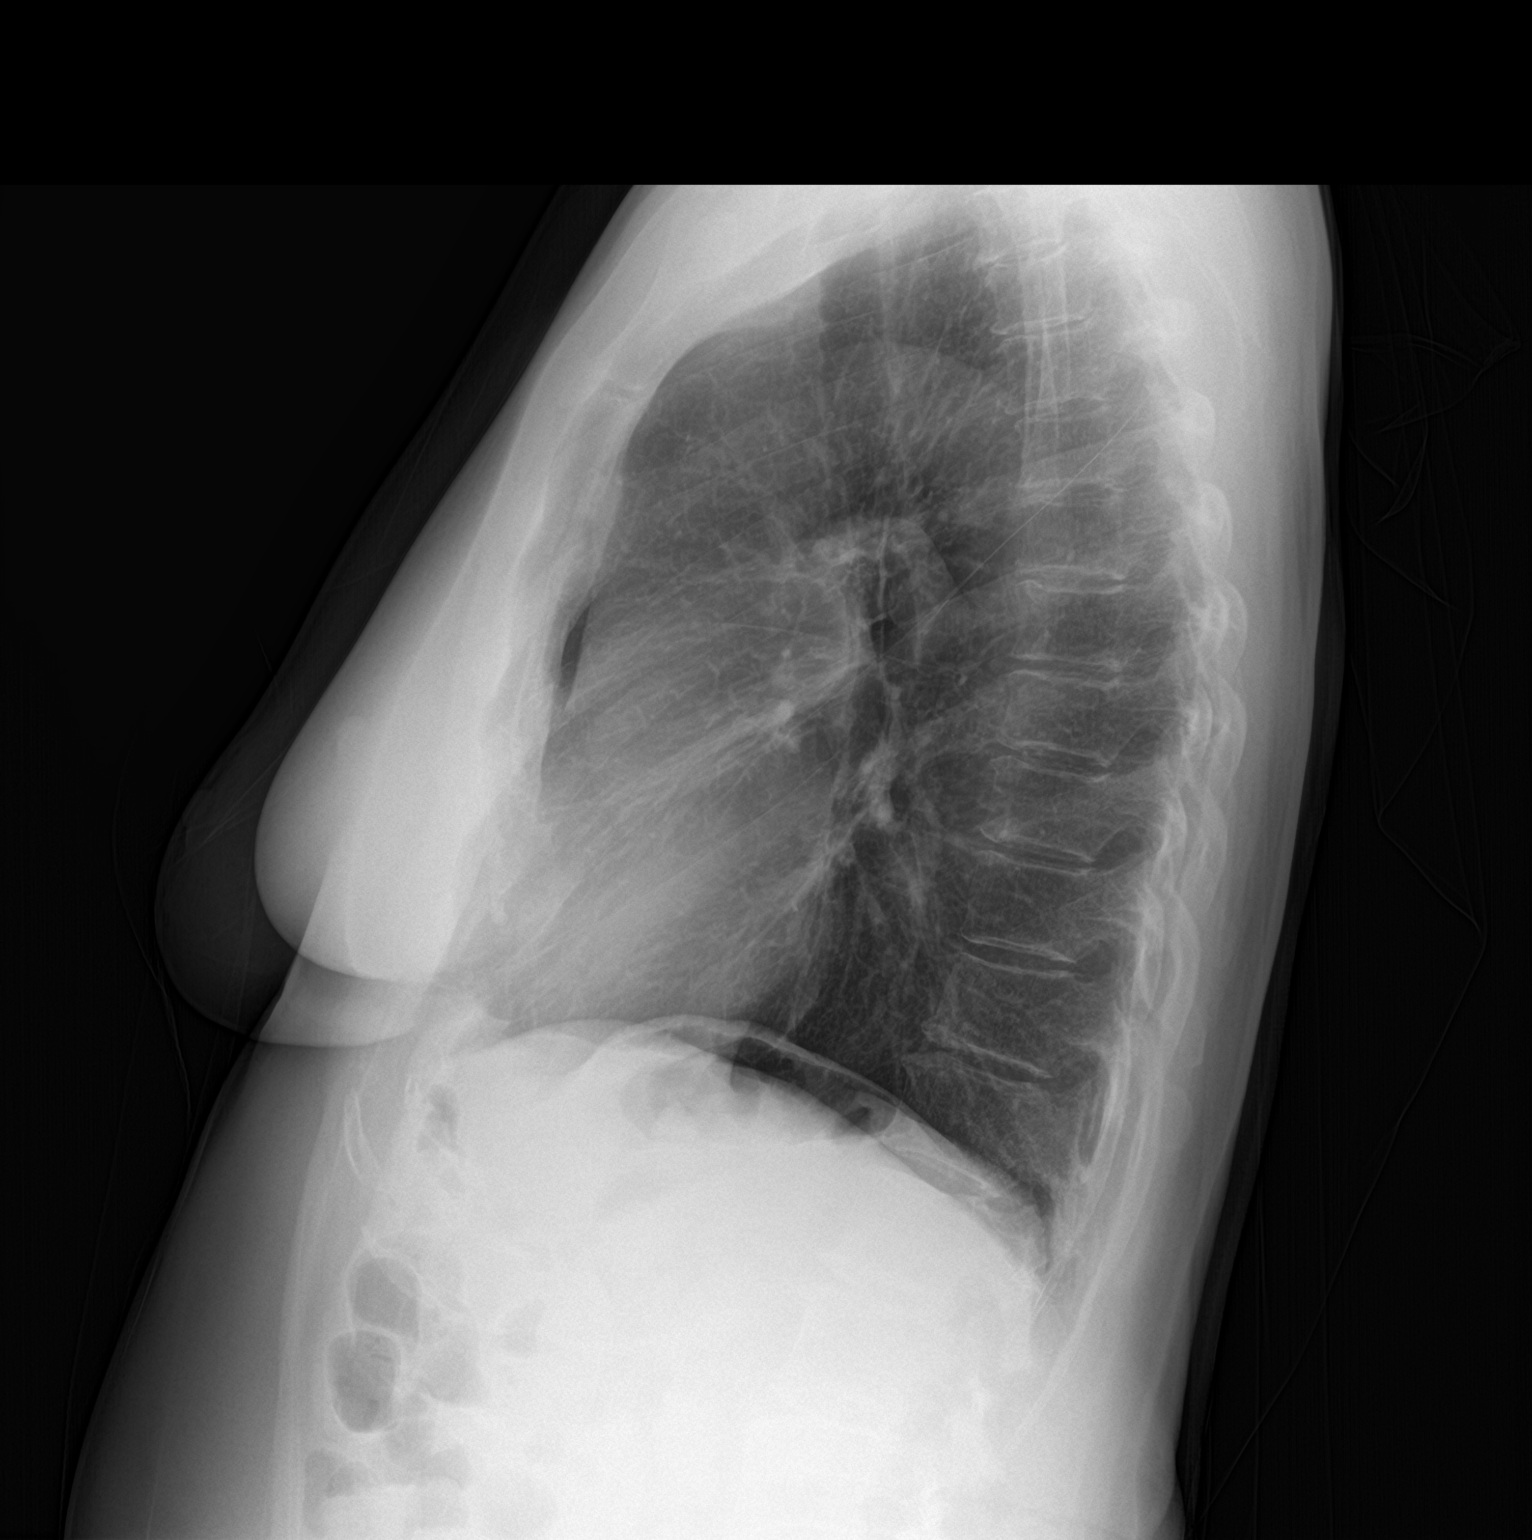

[2 of 2 positions shown; findings below may reference images not displayed]

FINDINGS: Cardiomediastinal silhouette unchanged in size and contour.
Questionable dilation of the ascending aorta. No evidence of central
vascular congestion. No interlobular septal thickening.

No pneumothorax or pleural effusion. Coarsened interstitial
markings, with no confluent airspace disease.

No acute displaced fracture. Degenerative changes of the spine.
IMPRESSION: Negative for acute cardiopulmonary disease.

Questionable dilation of ascending aorta. Further evaluation may be
considered with either contrast (CTA chest) or noncontrast chest CT.
ECHO might alternatively be considered if there is concern for valve
dysfunction contributing to the patient's symptoms.

## 2023-04-27 ENCOUNTER — Ambulatory Visit (INDEPENDENT_AMBULATORY_CARE_PROVIDER_SITE_OTHER): Payer: Medicare HMO | Admitting: Internal Medicine

## 2023-04-27 ENCOUNTER — Encounter: Payer: Self-pay | Admitting: Internal Medicine

## 2023-04-27 VITALS — BP 126/68 | HR 58 | Temp 98.4°F | Resp 16 | Ht 66.0 in | Wt 157.4 lb

## 2023-04-27 DIAGNOSIS — Z0000 Encounter for general adult medical examination without abnormal findings: Secondary | ICD-10-CM

## 2023-04-27 DIAGNOSIS — Z79899 Other long term (current) drug therapy: Secondary | ICD-10-CM

## 2023-04-27 DIAGNOSIS — E782 Mixed hyperlipidemia: Secondary | ICD-10-CM

## 2023-04-27 DIAGNOSIS — R7989 Other specified abnormal findings of blood chemistry: Secondary | ICD-10-CM | POA: Diagnosis not present

## 2023-04-27 DIAGNOSIS — G4700 Insomnia, unspecified: Secondary | ICD-10-CM

## 2023-04-27 LAB — CBC WITH DIFFERENTIAL/PLATELET
Basophils Absolute: 0 10*3/uL (ref 0.0–0.1)
Basophils Relative: 0.9 % (ref 0.0–3.0)
Eosinophils Absolute: 0.1 10*3/uL (ref 0.0–0.7)
Eosinophils Relative: 2 % (ref 0.0–5.0)
HCT: 44 % (ref 36.0–46.0)
Hemoglobin: 14 g/dL (ref 12.0–15.0)
Lymphocytes Relative: 32.3 % (ref 12.0–46.0)
Lymphs Abs: 1.7 10*3/uL (ref 0.7–4.0)
MCHC: 31.9 g/dL (ref 30.0–36.0)
MCV: 87.2 fl (ref 78.0–100.0)
Monocytes Absolute: 0.5 10*3/uL (ref 0.1–1.0)
Monocytes Relative: 9.3 % (ref 3.0–12.0)
Neutro Abs: 2.9 10*3/uL (ref 1.4–7.7)
Neutrophils Relative %: 55.5 % (ref 43.0–77.0)
Platelets: 287 10*3/uL (ref 150.0–400.0)
RBC: 5.05 Mil/uL (ref 3.87–5.11)
RDW: 13.7 % (ref 11.5–15.5)
WBC: 5.2 10*3/uL (ref 4.0–10.5)

## 2023-04-27 LAB — TSH: TSH: 3.85 u[IU]/mL (ref 0.35–5.50)

## 2023-04-27 NOTE — Patient Instructions (Addendum)
Vaccines I recommend: Shingrix (shingles) RSV vaccine Pneumonia shot (PNM 20 COVID-vaccine Flu shot every fall    GO TO THE LAB : Get the blood work     GO TO THE FRONT DESK, PLEASE SCHEDULE YOUR APPOINTMENTS Come back for a physical in 1 year  Fall Prevention in the Home, Adult Falls can cause injuries and affect people of all ages. There are many simple things that you can do to make your home safe and to help prevent falls. If you need it, ask for help making these changes. What actions can I take to prevent falls? General information Use good lighting in all rooms. Make sure to: Replace any light bulbs that burn out. Turn on lights if it is dark and use night-lights. Keep items that you use often in easy-to-reach places. Lower the shelves around your home if needed. Move furniture so that there are clear paths around it. Do not keep throw rugs or other things on the floor that can make you trip. If any of your floors are uneven, fix them. Add color or contrast paint or tape to clearly mark and help you see: Grab bars or handrails. First and last steps of staircases. Where the edge of each step is. If you use a ladder or stepladder: Make sure that it is fully opened. Do not climb a closed ladder. Make sure the sides of the ladder are locked in place. Have someone hold the ladder while you use it. Know where your pets are as you move through your home. What can I do in the bathroom?     Keep the floor dry. Clean up any water that is on the floor right away. Remove soap buildup in the bathtub or shower. Buildup makes bathtubs and showers slippery. Use non-skid mats or decals on the floor of the bathtub or shower. Attach bath mats securely with double-sided, non-slip rug tape. If you need to sit down while you are in the shower, use a non-slip stool. Install grab bars by the toilet and in the bathtub and shower. Do not use towel bars as grab bars. What can I do in the  bedroom? Make sure that you have a light by your bed that is easy to reach. Do not use any sheets or blankets on your bed that hang to the floor. Have a firm bench or chair with side arms that you can use for support when you get dressed. What can I do in the kitchen? Clean up any spills right away. If you need to reach something above you, use a sturdy step stool that has a grab bar. Keep electrical cables out of the way. Do not use floor polish or wax that makes floors slippery. What can I do with my stairs? Do not leave anything on the stairs. Make sure that you have a light switch at the top and the bottom of the stairs. Have them installed if you do not have them. Make sure that there are handrails on both sides of the stairs. Fix handrails that are broken or loose. Make sure that handrails are as long as the staircases. Install non-slip stair treads on all stairs in your home if they do not have carpet. Avoid having throw rugs at the top or bottom of stairs, or secure the rugs with carpet tape to prevent them from moving. Choose a carpet design that does not hide the edge of steps on the stairs. Make sure that carpet is firmly attached to  the stairs. Fix any carpet that is loose or worn. What can I do on the outside of my home? Use bright outdoor lighting. Repair the edges of walkways and driveways and fix any cracks. Clear paths of anything that can make you trip, such as tools or rocks. Add color or contrast paint or tape to clearly mark and help you see high doorway thresholds. Trim any bushes or trees on the main path into your home. Check that handrails are securely fastened and in good repair. Both sides of all steps should have handrails. Install guardrails along the edges of any raised decks or porches. Have leaves, snow, and ice cleared regularly. Use sand, salt, or ice melt on walkways during winter months if you live where there is ice and snow. In the garage, clean up any  spills right away, including grease or oil spills. What other actions can I take? Review your medicines with your health care provider. Some medicines can make you confused or feel dizzy. This can increase your chance of falling. Wear closed-toe shoes that fit well and support your feet. Wear shoes that have rubber soles and low heels. Use a cane, walker, scooter, or crutches that help you move around if needed. Talk with your provider about other ways that you can decrease your risk of falls. This may include seeing a physical therapist to learn to do exercises to improve movement and strength. Where to find more information Centers for Disease Control and Prevention, STEAD[employer]: TonerPromos.no [employer] on Aging: BaseRingTones.pl National Institute on Aging: BaseRingTones.pl Contact a health care provider if: You are afraid of falling at home. You feel weak, drowsy, or dizzy at home. You fall at home. Get help right away if you: Lose consciousness or have trouble moving after a fall. Have a fall that causes a head injury. These symptoms may be an emergency. Get help right away. Call 911. Do not wait to see if the symptoms will go away. Do not drive yourself to the hospital. This information is not intended to replace advice given to you by your health care provider. Make sure you discuss any questions you have with your health care provider. Document Revised: 07/12/2022 Document Reviewed: 07/12/2022 Elsevier Patient Education  2024 [employer].

## 2023-04-27 NOTE — Progress Notes (Unsigned)
Subjective:    Patient ID: Carol Wilson, female    DOB: [birth year], 77 y.o.   MRN: [id number]  DOS:  04/27/2023 Type of visit - description: CPX  Since the last office visit is doing well and has no major.  Concerns.  Specifically denies fever, chest pain, difficulty breathing. No difficulty swallowing or other GI symptoms. Emotionally doing well  Review of Systems See above   Past Medical History:  Diagnosis Date   Anxiety    Colon polyps    Depression    started ~ 2008 after she lost a daughter   Familial tremor    HTN (hypertension)    well controlled with propranolol   Hyperlipidemia    Osteopenia    Rosacea    sees derm    Past Surgical History:  Procedure Laterality Date   APPENDECTOMY     COLONOSCOPY  02/17/2018   Dr.Armbruster   lenses implant B 2018 Bilateral 2018   POLYPECTOMY     TUBAL LIGATION      Current Outpatient Medications  Medication Instructions   alendronate (FOSAMAX) 70 mg, Oral, Every 7 days, Take with a full glass of water on an empty stomach. Remain upright for 30-45 mins.   ALPRAZolam (XANAX) 0.5 MG tablet TAKE 1/2 TO 1 TABLET AT BEDTIME AS NEEDED FOR SLEEP   ezetimibe-simvastatin (VYTORIN) 10-40 MG tablet 1 tablet, Oral, Daily   fluticasone (FLONASE) 50 MCG/ACT nasal spray 2 sprays, Each Nare, Daily   loperamide (IMODIUM) 2 mg, Daily   metroNIDAZOLE (METROGEL) 1 % gel 1 application , Topical, Daily   Multiple Vitamins-Minerals (ONE-A-DAY 50 PLUS PO) Oral   primidone (MYSOLINE) 25 mg, Oral, Daily at bedtime   propranolol (INDERAL) 10 mg, Oral, Daily   sertraline (ZOLOFT) 100 mg, Oral, Daily       Objective:   Physical Exam BP 126/68   Pulse (!) 58   Temp 98.4 F (36.9 C) (Oral)   Resp 16   Ht 5\' 6"  (1.676 m)   Wt 157 lb 6 oz (71.4 kg)   SpO2 96%   BMI 25.40 kg/m  General: Well developed, NAD, BMI noted Neck: No  thyromegaly  HEENT:  Normocephalic . Face symmetric, atraumatic Lungs:  CTA B Normal respiratory  effort, no intercostal retractions, no accessory muscle use. Heart: RRR,  no murmur.  Abdomen:  Not distended, soft, non-tender. No rebound or rigidity.   Lower extremities: no pretibial edema bilaterally  Skin: Exposed areas without rash. Not pale. Not jaundice Neurologic:  alert & oriented X3.  Speech normal, gait appropriate for age and unassisted Strength symmetric and appropriate for age. Tremors are minimal today. Psych: Cognition and judgment appear intact.  Cooperative with normal attention span and concentration.  Behavior appropriate. No anxious or depressed appearing.     Assessment    ASSESSMENT Hyperlipidemia  Depression , insomnia --onset 2008, lost a daughter Neurology: -Essential tremor- Dr Tat -Hyper reflexion Osteoporosis dexa: 2012, 2014, 01/14/2016: T score -0.7. 11/-2021 T score  (-) 2.4, rx fosamax  Rosacea   Sees dermatology Allergic rhinitis  Chronic Diarrhea: Saw GI, celiac w/u negative,  colonoscopy 01-2018, had polyps, biopsy was negative for colitis, trial with Colestid failed.   +FH CAD  PLAN: Here for CPX -Td 07-2017 -Pneumonia shot - 2012; prevnar 2015 -Vaccines I recommend: RSV, PNM 20, COVID, flu shot every fall -Cervical cancer screening: no further PAPs per guidelines, pt agrees -Breast cancer screening:  MMG 01-2023 ([facility]) -CCS: no FH; colonoscopy 2002 (-) ;  Colonoscopy 05/2011, colonoscopy 02/17/2018, C-scope 04/2022.  Next per GI -Labs: CMP FLP CBC TSH -Diet and exercise: Exercises twice a week at church.  Encouraged healthy diet -Fall prevention discussed. -Advance directives: POA on file  Hyperlipidemia: On Vytorin.  Labs Depression, insomnia: Well-controlled on alprazolam, sertraline. Essential tremor: On Inderal and primidone.  Seems to be very good today. Osteoporosis, on Fosamax, started 2021, to finish in 2026.  Vitamin D levels are normal.  She remains active. RTC 1 year.   5-23 Here  for CPX Hyperlipidemia: On Vytorin,  checking labs Depression insomnia: On sertraline, seems to be doing well.  On Xanax, UDS and contract today Essential tremor: Currently on low-dose of Inderal and half tablet of primidone. Osteoporosis: On Fosamax, recommend vitamin D supplements DOE: Since last visit saw pulmonary: PFTs were normal, stopping Inderal and primidone helped the fatigue, side-effect?.  She is now back on BB and still feeling ok . No suspicion for OSA. Saw cardiology, echo: Grade 1 diastolic dysfunction, pulmonary pressures normal.  Stress test with no ischemia.  Felt to be deconditioning. Feels better, more active Slight increased TSH: Check TFTs RTC 1 year, sooner if needed

## 2023-04-28 ENCOUNTER — Encounter: Payer: Self-pay | Admitting: Internal Medicine

## 2023-04-28 LAB — COMPREHENSIVE METABOLIC PANEL
ALT: 10 U/L (ref 0–35)
AST: 19 U/L (ref 0–37)
Albumin: 4.6 g/dL (ref 3.5–5.2)
Alkaline Phosphatase: 75 U/L (ref 39–117)
BUN: 12 mg/dL (ref 6–23)
CO2: 26 mEq/L (ref 19–32)
Calcium: 10.5 mg/dL (ref 8.4–10.5)
Chloride: 104 mEq/L (ref 96–112)
Creatinine, Ser: 0.84 mg/dL (ref 0.40–1.20)
GFR: 67.19 mL/min (ref >60.00)
Glucose, Bld: 84 mg/dL (ref 70–99)
Potassium: 4.3 mEq/L (ref 3.5–5.1)
Sodium: 141 mEq/L (ref 135–145)
Total Bilirubin: 0.6 mg/dL (ref 0.2–1.2)
Total Protein: 7.2 g/dL (ref 6.0–8.3)

## 2023-04-28 LAB — LIPID PANEL
Cholesterol: 267 mg/dL — ABNORMAL HIGH (ref 0–200)
HDL: 63.6 mg/dL (ref >39.00)
LDL Cholesterol: 183 mg/dL — ABNORMAL HIGH (ref 0–99)
NonHDL: 203.15
Total CHOL/HDL Ratio: 4
Triglycerides: 101 mg/dL (ref 0.0–149.0)
VLDL: 20.2 mg/dL (ref 0.0–40.0)

## 2023-04-28 NOTE — Assessment & Plan Note (Signed)
-  Td 07-2017 -Pneumonia shot - 2012; prevnar 2015 -Vaccines I recommend: RSV, PNM 20, COVID, flu shot every fall -Cervical cancer screening: no further PAPs per guidelines, pt agrees -Breast cancer screening:  MMG 01-2023 ([facility]) -CCS: no FH; colonoscopy 2002 (-) ;   Colonoscopy 05/2011, colonoscopy 02/17/2018, C-scope 04/2022.  Next per GI -Labs: CMP FLP CBC TSH -Diet and exercise: Exercises twice a week at church.  Encouraged healthy diet -Fall prevention discussed. -Advance directives: POA on file

## 2023-04-28 NOTE — Assessment & Plan Note (Signed)
Here for CPX Hyperlipidemia: On Vytorin.  Labs Depression, insomnia: Well-controlled on alprazolam, sertraline. Essential tremor: On Inderal and primidone.  Seems to be very good today. Osteoporosis, on Fosamax, started 2021, to finish in 2026.  Vitamin D levels are normal.  She remains active. RTC 1 year.

## 2023-04-30 LAB — DRUG MONITORING PANEL 375977 , URINE
Alcohol Metabolites: NEGATIVE ng/mL (ref <500)
Alphahydroxyalprazolam: NEGATIVE ng/mL (ref <25)
Alphahydroxymidazolam: NEGATIVE ng/mL (ref <50)
Alphahydroxytriazolam: NEGATIVE ng/mL (ref <50)
Aminoclonazepam: NEGATIVE ng/mL (ref <25)
Amphetamines: NEGATIVE ng/mL (ref <500)
Barbiturates: NEGATIVE ng/mL (ref <300)
Benzodiazepines: NEGATIVE ng/mL (ref <100)
Cocaine Metabolite: NEGATIVE ng/mL (ref <150)
Desmethyltramadol: NEGATIVE ng/mL (ref <100)
Hydroxyethylflurazepam: NEGATIVE ng/mL (ref <50)
Lorazepam: NEGATIVE ng/mL (ref <50)
Marijuana Metabolite: NEGATIVE ng/mL (ref <20)
Nordiazepam: NEGATIVE ng/mL (ref <50)
Opiates: NEGATIVE ng/mL (ref <100)
Oxazepam: NEGATIVE ng/mL (ref <50)
Oxycodone: NEGATIVE ng/mL (ref <100)
Temazepam: NEGATIVE ng/mL (ref <50)
Tramadol: NEGATIVE ng/mL (ref <100)

## 2023-04-30 LAB — DM TEMPLATE

## 2023-05-02 ENCOUNTER — Telehealth: Payer: Self-pay

## 2023-05-02 DIAGNOSIS — E782 Mixed hyperlipidemia: Secondary | ICD-10-CM

## 2023-05-02 MED ORDER — EZETIMIBE-SIMVASTATIN 10-40 MG PO TABS: 1.0000 | ORAL_TABLET | Freq: Every day | ORAL | 1 refills | Status: DC

## 2023-05-02 NOTE — Telephone Encounter (Signed)
Patient called back about elevated cholesterol levels and to reports she has not been on Vytorin for about 3 months.

## 2023-05-02 NOTE — Telephone Encounter (Signed)
Rx sent. Orders placed. Mychart message sent to Pt to inform to restart Vytorin

## 2023-05-02 NOTE — Telephone Encounter (Signed)
Okay, advised patient: If there is a  reason  why she is not taking Vytorin (side effect?  cost?  etc.) let me know. Otherwise recommend to go back on it and check FLP AST ALT in 6 weeks.

## 2023-05-23 DIAGNOSIS — L718 Other rosacea: Secondary | ICD-10-CM | POA: Diagnosis not present

## 2023-05-23 DIAGNOSIS — L820 Inflamed seborrheic keratosis: Secondary | ICD-10-CM | POA: Diagnosis not present

## 2023-08-17 ENCOUNTER — Other Ambulatory Visit: Payer: Self-pay | Admitting: Internal Medicine

## 2023-12-20 ENCOUNTER — Telehealth: Payer: Self-pay | Admitting: Internal Medicine

## 2023-12-20 NOTE — Telephone Encounter (Signed)
Copied from CRM [id number]. Topic: Medicare AWV >> Dec 20, 2023 10:49 AM Payton Doughty wrote: Reason for CRM: Called LVM 12/20/2023 to schedule AWV. Please schedule Virtual or Telehealth visits ONLY.   Verlee Rossetti; Care Guide Ambulatory Clinical Support [place] [facility] Direct Dial: [phone]

## 2024-02-23 ENCOUNTER — Encounter: Payer: Self-pay | Admitting: Internal Medicine

## 2024-04-27 ENCOUNTER — Ambulatory Visit: Payer: Medicare HMO | Admitting: *Deleted

## 2024-04-27 ENCOUNTER — Encounter: Payer: Medicare HMO | Admitting: Internal Medicine

## 2024-04-27 VITALS — Ht 66.0 in | Wt 157.0 lb

## 2024-04-27 DIAGNOSIS — Z0000 Encounter for general adult medical examination without abnormal findings: Secondary | ICD-10-CM

## 2024-04-27 NOTE — Patient Instructions (Signed)
 Carol Wilson , Thank you for taking time out of your busy schedule to complete your Annual Wellness Visit with me. I enjoyed our conversation and look forward to speaking with you again next year. I, as well as your care team,  appreciate your ongoing commitment to your health goals. Please review the following plan we discussed and let me know if I can assist you in the future.  Your Game plan/ To Do List    Follow up Visits: Next Medicare AWV with our clinical staff: 04/30/25 9:40   Next Office Visit with your provider: 05/02/24 11:20am  Clinician Recommendations:  Aim for 30 minutes of exercise or brisk walking, 6-8 glasses of water, and 5 servings of fruits and vegetables each day.       This is a list of the screening recommended for you and due dates:  Health Maintenance  Topic Date Due   Zoster (Shingles) Vaccine (1 of 2) 03/28/1965   Flu Shot  06/22/2024   Medicare Annual Wellness Visit  04/27/2025   Colon Cancer Screening  05/13/2025   DTaP/Tdap/Td vaccine (3 - Tdap) 08/19/2027   Pneumonia Vaccine  Completed   DEXA scan (bone density measurement)  Completed   Hepatitis C Screening  Completed   HPV Vaccine  Aged Out   Meningitis B Vaccine  Aged Out   COVID-19 Vaccine  Discontinued    See attachments for Fall Prevention Tips.

## 2024-04-27 NOTE — Progress Notes (Signed)
 Subjective:   Carol Wilson is a 78 y.o. who presents for a Medicare Wellness preventive visit.  As a reminder, Annual Wellness Visits don't include a physical exam, and some assessments may be limited, especially if this visit is performed virtually. We may recommend an in-person follow-up visit with your provider if needed.  Visit Complete: Virtual I connected with  [name] on 04/27/24 by a audio enabled telemedicine application and verified that I am speaking with the correct person using two identifiers.  Patient Location: Home  Provider Location: Office/Clinic  I discussed the limitations of evaluation and management by telemedicine. The patient expressed understanding and agreed to proceed.  Vital Signs: Because this visit was a virtual/telehealth visit, some criteria may be missing or patient reported. Any vitals not documented were not able to be obtained and vitals that have been documented are patient reported.  VideoDeclined- This patient declined Librarian, academic. Therefore the visit was completed with audio only.  Persons Participating in Visit: Patient.  AWV Questionnaire: No: Patient Medicare AWV questionnaire was not completed prior to this visit.  Cardiac Risk Factors include: advanced age (>101men, >75 women);dyslipidemia     Objective:     Today's Vitals   04/27/24 0901  Weight: 157 lb (71.2 kg)  Height: 5\' 6"  (1.676 m)   Body mass index is 25.34 kg/m.     04/27/2024    9:11 AM 01/11/2023    2:20 PM 01/04/2022    2:26 PM 01/17/2020   11:02 AM 01/15/2019    8:11 AM 02/17/2018    8:40 AM 01/12/2018    9:13 AM  Advanced Directives  Does Patient Have a Medical Advance Directive? Yes Yes Yes Yes Yes No Yes  Type of Estate agent of [place];Living will Healthcare Power of [place];Living will Healthcare Power of [place];Living will Healthcare Power of [place];Living will Healthcare Power of  [place];Living will  Healthcare Power of [place];Living will  Does patient want to make changes to medical advance directive? No - Patient declined No - Patient declined  No - Patient declined No - Patient declined    Copy of Healthcare Power of Attorney in Chart? Yes - validated most recent copy scanned in chart (See row information) Yes - validated most recent copy scanned in chart (See row information) Yes - validated most recent copy scanned in chart (See row information) Yes - validated most recent copy scanned in chart (See row information) Yes - validated most recent copy scanned in chart (See row information)  No - copy requested  Would patient like information on creating a medical advance directive?      No - Patient declined     Current Medications (verified) Outpatient Encounter Medications as of 04/27/2024  Medication Sig   alendronate  (FOSAMAX ) 70 MG tablet Take 1 tablet (70 mg total) by mouth every 7 (seven) days. Take with a full glass of water on an empty stomach. Remain upright for 30-45 mins.   ALPRAZolam  (XANAX ) 0.5 MG tablet TAKE 1/2 TO 1 TABLET AT BEDTIME AS NEEDED FOR SLEEP   doxycycline  (VIBRA -TABS) 100 MG tablet Take 100 mg by mouth 2 (two) times daily. As needed for Rosacea   ezetimibe -simvastatin  (VYTORIN ) 10-40 MG tablet Take 1 tablet by mouth daily.   fluticasone  (FLONASE ) 50 MCG/ACT nasal spray Place 2 sprays into both nostrils daily.   metroNIDAZOLE  (METROGEL ) 1 % gel Apply 1 application topically daily.   Multiple Vitamins-Minerals (ONE-A-DAY 50 PLUS PO) Take by mouth.  primidone  (MYSOLINE ) 50 MG tablet Take 0.5 tablets (25 mg total) by mouth at bedtime.   propranolol  (INDERAL ) 10 MG tablet Take 1 tablet (10 mg total) by mouth daily.   sertraline  (ZOLOFT ) 100 MG tablet Take 1 tablet (100 mg total) by mouth daily.   No facility-administered encounter medications on file as of 04/27/2024.    Allergies (verified) Sulfa antibiotics   History: Past Medical  History:  Diagnosis Date   Anxiety    Colon polyps    Depression    started ~ 2008 after she lost a daughter   Familial tremor    HTN (hypertension)    well controlled with propranolol    Hyperlipidemia    Osteopenia    Rosacea    sees derm   Past Surgical History:  Procedure Laterality Date   APPENDECTOMY     COLONOSCOPY  02/17/2018   Dr.Armbruster   lenses implant B 2018 Bilateral 2018   POLYPECTOMY     TUBAL LIGATION     Family History  Problem Relation Age of Onset   Coronary artery disease Mother        M 58y/o and F 68y/o   Heart attack Father    Dementia Sister    Hyperlipidemia Sister    Osteoporosis Sister    Frontotemporal dementia Sister    Alcohol abuse Brother    Lung cancer Daughter    Tuberculosis Daughter    Diabetes Neg Hx    Hypertension Neg Hx    Stroke Neg Hx    Colon cancer Neg Hx    Breast cancer Neg Hx    Stomach cancer Neg Hx    Social History   Socioeconomic History   Marital status: Married    Spouse name: Not on file   Number of children: 2   Years of education: Not on file   Highest education level: GED or equivalent  Occupational History   Occupation: retired-- cooks for the school    Employer: [employer]  Tobacco Use   Smoking status: Never   Smokeless tobacco: Never  Vaping Use   Vaping status: Never Used  Substance and Sexual Activity   Alcohol use: Not Currently    Comment: occ wine   Drug use: No   Sexual activity: Not Currently  Other Topics Concern   Not on file  Social History Narrative   Very active   Household: pt, husband       lost her   daughter to lung cancer ~ 2008, lost a 13 old month baby years ago , no living children, takes care of  his Yuvonne Herald, 78 y/o   Social Drivers of Corporate investment banker Strain: Low Risk  (04/27/2024)   Overall Financial Resource Strain (CARDIA)    Difficulty of Paying Living Expenses: Not very hard  Food Insecurity: No Food Insecurity (04/27/2024)   Hunger Vital  Sign    Worried About Running Out of Food in the Last Year: Never true    Ran Out of Food in the Last Year: Never true  Transportation Needs: No Transportation Needs (04/27/2024)   PRAPARE - Administrator, Civil Service (Medical): No    Lack of Transportation (Non-Medical): No  Physical Activity: Insufficiently Active (04/27/2024)   Exercise Vital Sign    Days of Exercise per Week: 2 days    Minutes of Exercise per Session: 60 min  Stress: No Stress Concern Present (04/27/2024)   [employer] of Occupational Health - Occupational Stress  Questionnaire    Feeling of Stress : Only a little  Social Connections: Socially Integrated (04/27/2024)   Social Connection and Isolation Panel [NHANES]    Frequency of Communication with Friends and Family: More than three times a week    Frequency of Social Gatherings with Friends and Family: More than three times a week    Attends Religious Services: More than 4 times per year    Active Member of [employer] or Organizations: Yes    Attends Engineer, structural: More than 4 times per year    Marital Status: Married    Tobacco Counseling Counseling given: Not Answered    Clinical Intake:  Pre-visit preparation completed: Yes  Pain : No/denies pain     BMI - recorded: 25.34  No results found for: "HGBA1C"   How often do you need to have someone help you when you read instructions, pamphlets, or other written materials from your doctor or pharmacy?: 1 - Never What is the last grade level you completed in school?: GED  Interpreter Needed?: No  Information entered by :: Susa Engman, CMA   Activities of Daily Living     04/27/2024    9:07 AM  In your present state of health, do you have any difficulty performing the following activities:  Hearing? 0  Vision? 0  Difficulty concentrating or making decisions? 0  Walking or climbing stairs? 0  Dressing or bathing? 0  Doing errands, shopping? 0  Preparing Food and  eating ? N  Using the Toilet? N  In the past six months, have you accidently leaked urine? N  Do you have problems with loss of bowel control? N  Managing your Medications? N  Managing your Finances? N  Housekeeping or managing your Housekeeping? N    Patient Care Team: Ezell Hollow, MD as PCP - General (Internal Medicine) Tat, Von Grumbling, DO as Consulting Physician (Neurology)  I have updated your Care Teams any recent Medical Services you may have received from other providers in the past year.     Assessment:    This is a routine wellness examination for Mariko.  Hearing/Vision screen Hearing Screening - Comments:: Denies difficulty Vision Screening - Comments:: Denies difficulty   Goals Addressed             This Visit's Progress    Maintain healthy active lifestyle.   On track      Depression Screen     04/27/2024    9:10 AM 04/27/2023   10:06 AM 01/11/2023    2:23 PM 04/21/2022    9:54 AM 01/04/2022    2:29 PM 06/16/2021   10:41 AM 02/19/2021    8:44 AM  PHQ 2/9 Scores  PHQ - 2 Score 0 0 0 0 0 0 0  PHQ- 9 Score      3 4    Fall Risk     04/27/2024    9:06 AM 04/27/2023   10:05 AM 01/11/2023    2:21 PM 04/21/2022    9:54 AM 01/04/2022    2:27 PM  Fall Risk   Falls in the past year? 0 0 0 0 0  Number falls in past yr: 0 0 0 0 0  Injury with Fall? 0 0 0 0 0  Risk for fall due to : No Fall Risks  No Fall Risks    Follow up Education provided Falls evaluation completed Falls evaluation completed Falls evaluation completed Falls prevention discussed    MEDICARE RISK  AT HOME:  Medicare Risk at Home Any stairs in or around the home?: Yes If so, are there any without handrails?: No Home free of loose throw rugs in walkways, pet beds, electrical cords, etc?: Yes Adequate lighting in your home to reduce risk of falls?: Yes Life alert?: No Use of a cane, walker or w/c?: No Grab bars in the bathroom?: No Shower chair or bench in shower?: No Elevated toilet seat or a  handicapped toilet?: No  TIMED UP AND GO:  Was the test performed?  No, audio  Cognitive Function: 6CIT completed    01/12/2018    9:14 AM 01/10/2017   10:09 AM  MMSE - Mini Mental State Exam  Orientation to time 5 5  Orientation to Place 5 5  Registration 3 3  Attention/ Calculation 5 5  Recall 3 1  Language- name 2 objects 2 2  Language- repeat 1 1  Language- follow 3 step command 3 3  Language- read & follow direction 1 1  Write a sentence 1 1  Copy design 1 1  Total score 30 28        04/27/2024    9:16 AM 01/11/2023    2:27 PM  6CIT Screen  What Year? 0 points 0 points  What month? 0 points 0 points  What time? 0 points 0 points  Count back from 20 0 points 0 points  Months in reverse 0 points 0 points  Repeat phrase 0 points 2 points  Total Score 0 points 2 points    Immunizations Immunization History  Administered Date(s) Administered   Fluad Quad(high Dose 65+) 08/28/2020   H1N1 11/07/2008   Influenza Whole 08/22/2009   Influenza, High Dose Seasonal PF 08/16/2013, 08/13/2016, 08/18/2017, 08/16/2018, 08/22/2019   Influenza,inj,Quad PF,6+ Mos 08/06/2014, 08/13/2015   Pneumococcal Conjugate-13 08/06/2014   Pneumococcal Polysaccharide-23 06/08/2011   Td 02/22/2007, 08/18/2017   Zoster, Live 12/15/2009    Screening Tests Health Maintenance  Topic Date Due   Zoster Vaccines- Shingrix  (1 of 2) 03/28/1965   Medicare Annual Wellness (AWV)  01/12/2024   INFLUENZA VACCINE  06/22/2024   Colonoscopy  05/13/2025   DTaP/Tdap/Td (3 - Tdap) 08/19/2027   Pneumonia Vaccine 70+ Years old  Completed   DEXA SCAN  Completed   Hepatitis C Screening  Completed   HPV VACCINES  Aged Out   Meningococcal B Vaccine  Aged Out   COVID-19 Vaccine  Discontinued    Health Maintenance  Health Maintenance Due  Topic Date Due   Zoster Vaccines- Shingrix  (1 of 2) 03/28/1965   Medicare Annual Wellness (AWV)  01/12/2024   Health Maintenance Items Addressed: Pt will get 2nd  shingrix  at local pharmacy if she decides to complete the series.  Additional Screening:  Vision Screening: Recommended annual ophthalmology exams for early detection of glaucoma and other disorders of the eye. Would you like a referral to an eye doctor? No    Dental Screening: Recommended annual dental exams for proper oral hygiene  Community Resource Referral / Chronic Care Management: CRR required this visit?  No   CCM required this visit?  No   Plan:    I have personally reviewed and noted the following in the patient's chart:   Medical and social history Use of alcohol, tobacco or illicit drugs  Current medications and supplements including opioid prescriptions. Patient is not currently taking opioid prescriptions. Functional ability and status Nutritional status Physical activity Advanced directives List of other physicians Hospitalizations, surgeries, and ER visits  in previous 12 months Vitals Screenings to include cognitive, depression, and falls Referrals and appointments  In addition, I have reviewed and discussed with patient certain preventive protocols, quality metrics, and best practice recommendations. A written personalized care plan for preventive services as well as general preventive health recommendations were provided to patient.   Susa Engman, CMA   04/27/2024   After Visit Summary: (MyChart) Due to this being a telephonic visit, the after visit summary with patients personalized plan was offered to patient via MyChart   Notes: Nothing significant to report at this time.

## 2024-05-02 ENCOUNTER — Telehealth (HOSPITAL_BASED_OUTPATIENT_CLINIC_OR_DEPARTMENT_OTHER): Payer: Self-pay

## 2024-05-02 ENCOUNTER — Encounter: Payer: Self-pay | Admitting: Internal Medicine

## 2024-05-02 ENCOUNTER — Ambulatory Visit (INDEPENDENT_AMBULATORY_CARE_PROVIDER_SITE_OTHER): Admitting: Internal Medicine

## 2024-05-02 VITALS — BP 130/66 | HR 59 | Temp 98.6°F | Resp 16 | Ht 66.0 in | Wt 152.4 lb

## 2024-05-02 DIAGNOSIS — Z0000 Encounter for general adult medical examination without abnormal findings: Secondary | ICD-10-CM | POA: Diagnosis not present

## 2024-05-02 DIAGNOSIS — Z2821 Immunization not carried out because of patient refusal: Secondary | ICD-10-CM

## 2024-05-02 DIAGNOSIS — E782 Mixed hyperlipidemia: Secondary | ICD-10-CM

## 2024-05-02 DIAGNOSIS — Z1231 Encounter for screening mammogram for malignant neoplasm of breast: Secondary | ICD-10-CM

## 2024-05-02 MED ORDER — ALPRAZOLAM 0.5 MG PO TABS: 0.2500 mg | ORAL_TABLET | Freq: Every evening | ORAL | 0 refills | Status: AC | PRN

## 2024-05-02 MED ORDER — ATORVASTATIN CALCIUM 40 MG PO TABS: 40.0000 mg | ORAL_TABLET | Freq: Every day | ORAL | 1 refills | Status: DC

## 2024-05-02 NOTE — Progress Notes (Signed)
 Subjective:    Patient ID: Carol Wilson, female    DOB: 1946/05/06, 78 y.o.   MRN: [id number]  DOS:  05/02/2024 Type of visit - description: CPX  Here for CPX. Feeling well.  Review of Systems   A 14 point review of systems is negative    Past Medical History:  Diagnosis Date   Anxiety    Colon polyps    Depression    started ~ 2008 after she lost a daughter   Familial tremor    HTN (hypertension)    well controlled with propranolol    Hyperlipidemia    Osteopenia    Rosacea    sees derm    Past Surgical History:  Procedure Laterality Date   APPENDECTOMY     COLONOSCOPY  02/17/2018   Dr.Armbruster   lenses implant B 2018 Bilateral 2018   POLYPECTOMY     TUBAL LIGATION     Social History   Social History Narrative   Very active   Household: pt, husband       lost her   daughter to lung cancer ~ 2008, lost a 9 old month baby years ago , no living children, takes care of  his Yuvonne Herald, 78 y/o     Current Outpatient Medications  Medication Instructions   alendronate  (FOSAMAX ) 70 mg, Oral, Every 7 days, Take with a full glass of water on an empty stomach. Remain upright for 30-45 mins.   ALPRAZolam  (XANAX ) 0.5 MG tablet TAKE 1/2 TO 1 TABLET AT BEDTIME AS NEEDED FOR SLEEP   doxycycline  (VIBRA -TABS) 100 mg, 2 times daily   ezetimibe -simvastatin  (VYTORIN ) 10-40 MG tablet 1 tablet, Oral, Daily   fluticasone  (FLONASE ) 50 MCG/ACT nasal spray 2 sprays, Each Nare, Daily   metroNIDAZOLE  (METROGEL ) 1 % gel 1 application , Topical, Daily   Multiple Vitamins-Minerals (ONE-A-DAY 50 PLUS PO) Take by mouth.   primidone  (MYSOLINE ) 25 mg, Oral, Daily at bedtime   propranolol  (INDERAL ) 10 mg, Oral, Daily   sertraline  (ZOLOFT ) 100 mg, Oral, Daily       Objective:   Physical Exam BP 130/66   Pulse (!) 59   Temp 98.6 F (37 C) (Oral)   Resp 16   Ht 5' 6 (1.676 m)   Wt 152 lb 6 oz (69.1 kg)   SpO2 96%   BMI 24.59 kg/m  General: Well developed, NAD, BMI  noted Neck: No  thyromegaly  HEENT:  Normocephalic . Face symmetric, atraumatic Lungs:  CTA B Normal respiratory effort, no intercostal retractions, no accessory muscle use. Heart: RRR,  no murmur.  Abdomen:  Not distended, soft, non-tender. No rebound or rigidity.   Lower extremities: no pretibial edema bilaterally  Skin: Exposed areas without rash. Not pale. Not jaundice Neurologic:  alert & oriented X3.  Speech normal, gait appropriate for age and unassisted Strength symmetric and appropriate for age. Mild tremors noted.  At baseline Psych: Cognition and judgment appear intact.  Cooperative with normal attention span and concentration.  Behavior appropriate. No anxious or depressed appearing.     Assessment     ASSESSMENT Hyperlipidemia  Depression , insomnia --onset 2008, lost a daughter Neurology: -Essential tremor- Dr Tat -Hyper reflexion Osteoporosis dexa: 2012, 2014, 01/14/2016: T score -0.7. 11/-2021 T score  (-) 2.4, rx fosamax   Rosacea   Sees dermatology Allergic rhinitis  Chronic Diarrhea: Saw GI, celiac w/u negative,  colonoscopy 01-2018, had polyps, biopsy was negative for colitis, trial with Colestid  failed.   +FH CAD  PLAN: Here  for CPX -Td 07-2017 -Pneumonia shot - 2012; prevnar 2015 -Vaccines I recommend: RSV, PNM 20, COVID, flu shot every fall (declined all) -Cervical cancer screening: no further PAPs per guidelines, pt agrees -Breast cancer screening:  MMG 01-2023 ([facility]), plans to proceed w/ f/u  -CCS: no FH; colonoscopy 2002 (-) ;   Colonoscopy 05/2011, colonoscopy 02/17/2018, C-scope 04/2022.  No f/u d/t age  -Labs:  CMP FLP CBC -Diet and exercise: Reports she remains active - Healthcare POA information provided.  Other issues addressed  High cholesterol: Last LDL very high, reports she d/c Vytorin  a year ago due to aches and pains however in the last month has taken atorvastatin 40 mg from her husband with no side effects. Plan: Prescribed  atorvastatin, labs today, reassess in 6 months. Osteoporosis, T-score 2021 (-)2.4.  Reports she has been taking Fosamax  every week despite not getting refills.  Plan: Check DEXA.(Once our machine is repaired). Depression, insomnia: Controlled on sertraline .  Uses Xanax  very sporadically, request a refill.  PDMP okay, RF sent. Essential tremor: Per neuro RTC 6 months

## 2024-05-02 NOTE — Patient Instructions (Addendum)
 Vaccines I recommend: RSV Pneumonia shot (PNM 20) COVID booster Flu shot every fall  Please arrange for your next mammogram.  Will call you when we are able to do your bone density test  Continue atorvastatin 40 mg every day.  We are sending a prescription.   GO TO THE LAB :  Get the blood work   Your results will be posted on MyChart with my comments  Next office visit for a checkup in 6 months Please make an appointment before you leave today        Health Care Power of attorney (Also know as a  Living will or  Advance care planning documents)  If you already have a living will or healthcare power of attorney, is recommended you bring the copy to be scanned in your chart.   The document will be available to all the doctors you see in the system.  If you are over 66 y/o and don't have the document, please read:  Advance care planning is a process that supports adults in  understanding and sharing their preferences regarding future medical care.  The patient's preferences are recorded in documents called Advance Directives and the can be modified at any time while the patient is in full mental capacity.     More information at: [email or web address]

## 2024-05-03 ENCOUNTER — Encounter: Payer: Self-pay | Admitting: Internal Medicine

## 2024-05-03 LAB — CBC WITH DIFFERENTIAL/PLATELET
Absolute Lymphocytes: 1539 {cells}/uL (ref 850–3900)
Absolute Monocytes: 397 {cells}/uL (ref 200–950)
Basophils Absolute: 39 {cells}/uL (ref 0–200)
Basophils Relative: 0.8 %
Eosinophils Absolute: 98 {cells}/uL (ref 15–500)
Eosinophils Relative: 2 %
HCT: 43.1 % (ref 35.0–45.0)
Hemoglobin: 13.2 g/dL (ref 11.7–15.5)
MCH: 26.2 pg — ABNORMAL LOW (ref 27.0–33.0)
MCHC: 30.6 g/dL — ABNORMAL LOW (ref 32.0–36.0)
MCV: 85.5 fL (ref 80.0–100.0)
MPV: 9.7 fL (ref 7.5–12.5)
Monocytes Relative: 8.1 %
Neutro Abs: 2827 {cells}/uL (ref 1500–7800)
Neutrophils Relative %: 57.7 %
Platelets: 284 10*3/uL (ref 140–400)
RBC: 5.04 10*6/uL (ref 3.80–5.10)
RDW: 13.5 % (ref 11.0–15.0)
Total Lymphocyte: 31.4 %
WBC: 4.9 10*3/uL (ref 3.8–10.8)

## 2024-05-03 LAB — COMPREHENSIVE METABOLIC PANEL WITH GFR
AG Ratio: 1.9 (calc) (ref 1.0–2.5)
ALT: 12 U/L (ref 6–29)
AST: 19 U/L (ref 10–35)
Albumin: 4.5 g/dL (ref 3.6–5.1)
Alkaline phosphatase (APISO): 94 U/L (ref 37–153)
BUN: 10 mg/dL (ref 7–25)
CO2: 28 mmol/L (ref 20–32)
Calcium: 10.6 mg/dL — ABNORMAL HIGH (ref 8.6–10.4)
Chloride: 105 mmol/L (ref 98–110)
Creat: 0.92 mg/dL (ref 0.60–1.00)
Globulin: 2.4 g/dL (ref 1.9–3.7)
Glucose, Bld: 100 mg/dL — ABNORMAL HIGH (ref 65–99)
Potassium: 4.9 mmol/L (ref 3.5–5.3)
Sodium: 139 mmol/L (ref 135–146)
Total Bilirubin: 0.6 mg/dL (ref 0.2–1.2)
Total Protein: 6.9 g/dL (ref 6.1–8.1)
eGFR: 64 mL/min/{1.73_m2} (ref >=60)

## 2024-05-03 LAB — LIPID PANEL
Cholesterol: 178 mg/dL (ref <200)
HDL: 64 mg/dL (ref >=50)
LDL Cholesterol (Calc): 95 mg/dL
Non-HDL Cholesterol (Calc): 114 mg/dL (ref <130)
Total CHOL/HDL Ratio: 2.8 (calc) (ref <5.0)
Triglycerides: 99 mg/dL (ref <150)

## 2024-05-03 NOTE — Assessment & Plan Note (Signed)
 Here for CPX   Other issues addressed  High cholesterol: Last LDL very high, reports she d/c Vytorin  a year ago due to aches and pains however in the last month has taken atorvastatin 40 mg from her husband with no side effects. Plan: Prescribed atorvastatin, labs today, reassess in 6 months. Osteoporosis, T-score 2021 (-)2.4.  Reports she has been taking Fosamax  every week despite not getting refills.  Plan: Check DEXA.(Once our machine is repaired). Depression, insomnia: Controlled on sertraline .  Uses Xanax  very sporadically, request a refill.  PDMP okay, RF sent. Essential tremor: Per neuro RTC 6 months

## 2024-05-03 NOTE — Assessment & Plan Note (Signed)
 Here for CPX -Td 07-2017 -Pneumonia shot - 2012; prevnar 2015 -Vaccines I recommend: RSV, PNM 20, COVID, flu shot every fall (declined all) -Cervical cancer screening: no further PAPs per guidelines, pt agrees -Breast cancer screening:  MMG 01-2023 ([facility]), plans to proceed w/ f/u  -CCS: no FH; colonoscopy 2002 (-) ;   Colonoscopy 05/2011, colonoscopy 02/17/2018, C-scope 04/2022.  No f/u d/t age  -Labs:  CMP FLP CBC -Diet and exercise: Reports she remains active - Healthcare POA information provided.

## 2024-05-07 ENCOUNTER — Ambulatory Visit: Payer: Self-pay | Admitting: Internal Medicine

## 2024-05-28 ENCOUNTER — Telehealth: Payer: Self-pay

## 2024-05-28 ENCOUNTER — Telehealth (HOSPITAL_BASED_OUTPATIENT_CLINIC_OR_DEPARTMENT_OTHER): Payer: Self-pay

## 2024-05-28 DIAGNOSIS — M810 Age-related osteoporosis without current pathological fracture: Secondary | ICD-10-CM

## 2024-05-28 NOTE — Telephone Encounter (Signed)
 Last bone density 2021. Okay to order?

## 2024-05-28 NOTE — Telephone Encounter (Signed)
 Yes please

## 2024-05-28 NOTE — Telephone Encounter (Signed)
 Order placed

## 2024-05-28 NOTE — Telephone Encounter (Signed)
 Copied from CRM [id number]. Topic: Clinical - Request for Lab/Test Order >> May 28, 2024  9:06 AM Rosina BIRCH wrote: Reason for CRM: patient called stating she called to make an appointment for a bone density test and she was told MD Amon had to make an order for it CB [phone]

## 2024-06-06 ENCOUNTER — Other Ambulatory Visit: Payer: Self-pay | Admitting: Internal Medicine

## 2024-08-16 ENCOUNTER — Ambulatory Visit (HOSPITAL_BASED_OUTPATIENT_CLINIC_OR_DEPARTMENT_OTHER)
Admission: RE | Admit: 2024-08-16 | Discharge: 2024-08-16 | Disposition: A | Discharge status: Home / Self Care | Source: Ambulatory Visit | Attending: Internal Medicine | Admitting: Internal Medicine

## 2024-08-16 ENCOUNTER — Encounter (HOSPITAL_BASED_OUTPATIENT_CLINIC_OR_DEPARTMENT_OTHER): Payer: Self-pay

## 2024-08-16 DIAGNOSIS — M810 Age-related osteoporosis without current pathological fracture: Secondary | ICD-10-CM | POA: Insufficient documentation

## 2024-08-16 DIAGNOSIS — Z1231 Encounter for screening mammogram for malignant neoplasm of breast: Secondary | ICD-10-CM | POA: Diagnosis not present

## 2024-08-16 DIAGNOSIS — Z780 Asymptomatic menopausal state: Secondary | ICD-10-CM | POA: Diagnosis not present

## 2024-08-18 ENCOUNTER — Ambulatory Visit: Payer: Self-pay | Admitting: Internal Medicine

## 2024-08-18 DIAGNOSIS — M810 Age-related osteoporosis without current pathological fracture: Secondary | ICD-10-CM

## 2024-08-20 ENCOUNTER — Telehealth: Payer: Self-pay

## 2024-08-20 MED ORDER — DENOSUMAB 60 MG/ML ~~LOC~~ SOSY
60.0000 mg | PREFILLED_SYRINGE | Freq: Once | SUBCUTANEOUS | Status: AC
  Administered 2024-08-28: 60 mg via SUBCUTANEOUS

## 2024-08-20 NOTE — Telephone Encounter (Signed)
 Prolia  VOB initiated via [email or web address]  Next Prolia  inj DUE: [facility]

## 2024-08-21 ENCOUNTER — Other Ambulatory Visit (HOSPITAL_COMMUNITY): Payer: Self-pay

## 2024-08-21 NOTE — Telephone Encounter (Signed)
 Pt ready for scheduling for PROLIA on or after : 08/22/24  Option# 1: Buy/Bill (Office supplied medication)  Out-of-pocket cost due at time of clinic visit: $347  Number of injection/visits approved: 2  Primary: [employer] Prolia co-insurance: 20% Admin fee co-insurance: $15  Secondary: --- Prolia co-insurance:  Admin fee co-insurance:   Medical Benefit Details: Date Benefits were checked: 08/20/24 Deductible: NO/ Coinsurance: 20%/ Admin Fee: $15  Prior Auth: APPROVED PA# [id number] Expiration Date: 08/22/24-11/21/24  # of doses approved: 2 ----------------------------------------------------------------------- Option# 2- Med Obtained from pharmacy:  Pharmacy benefit: Copay $906.45 (Paid to pharmacy) Admin Fee: $15 (Pay at clinic)  Prior Auth: N/A PA# Expiration Date:   # of doses approved:   If patient wants fill through the pharmacy benefit please send prescription to: [facility], and include estimated need by date in rx notes. Pharmacy will ship medication directly to the office.  Patient NOT eligible for Prolia Copay Card. Copay Card can make patient's cost as little as $25. Link to apply: [email or web address]://[email or web address]  ** This summary of benefits is an estimation of the patient's out-of-pocket cost. Exact cost may very based on individual plan coverage.

## 2024-08-21 NOTE — Telephone Encounter (Signed)
 SABRA

## 2024-08-21 NOTE — Telephone Encounter (Addendum)
 BUY AND BILL PA SUBMITTED VIA LATENT. KEY: [id number]     PHARMACY COPAY: $ 906.45

## 2024-08-28 ENCOUNTER — Ambulatory Visit (INDEPENDENT_AMBULATORY_CARE_PROVIDER_SITE_OTHER)

## 2024-08-28 DIAGNOSIS — M810 Age-related osteoporosis without current pathological fracture: Secondary | ICD-10-CM

## 2024-08-28 MED ORDER — DENOSUMAB 60 MG/ML ~~LOC~~ SOSY: 60.0000 mg | PREFILLED_SYRINGE | SUBCUTANEOUS | Status: AC

## 2024-08-28 NOTE — Progress Notes (Signed)
 Pt was in office today for initial Prolia injection, Injection was given subcutaneous on L arm. Pt tolerated well. Pt was advised she will receive a phone call to schedule next injection, pt is aware and expressed understanding.

## 2024-10-07 ENCOUNTER — Other Ambulatory Visit: Payer: Self-pay | Admitting: Internal Medicine

## 2024-10-31 ENCOUNTER — Other Ambulatory Visit: Payer: Self-pay | Admitting: Internal Medicine

## 2024-11-02 ENCOUNTER — Ambulatory Visit: Admitting: Internal Medicine

## 2024-12-14 ENCOUNTER — Ambulatory Visit: Admitting: Internal Medicine

## 2024-12-14 VITALS — BP 134/80 | HR 54 | Temp 97.7°F | Resp 16 | Ht 66.0 in | Wt 155.0 lb

## 2024-12-14 DIAGNOSIS — M810 Age-related osteoporosis without current pathological fracture: Secondary | ICD-10-CM

## 2024-12-14 DIAGNOSIS — G4700 Insomnia, unspecified: Secondary | ICD-10-CM

## 2024-12-14 DIAGNOSIS — E782 Mixed hyperlipidemia: Secondary | ICD-10-CM

## 2024-12-14 DIAGNOSIS — Z79899 Other long term (current) drug therapy: Secondary | ICD-10-CM

## 2024-12-14 NOTE — Patient Instructions (Signed)
 Please read your instructions carefully.    Go to the front desk for the checkout Please make an appointment  by June 2026

## 2024-12-14 NOTE — Progress Notes (Signed)
 "  Subjective:    Patient ID: Carol Wilson, female    DOB: 1945-12-02, 79 y.o.   MRN: [id number]  DOS:  12/14/2024 Follow-up  Discussed the use of AI scribe software for clinical note transcription with the patient, who gave verbal consent to proceed.  History of Present Illness Carol Wilson is a 79 year old female who presents for a routine follow-up visit.  General health status - No new health issues over the past six months - Feels well overall  Cardiac murmur ? - Heart murmur identified in May during an insurance wellness test  Medication use - Takes atorvastatin , propranolol , primidone , and Prolia  regularly - Uses Xanax  as needed for anxiety and difficulty sleeping - Xanax  is effective when unable to fall asleep due to racing thoughts  Caregiver stress - Primary caregiver for sister with severe dementia - Increased caregiving responsibilities and stress since sister returned home from assisted living - Challenging behaviors from sister contribute to stress   Review of Systems See above   Past Medical History:  Diagnosis Date   Anxiety    Colon polyps    Depression    started ~ 2008 after she lost a daughter   Familial tremor    HTN (hypertension)    well controlled with propranolol    Hyperlipidemia    Osteopenia    Rosacea    sees derm    Past Surgical History:  Procedure Laterality Date   APPENDECTOMY     COLONOSCOPY  02/17/2018   Dr.Armbruster   lenses implant B 2018 Bilateral 2018   POLYPECTOMY     TUBAL LIGATION      Current Outpatient Medications  Medication Instructions   ALPRAZolam  (XANAX ) 0.25-0.5 mg, Oral, At bedtime PRN   atorvastatin  (LIPITOR) 40 mg, Oral, Daily at bedtime   fluticasone  (FLONASE ) 50 MCG/ACT nasal spray 2 sprays, Each Nare, Daily   metroNIDAZOLE  (METROGEL ) 1 % gel 1 application , Topical, Daily   Multiple Vitamins-Minerals (ONE-A-DAY 50 PLUS PO) Take by mouth.   primidone  (MYSOLINE ) 25 mg, Oral, Daily at bedtime    propranolol  (INDERAL ) 10 mg, Oral, Daily   sertraline  (ZOLOFT ) 100 mg, Oral, Daily       Objective:   Physical Exam BP 134/80   Pulse (!) 54   Temp 97.7 F (36.5 C) (Oral)   Resp 16   Ht 5' 6 (1.676 m)   Wt 155 lb (70.3 kg)   SpO2 96%   BMI 25.02 kg/m  General:   Well developed, NAD, BMI noted. HEENT:  Normocephalic . Face symmetric, atraumatic Lungs:  CTA B Normal respiratory effort, no intercostal retractions, no accessory muscle use. Heart: RRR,  no murmur.  Lower extremities: no pretibial edema bilaterally  Skin: Not pale. Not jaundice Neurologic:  alert & oriented X3.  Speech normal, gait appropriate for age and unassisted Psych--  Cognition and judgment appear intact.  Cooperative with normal attention span and concentration.  Behavior appropriate. No anxious or depressed appearing.      Assessment   ASSESSMENT Hyperlipidemia  Depression , insomnia --onset 2008, lost a daughter Neurology: -Essential tremor- Dr Tat -Hyper reflexion Osteoporosis dexa: 2012, 2014, 01/14/2016: T score -0.7. 11/-2021 T score  (-) 2.4, rx fosamax   Rosacea   Sees dermatology Allergic rhinitis  Chronic Diarrhea: Saw GI, celiac w/u negative,  colonoscopy 01-2018, had polyps, biopsy was negative for colitis, trial with Colestid  failed.   +FH CAD   Assessment & Plan Hyperlipidemia Well-managed with atorvastatin .  No change.  Osteoporosis Was switched from Fosamax  to Prolia , next Prolia  injection around April 2026. Essential tremor Managed with propranolol  and primidone .  Seems well-controlled Depression, insomnia: On sertraline  and Xanax .  She is under stress related to taking care of her sister who has dementia.  Listening therapy provided.  No change for now. Check UDS. Heart murmur?  During insurance visit, was told that she probably had a heart murmur, exam is negative today Vaccine advice: Has consistently declined vaccines and declines again today. RTC June 2026  CPX.       "

## 2024-12-15 NOTE — Assessment & Plan Note (Signed)
 Hyperlipidemia Well-managed with atorvastatin .  No change. Osteoporosis Was switched from Fosamax  to Prolia , next Prolia  injection around April 2026. Essential tremor Managed with propranolol  and primidone .  Seems well-controlled Depression, insomnia: On sertraline  and Xanax .  She is under stress related to taking care of her sister who has dementia.  Listening therapy provided.  No change for now. Check UDS. Heart murmur?  During insurance visit, was told that she probably had a heart murmur, exam is negative today Vaccine advice: Has consistently declined vaccines and declines again today. RTC June 2026 CPX.

## 2024-12-17 LAB — DM TEMPLATE

## 2024-12-17 LAB — DRUG MONITORING PANEL 375977 , URINE
Alcohol Metabolites: NEGATIVE ng/mL
Amphetamines: NEGATIVE ng/mL
Barbiturates: NEGATIVE ng/mL
Benzodiazepines: NEGATIVE ng/mL
Cocaine Metabolite: NEGATIVE ng/mL
Desmethyltramadol: NEGATIVE ng/mL
Marijuana Metabolite: NEGATIVE ng/mL
Opiates: NEGATIVE ng/mL
Oxycodone: NEGATIVE ng/mL
Tramadol: NEGATIVE ng/mL

## 2024-12-18 ENCOUNTER — Ambulatory Visit: Payer: Self-pay | Admitting: Internal Medicine

## 2025-04-30 ENCOUNTER — Ambulatory Visit

## 2025-05-14 ENCOUNTER — Encounter: Admitting: Internal Medicine
